# Patient Record
Sex: Male | Born: 1935
Health system: Southern US, Community
[De-identification: ages and names within clinical notes are randomized; demographics above are authoritative.]

## PROBLEM LIST (undated history)

## (undated) DIAGNOSIS — E7800 Pure hypercholesterolemia, unspecified: Secondary | ICD-10-CM

## (undated) DIAGNOSIS — C649 Malignant neoplasm of unspecified kidney, except renal pelvis: Secondary | ICD-10-CM

## (undated) DIAGNOSIS — I639 Cerebral infarction, unspecified: Secondary | ICD-10-CM

## (undated) DIAGNOSIS — K219 Gastro-esophageal reflux disease without esophagitis: Secondary | ICD-10-CM

## (undated) DIAGNOSIS — C801 Malignant (primary) neoplasm, unspecified: Secondary | ICD-10-CM

## (undated) DIAGNOSIS — C679 Malignant neoplasm of bladder, unspecified: Secondary | ICD-10-CM

## (undated) DIAGNOSIS — I10 Essential (primary) hypertension: Secondary | ICD-10-CM

## (undated) HISTORY — PX: NEPHRECTOMY: SHX65

---

## 1998-03-30 ENCOUNTER — Ambulatory Visit (HOSPITAL_COMMUNITY): Admission: RE | Admit: 1998-03-30 | Discharge: 1998-03-30 | Payer: Self-pay | Admitting: Urology

## 1998-03-30 ENCOUNTER — Encounter: Payer: Self-pay | Admitting: Urology

## 1998-05-18 ENCOUNTER — Inpatient Hospital Stay (HOSPITAL_COMMUNITY): Admission: RE | Admit: 1998-05-18 | Discharge: 1998-05-24 | Payer: Self-pay | Admitting: Urology

## 1998-05-24 ENCOUNTER — Encounter: Payer: Self-pay | Admitting: Urology

## 1998-11-02 ENCOUNTER — Encounter: Payer: Self-pay | Admitting: Urology

## 1998-11-02 ENCOUNTER — Ambulatory Visit (HOSPITAL_COMMUNITY): Admission: RE | Admit: 1998-11-02 | Discharge: 1998-11-02 | Payer: Self-pay | Admitting: Urology

## 1998-11-02 ENCOUNTER — Encounter (INDEPENDENT_AMBULATORY_CARE_PROVIDER_SITE_OTHER): Payer: Self-pay

## 1999-05-03 ENCOUNTER — Encounter (INDEPENDENT_AMBULATORY_CARE_PROVIDER_SITE_OTHER): Payer: Self-pay

## 1999-05-03 ENCOUNTER — Ambulatory Visit (HOSPITAL_COMMUNITY): Admission: RE | Admit: 1999-05-03 | Discharge: 1999-05-03 | Payer: Self-pay | Admitting: Hematology and Oncology

## 1999-05-03 ENCOUNTER — Encounter: Payer: Self-pay | Admitting: Urology

## 1999-07-27 ENCOUNTER — Other Ambulatory Visit: Admission: RE | Admit: 1999-07-27 | Discharge: 1999-07-27 | Payer: Self-pay | Admitting: Urology

## 1999-11-15 ENCOUNTER — Encounter (INDEPENDENT_AMBULATORY_CARE_PROVIDER_SITE_OTHER): Payer: Self-pay | Admitting: Specialist

## 1999-11-15 ENCOUNTER — Ambulatory Visit (HOSPITAL_COMMUNITY): Admission: RE | Admit: 1999-11-15 | Discharge: 1999-11-15 | Payer: Self-pay | Admitting: Urology

## 2000-10-02 ENCOUNTER — Encounter (INDEPENDENT_AMBULATORY_CARE_PROVIDER_SITE_OTHER): Payer: Self-pay

## 2000-10-02 ENCOUNTER — Encounter: Payer: Self-pay | Admitting: Urology

## 2000-10-02 ENCOUNTER — Ambulatory Visit (HOSPITAL_COMMUNITY): Admission: RE | Admit: 2000-10-02 | Discharge: 2000-10-02 | Payer: Self-pay | Admitting: Urology

## 2001-11-04 ENCOUNTER — Encounter: Payer: Self-pay | Admitting: Urology

## 2001-11-04 ENCOUNTER — Encounter: Admission: RE | Admit: 2001-11-04 | Discharge: 2001-11-04 | Payer: Self-pay | Admitting: Urology

## 2004-06-02 ENCOUNTER — Emergency Department (HOSPITAL_COMMUNITY): Admission: EM | Admit: 2004-06-02 | Discharge: 2004-06-02 | Payer: Self-pay | Admitting: Emergency Medicine

## 2004-06-06 ENCOUNTER — Ambulatory Visit (HOSPITAL_COMMUNITY): Admission: RE | Admit: 2004-06-06 | Discharge: 2004-06-06 | Payer: Self-pay | Admitting: Emergency Medicine

## 2004-07-06 ENCOUNTER — Ambulatory Visit: Payer: Self-pay | Admitting: Internal Medicine

## 2004-08-26 ENCOUNTER — Ambulatory Visit: Payer: Self-pay | Admitting: Internal Medicine

## 2005-02-21 ENCOUNTER — Ambulatory Visit (HOSPITAL_BASED_OUTPATIENT_CLINIC_OR_DEPARTMENT_OTHER): Admission: RE | Admit: 2005-02-21 | Discharge: 2005-02-21 | Payer: Self-pay | Admitting: Urology

## 2005-02-21 ENCOUNTER — Encounter (INDEPENDENT_AMBULATORY_CARE_PROVIDER_SITE_OTHER): Payer: Self-pay | Admitting: Specialist

## 2005-12-09 ENCOUNTER — Inpatient Hospital Stay (HOSPITAL_COMMUNITY): Admission: EM | Admit: 2005-12-09 | Discharge: 2005-12-14 | Payer: Self-pay | Admitting: Emergency Medicine

## 2005-12-09 ENCOUNTER — Ambulatory Visit: Payer: Self-pay | Admitting: Pulmonary Disease

## 2005-12-10 ENCOUNTER — Encounter: Payer: Self-pay | Admitting: Gastroenterology

## 2005-12-18 ENCOUNTER — Ambulatory Visit: Payer: Self-pay | Admitting: Gastroenterology

## 2005-12-26 ENCOUNTER — Ambulatory Visit: Payer: Self-pay | Admitting: Gastroenterology

## 2006-03-28 ENCOUNTER — Ambulatory Visit: Payer: Self-pay | Admitting: Gastroenterology

## 2006-05-20 ENCOUNTER — Emergency Department (HOSPITAL_COMMUNITY): Admission: EM | Admit: 2006-05-20 | Discharge: 2006-05-21 | Payer: Self-pay | Admitting: Emergency Medicine

## 2006-05-21 ENCOUNTER — Inpatient Hospital Stay (HOSPITAL_COMMUNITY): Admission: EM | Admit: 2006-05-21 | Discharge: 2006-05-26 | Payer: Self-pay | Admitting: Emergency Medicine

## 2007-09-09 ENCOUNTER — Encounter: Payer: Self-pay | Admitting: Gastroenterology

## 2007-10-31 DIAGNOSIS — C649 Malignant neoplasm of unspecified kidney, except renal pelvis: Secondary | ICD-10-CM | POA: Insufficient documentation

## 2007-10-31 DIAGNOSIS — C679 Malignant neoplasm of bladder, unspecified: Secondary | ICD-10-CM | POA: Insufficient documentation

## 2007-10-31 DIAGNOSIS — K280 Acute gastrojejunal ulcer with hemorrhage: Secondary | ICD-10-CM | POA: Insufficient documentation

## 2008-01-26 ENCOUNTER — Inpatient Hospital Stay (HOSPITAL_COMMUNITY): Admission: EM | Admit: 2008-01-26 | Discharge: 2008-01-28 | Payer: Self-pay | Admitting: Emergency Medicine

## 2008-01-26 ENCOUNTER — Encounter (INDEPENDENT_AMBULATORY_CARE_PROVIDER_SITE_OTHER): Payer: Self-pay | Admitting: Internal Medicine

## 2008-01-27 ENCOUNTER — Encounter (INDEPENDENT_AMBULATORY_CARE_PROVIDER_SITE_OTHER): Payer: Self-pay | Admitting: Internal Medicine

## 2008-07-18 ENCOUNTER — Emergency Department (HOSPITAL_COMMUNITY): Admission: EM | Admit: 2008-07-18 | Discharge: 2008-07-18 | Payer: Self-pay | Admitting: Emergency Medicine

## 2008-11-11 ENCOUNTER — Emergency Department (HOSPITAL_COMMUNITY): Admission: EM | Admit: 2008-11-11 | Discharge: 2008-11-11 | Payer: Self-pay | Admitting: Emergency Medicine

## 2008-11-12 ENCOUNTER — Encounter: Admission: RE | Admit: 2008-11-12 | Discharge: 2008-11-12 | Payer: Self-pay | Admitting: Family Medicine

## 2008-11-13 ENCOUNTER — Emergency Department (HOSPITAL_COMMUNITY): Admission: EM | Admit: 2008-11-13 | Discharge: 2008-11-13 | Payer: Self-pay | Admitting: Emergency Medicine

## 2008-11-17 ENCOUNTER — Inpatient Hospital Stay (HOSPITAL_COMMUNITY): Admission: EM | Admit: 2008-11-17 | Discharge: 2008-11-19 | Payer: Self-pay | Admitting: Emergency Medicine

## 2008-11-19 ENCOUNTER — Encounter (INDEPENDENT_AMBULATORY_CARE_PROVIDER_SITE_OTHER): Payer: Self-pay | Admitting: Internal Medicine

## 2009-01-02 ENCOUNTER — Emergency Department (HOSPITAL_COMMUNITY): Admission: EM | Admit: 2009-01-02 | Discharge: 2009-01-02 | Payer: Self-pay | Admitting: Emergency Medicine

## 2010-03-20 ENCOUNTER — Emergency Department (HOSPITAL_COMMUNITY): Payer: Medicare Other

## 2010-03-20 ENCOUNTER — Emergency Department (HOSPITAL_COMMUNITY)
Admission: EM | Admit: 2010-03-20 | Discharge: 2010-03-21 | Disposition: A | Discharge status: Home / Self Care | Payer: Medicare Other | Attending: Emergency Medicine | Admitting: Emergency Medicine

## 2010-03-20 DIAGNOSIS — E860 Dehydration: Secondary | ICD-10-CM | POA: Insufficient documentation

## 2010-03-20 DIAGNOSIS — Z85528 Personal history of other malignant neoplasm of kidney: Secondary | ICD-10-CM | POA: Insufficient documentation

## 2010-03-20 DIAGNOSIS — F29 Unspecified psychosis not due to a substance or known physiological condition: Secondary | ICD-10-CM | POA: Insufficient documentation

## 2010-03-20 DIAGNOSIS — R413 Other amnesia: Secondary | ICD-10-CM | POA: Insufficient documentation

## 2010-03-20 DIAGNOSIS — R0602 Shortness of breath: Secondary | ICD-10-CM | POA: Insufficient documentation

## 2010-03-20 DIAGNOSIS — Z8551 Personal history of malignant neoplasm of bladder: Secondary | ICD-10-CM | POA: Insufficient documentation

## 2010-03-20 DIAGNOSIS — R5381 Other malaise: Secondary | ICD-10-CM | POA: Insufficient documentation

## 2010-03-20 DIAGNOSIS — E7800 Pure hypercholesterolemia, unspecified: Secondary | ICD-10-CM | POA: Insufficient documentation

## 2010-03-20 DIAGNOSIS — Z9889 Other specified postprocedural states: Secondary | ICD-10-CM | POA: Insufficient documentation

## 2010-03-20 LAB — POCT CARDIAC MARKERS
CKMB, poc: 3.6 ng/mL (ref 1.0–8.0)
Myoglobin, poc: 216 ng/mL (ref 12–200)
Troponin i, poc: <0.05 ng/mL (ref 0.00–0.09)

## 2010-03-20 LAB — URINALYSIS, ROUTINE W REFLEX MICROSCOPIC
Bilirubin Urine: NEGATIVE
Ketones, ur: NEGATIVE mg/dL
Protein, ur: NEGATIVE mg/dL
Urine Glucose, Fasting: NEGATIVE mg/dL
pH: 6.5 (ref 5.0–8.0)

## 2010-03-20 LAB — CBC
Hemoglobin: 15.5 g/dL (ref 13.0–17.0)
MCH: 32.8 pg (ref 26.0–34.0)
MCHC: 36.5 g/dL — ABNORMAL HIGH (ref 30.0–36.0)
MCV: 90 fL (ref 78.0–100.0)
RBC: 4.72 MIL/uL (ref 4.22–5.81)

## 2010-03-20 LAB — BASIC METABOLIC PANEL
Calcium: 9.3 mg/dL (ref 8.4–10.5)
GFR calc Af Amer: 59 mL/min — ABNORMAL LOW (ref >60)
GFR calc non Af Amer: 49 mL/min — ABNORMAL LOW (ref >60)
Glucose, Bld: 115 mg/dL — ABNORMAL HIGH (ref 70–99)
Sodium: 130 mEq/L — ABNORMAL LOW (ref 135–145)

## 2010-03-20 LAB — DIFFERENTIAL
Eosinophils Absolute: 0.4 10*3/uL (ref 0.0–0.7)
Eosinophils Relative: 4 % (ref 0–5)
Lymphocytes Relative: 17 % (ref 12–46)

## 2010-03-24 ENCOUNTER — Other Ambulatory Visit: Payer: Self-pay | Admitting: Family Medicine

## 2010-03-24 DIAGNOSIS — R140 Abdominal distension (gaseous): Secondary | ICD-10-CM

## 2010-03-24 DIAGNOSIS — R42 Dizziness and giddiness: Secondary | ICD-10-CM

## 2010-03-24 DIAGNOSIS — R634 Abnormal weight loss: Secondary | ICD-10-CM

## 2010-03-24 DIAGNOSIS — R531 Weakness: Secondary | ICD-10-CM

## 2010-03-25 ENCOUNTER — Ambulatory Visit
Admission: RE | Admit: 2010-03-25 | Discharge: 2010-03-25 | Disposition: A | Discharge status: Home / Self Care | Payer: Medicare Other | Source: Ambulatory Visit | Attending: Family Medicine | Admitting: Family Medicine

## 2010-03-25 DIAGNOSIS — R531 Weakness: Secondary | ICD-10-CM

## 2010-03-25 DIAGNOSIS — R140 Abdominal distension (gaseous): Secondary | ICD-10-CM

## 2010-03-25 DIAGNOSIS — R42 Dizziness and giddiness: Secondary | ICD-10-CM

## 2010-03-25 DIAGNOSIS — R634 Abnormal weight loss: Secondary | ICD-10-CM

## 2010-03-25 MED ORDER — IOHEXOL 300 MG/ML  SOLN
75.0000 mL | Freq: Once | INTRAMUSCULAR | Status: AC | PRN
  Administered 2010-03-25: 75 mL via INTRAVENOUS

## 2010-04-13 ENCOUNTER — Emergency Department (HOSPITAL_COMMUNITY)
Admission: EM | Admit: 2010-04-13 | Discharge: 2010-04-13 | Disposition: A | Discharge status: Home / Self Care | Payer: Medicare Other | Attending: Emergency Medicine | Admitting: Emergency Medicine

## 2010-04-13 ENCOUNTER — Emergency Department (HOSPITAL_COMMUNITY): Payer: Medicare Other

## 2010-04-13 DIAGNOSIS — M545 Low back pain, unspecified: Secondary | ICD-10-CM | POA: Insufficient documentation

## 2010-04-13 DIAGNOSIS — E7800 Pure hypercholesterolemia, unspecified: Secondary | ICD-10-CM | POA: Insufficient documentation

## 2010-04-13 DIAGNOSIS — W19XXXA Unspecified fall, initial encounter: Secondary | ICD-10-CM | POA: Insufficient documentation

## 2010-04-13 DIAGNOSIS — S7000XA Contusion of unspecified hip, initial encounter: Secondary | ICD-10-CM | POA: Insufficient documentation

## 2010-04-13 DIAGNOSIS — Y92009 Unspecified place in unspecified non-institutional (private) residence as the place of occurrence of the external cause: Secondary | ICD-10-CM | POA: Insufficient documentation

## 2010-04-13 DIAGNOSIS — R079 Chest pain, unspecified: Secondary | ICD-10-CM | POA: Insufficient documentation

## 2010-04-13 LAB — URINALYSIS, ROUTINE W REFLEX MICROSCOPIC
Glucose, UA: NEGATIVE mg/dL
Specific Gravity, Urine: 1.012 (ref 1.005–1.030)
pH: 5.5 (ref 5.0–8.0)

## 2010-05-11 LAB — DIFFERENTIAL
Basophils Relative: 0 % (ref 0–1)
Eosinophils Absolute: 0.1 10*3/uL (ref 0.0–0.7)
Monocytes Absolute: 0.7 10*3/uL (ref 0.1–1.0)
Monocytes Relative: 7 % (ref 3–12)
Neutrophils Relative %: 84 % — ABNORMAL HIGH (ref 43–77)

## 2010-05-11 LAB — URINALYSIS, ROUTINE W REFLEX MICROSCOPIC
Bilirubin Urine: NEGATIVE
Hgb urine dipstick: NEGATIVE
Protein, ur: NEGATIVE mg/dL
Urobilinogen, UA: 0.2 mg/dL (ref 0.0–1.0)

## 2010-05-11 LAB — COMPREHENSIVE METABOLIC PANEL
ALT: 19 U/L (ref 0–53)
Alkaline Phosphatase: 67 U/L (ref 39–117)
Chloride: 96 mEq/L (ref 96–112)
Glucose, Bld: 113 mg/dL — ABNORMAL HIGH (ref 70–99)
Potassium: 4.3 mEq/L (ref 3.5–5.1)
Sodium: 130 mEq/L — ABNORMAL LOW (ref 135–145)
Total Bilirubin: 0.9 mg/dL (ref 0.3–1.2)
Total Protein: 7.4 g/dL (ref 6.0–8.3)

## 2010-05-11 LAB — HEMOCCULT GUIAC POC 1CARD (OFFICE): Fecal Occult Bld: NEGATIVE

## 2010-05-11 LAB — CBC
Hemoglobin: 14.9 g/dL (ref 13.0–17.0)
RBC: 4.37 MIL/uL (ref 4.22–5.81)
RDW: 13.5 % (ref 11.5–15.5)
WBC: 10.4 10*3/uL (ref 4.0–10.5)

## 2010-05-12 LAB — URINE CULTURE: Colony Count: NO GROWTH

## 2010-05-12 LAB — URINALYSIS, ROUTINE W REFLEX MICROSCOPIC
Bilirubin Urine: NEGATIVE
Hgb urine dipstick: NEGATIVE
Ketones, ur: NEGATIVE mg/dL
Ketones, ur: NEGATIVE mg/dL
Nitrite: NEGATIVE
Specific Gravity, Urine: 1.005 (ref 1.005–1.030)
Specific Gravity, Urine: 1.007 (ref 1.005–1.030)
Urobilinogen, UA: 0.2 mg/dL (ref 0.0–1.0)
Urobilinogen, UA: 0.2 mg/dL (ref 0.0–1.0)
pH: 6 (ref 5.0–8.0)
pH: 6.5 (ref 5.0–8.0)

## 2010-05-12 LAB — COMPREHENSIVE METABOLIC PANEL
ALT: 25 U/L (ref 0–53)
AST: 28 U/L (ref 0–37)
Albumin: 3.3 g/dL — ABNORMAL LOW (ref 3.5–5.2)
Alkaline Phosphatase: 71 U/L (ref 39–117)
BUN: 9 mg/dL (ref 6–23)
CO2: 25 mEq/L (ref 19–32)
Chloride: 98 mEq/L (ref 96–112)
Creatinine, Ser: 1.24 mg/dL (ref 0.4–1.5)
GFR calc Af Amer: >60 mL/min (ref >60)
GFR calc non Af Amer: 56 mL/min — ABNORMAL LOW (ref >60)
Glucose, Bld: 149 mg/dL — ABNORMAL HIGH (ref 70–99)
Potassium: 3.6 mEq/L (ref 3.5–5.1)
Sodium: 128 mEq/L — ABNORMAL LOW (ref 135–145)
Total Bilirubin: 0.8 mg/dL (ref 0.3–1.2)
Total Protein: 6.5 g/dL (ref 6.0–8.3)

## 2010-05-12 LAB — CK TOTAL AND CKMB (NOT AT ARMC)
CK, MB: 3.5 ng/mL (ref 0.3–4.0)
CK, MB: 4.5 ng/mL — ABNORMAL HIGH (ref 0.3–4.0)
Relative Index: INVALID (ref 0.0–2.5)
Total CK: 63 U/L (ref 7–232)
Total CK: 66 U/L (ref 7–232)

## 2010-05-12 LAB — DIFFERENTIAL
Basophils Absolute: 0 10*3/uL (ref 0.0–0.1)
Basophils Relative: 0 % (ref 0–1)
Eosinophils Absolute: 0.1 10*3/uL (ref 0.0–0.7)
Eosinophils Relative: 1 % (ref 0–5)
Eosinophils Relative: 1 % (ref 0–5)
Lymphocytes Relative: 7 % — ABNORMAL LOW (ref 12–46)
Lymphs Abs: 1.1 10*3/uL (ref 0.7–4.0)
Monocytes Absolute: 0.8 10*3/uL (ref 0.1–1.0)
Monocytes Relative: 8 % (ref 3–12)
Neutro Abs: 6.6 10*3/uL (ref 1.7–7.7)

## 2010-05-12 LAB — CBC
HCT: 36.6 % — ABNORMAL LOW (ref 39.0–52.0)
HCT: 42.9 % (ref 39.0–52.0)
Hemoglobin: 12.8 g/dL — ABNORMAL LOW (ref 13.0–17.0)
Hemoglobin: 12.9 g/dL — ABNORMAL LOW (ref 13.0–17.0)
Hemoglobin: 15.1 g/dL (ref 13.0–17.0)
MCHC: 34.9 g/dL (ref 30.0–36.0)
MCV: 97.2 fL (ref 78.0–100.0)
Platelets: 160 10*3/uL (ref 150–400)
Platelets: 199 10*3/uL (ref 150–400)
RBC: 3.77 MIL/uL — ABNORMAL LOW (ref 4.22–5.81)
RBC: 3.78 MIL/uL — ABNORMAL LOW (ref 4.22–5.81)
RBC: 4.42 MIL/uL (ref 4.22–5.81)
RDW: 12.9 % (ref 11.5–15.5)
WBC: 7.1 10*3/uL (ref 4.0–10.5)
WBC: 7.9 10*3/uL (ref 4.0–10.5)

## 2010-05-12 LAB — CORTISOL: Cortisol, Plasma: 9.2 ug/dL

## 2010-05-12 LAB — BASIC METABOLIC PANEL
BUN: 10 mg/dL (ref 6–23)
CO2: 24 mEq/L (ref 19–32)
Calcium: 8.5 mg/dL (ref 8.4–10.5)
GFR calc Af Amer: 59 mL/min — ABNORMAL LOW (ref >60)
GFR calc Af Amer: >60 mL/min (ref >60)
GFR calc non Af Amer: 49 mL/min — ABNORMAL LOW (ref >60)
Potassium: 3.8 mEq/L (ref 3.5–5.1)
Sodium: 132 mEq/L — ABNORMAL LOW (ref 135–145)
Sodium: 132 mEq/L — ABNORMAL LOW (ref 135–145)

## 2010-05-12 LAB — STOOL CULTURE

## 2010-05-12 LAB — POCT CARDIAC MARKERS: CKMB, poc: 2.9 ng/mL (ref 1.0–8.0)

## 2010-05-12 LAB — TROPONIN I
Troponin I: 0.02 ng/mL (ref 0.00–0.06)
Troponin I: 0.04 ng/mL (ref 0.00–0.06)

## 2010-05-12 LAB — CLOSTRIDIUM DIFFICILE EIA

## 2010-05-12 LAB — VITAMIN B12: Vitamin B-12: 412 pg/mL (ref 211–911)

## 2010-05-16 LAB — POCT I-STAT, CHEM 8
BUN: 20 mg/dL (ref 6–23)
Chloride: 102 mEq/L (ref 96–112)
Creatinine, Ser: 1.8 mg/dL — ABNORMAL HIGH (ref 0.4–1.5)
Glucose, Bld: 139 mg/dL — ABNORMAL HIGH (ref 70–99)
Potassium: 4 mEq/L (ref 3.5–5.1)

## 2010-06-21 NOTE — Procedures (Signed)
CLINICAL HISTORY:  An 77 year old male admitted for generalized  weakness, the patient had altered mental status, had ambulating  difficulty along with confusion over the last few days, CT showed small  vessel disease.   CURRENT MEDICATIONS:  Lovenox, aspirin, Protonix, Norvasc, Avelox,  Zofran, Klonopin.   TECHNICAL COMMENT:  An 18-channel EEG was performed based on standard  international 10-20 system.  Total recording time 21.8 minutes, 17th  channel dedicated to EKG which demonstrate normal sinus rhythm of 78  beats per minute.   Upon awakening, the posterior background activity was diffusely low  amplitude 10 Hz, reactive to eye opening and closure.  There was no  evidence of epileptiform discharge.  Photic stimulation with flash  frequency 1-19 Hz was performed.  There was no abnormality elicited.   The patient was drowsy during recording, as evident by attenuation of  the background activity, and sleep spindles.  There was increased  appearance of F7 sharp transient.  Occasionally, the sharp transient  will also spread to adjacent leads, but there was no typical  epileptiform discharge recorded.   In conclusion, this is a normal awake and asleep EEG.  There is no  evidence of epileptiform discharge, F7 sharp transient of unknown  clinical significance.      Levert Feinstein, MD  Electronically Signed     [id number]  D:  01/27/2008 15:33:59  T:  01/28/2008 04:54:09  Job #:  [id number]

## 2010-06-21 NOTE — H&P (Signed)
NAME:  [name], [name]                  ACCOUNT NO.:  [id number]   MEDICAL RECORD NO.:  [id number]          PATIENT TYPE:  EMS   LOCATION:  MAJO                         FACILITY:  [facility]   PHYSICIAN:  Eduard Clos, MDDATE OF BIRTH:  01/23/1924   DATE OF ADMISSION:  01/26/2008  DATE OF DISCHARGE:                              HISTORY & PHYSICAL   PRIMARY CARE PHYSICIAN:  Manson Passey at [facility],  [place].   CHIEF COMPLAINT:  Generalized weakness and slurred speech.   HISTORY OF PRESENT ILLNESS:  An 77 year old man with a history of renal  cell carcinoma status post right-sided nephrectomy, history of bladder  cell CA in remission, history of massive GI bleed 2 years ago, who  presents with complaints of generalized weakness as noted by his family.  In addition, the patient was also noticed to be having slurred speech.  The patient has also been having difficulty walking with incontinence of  urine.   On examination in the ER the patient has full strength 5/5 but both  upper and lower extremities with no dysdiadochokinesia or ataxia.  The  patient states he has been able to swallow.  He has no facial asymmetry.  The patient did notice some headache a week ago when he went to his  primary care physician.  He was given amlodipine for high blood  pressure. In the primary care physician's office his systolic BP was  noted to be 190.  Presently in the ER his blood pressure is around 140.  The patient denies any chest pain, palpitations, shortness of breath,  diaphoresis, nausea or vomiting, diarrhea, fevers or chills.  He denies  any loss of consciousness, but he does have incontinence as explained  earlier.  The patient is able to control his urine, but he states that  over the last 2 days he has been having incontinence more than usual.   PAST MEDICAL HISTORY:  History of massive GI bleed 2 years ago when he  was found to have duodenal ulcer.  History of renal cell  carcinoma  status post right-sided nephrectomy and history of bladder cell  carcinoma.   PAST SURGICAL HISTORY:  Right-sided nephrectomy for renal cell  carcinoma, cholecystectomy, and cauterization for massive GI bleed 2  years ago in the duodenal area.   MEDICATIONS ON ADMISSION:  Omeprazole 40 mg p.o. daily, Klonopin 0.5 mg  p.o. daily p.r.n. for anxiety, and amlodipine 5 mg p.o. daily which was  recently started for blood pressure.   ALLERGIES:  MORPHINE.   FAMILY HISTORY:  Noncontributory.   SOCIAL HISTORY:  The patient lives with family.  Denies smoking  cigarettes, drinking alcohol, or using illicit drugs.   REVIEW OF SYSTEMS:  As per the history of present illness.  Nothing else  significant.   PHYSICAL EXAMINATION:  GENERAL APPEARANCE: The patient is examined at  bedside, not in acute distress.  VITAL SIGNS: Blood pressure 158/88, pulse 88 per  minute, temperature  97.6, respirations 18, O2 sat 97%.  HEENT:  Anicteric. No pallor.  Tongue is midline.  PERRLA.  No facial  asymmetry.  No neck rigidity.  CHEST:  Bilateral air entry, no rhonchi, no crepitations.  HEART:  S1 and S2 heard.  ABDOMEN: Soft and nontender.  Bowel sounds heard.  CNS:  Awake and alert, oriented to time, place, and person.  Moves upper  and lower extremities 5/5.  There is no ataxia or dysdiadochokinesia.  No pronator drift.  Sensation grossly intact.  EXTREMITIES:  Peripheral pulses felt. No edema.   LABORATORY RESULTS:  CT scan of the head with no acute intracranial  abnormality.  Metastatic disease to the brain cannot be excluded in the  absence of intravenous contrast, proliferation of probable chronic small  vessel disease since 2006. CBC white blood cell count 8.1, hemoglobin  14.7, hematocrit 43.2, platelets 142, neutrophils 72%.  PT and INR 13.7  and 1.  Comprehensive metabolic panel sodium 136, potassium 3.9,  chloride 101, carbon dioxide 27, glucose 134, BUN 22, creatinine 1.4.  Total  bilirubin 0.5, alkaline phosphatase 57, AST 27, ALT 30, total  protein 7.1, albumin 3.6, calcium 8.9, ammonia 26, lipase 28. Urinalysis  is negative for nitrites, leukocytes, ketones and blood.   Chest x-ray chronic lung disease with interval increasing bibasilar  predominant interstitial opacities.  The differential consideration  includes atelectasis, pulmonary vascular condition, or atypical  infection.   ASSESSMENT:  1. Generalized weakness with slurred speech and difficulty walking,      possible cerebrovascular accident.  2. History of bladder cancer and renal cell cancer status post right-      sided nephrectomy.  3. History of gastrointestinal bleed.  4. Recently diagnosed hypertension.   PLAN:  Will admit the patient to telemetry. Will get MRI/MRA of the  brain, a 2-D echocardiogram, transcranial Doppler, EKG, and will also  get a CT of the chest for the abnormal chest x-ray.  Further evaluation  as condition evolves. I have already discussed with Dr. Roseanne Reno, who  advised the patient to be on at least 81 mg of aspirin, get a swallow  evaluation before starting anything p.o. and further recommendations as  condition evolves.      Eduard Clos, MD  Electronically Signed     ANK/MEDQ  D:  01/26/2008  T:  01/26/2008  Job:  [id number]

## 2010-06-21 NOTE — Consult Note (Signed)
[name], [name]                  ACCOUNT NO.:  [id number]   MEDICAL RECORD NO.:  [id number]          PATIENT TYPE:  INP   LOCATION:  6708                         FACILITY:  [facility]   PHYSICIAN:  Noel Christmas, MD    DATE OF BIRTH:  1923-09-12   DATE OF CONSULTATION:  DATE OF DISCHARGE:                                 CONSULTATION   REFERRING PHYSICIAN:  E. Team   REASON FOR CONSULTATION:  Altered mental status and gait instability.   This is an 77 year old man who apparently has complained of weakness for  about 1 week, but had a precipitous deterioration in his status over the  last 48 hours.  He was started on clonazepam presumably for anxiety on  01/21/2008.  There has been no other changes in medication.  The patient  has not fallen, but has required assistance for stability with walking.  He has been unable to make to the bathroom on time and has urinated on  himself as well as on the floor of his house and on the floor in the  emergency room after arrival.  The patient is aware of when he needs to  go to the bathroom, however.  Family members have described short term  memory difficulty intermittently.  Overall, he has not appeared sharp as  usual over the past few days.  No focal abnormalities have been noted.  CT of his head was unremarkable with no signs of acute intracranial  abnormality.  Chronic small vessel changes were noted.  His blood counts  were normal except for slightly low white count.  Electrolytes were  unremarkable with no signs of significant metabolic abnormality.  The  patient is afebrile as well.   Past medical history remarkable for right lower lobe pneumonia in April  2008, GERD, status post nephrectomy for renal cell carcinoma, and  chronic lung disease, unclear type.  The patient also has hypertension.   MEDICATIONS:  1. Amlodipine 5 mg per day.  2. Clonazepam 0.5 mg p.r.n.  3. Prilosec 40 mg per day.   FAMILY HISTORY:  Noncontributory.   PHYSICAL EXAMINATION:  GENERAL:  Appearance was that of an elderly man  of medium built who was alert and cooperative and in no acute distress.  He was well oriented to time as well as place.  Short term and long term  memory were normal.  Affect was appropriate.  HEENT:  Pupils were equal and reactive normally to light.  Extraocular  movements were full and conjugate.  Visual fields were intact and  normal.  There was no facial weakness no facial numbness.  Hearing was  normal.  Speech and palatal movement were normal.  NEUROLOGY:  Coordination of his extremities is normal except for mild  intention tremor.  He also had mild tremor involving his head.  He had  no tremor at rest of his extremities.  Muscle tone was normal throughout  as well as motor strength.  Deep tendon reflexes were normal and  symmetrical except for absent ankle reflexes.  Plantar responses were  flexor.  Sensory exam was  normal.  Carotid auscultations normal.   CLINICAL IMPRESSION:  1. Altered mental status of unclear etiology, but likely at least some      part related to treatment with clonazepam.  2. Cannot rule out early dementia with exacerbation of mental status      with clonazepam.  3. No clinical signs of an acute stroke nor any signs of an acute      intracranial abnormality for CT scan.  4. No indications of metabolic encephalopathy.   RECOMMENDATIONS:  1. Discontinue clonazepam.  2. EEG in the a.m. to rule out slowing of cerebral activity.  3. MRI of the brain without and with contrast.  4. Vitamin B12 and folate levels.  5. RPR.  6. Physical therapy evaluation with the patient's gait.   Thank you for asking me to evaluate Mr. Briles.      Noel Christmas, MD  Electronically Signed     CS/MEDQ  D:  01/26/2008  T:  01/27/2008  Job:  [id number]

## 2010-06-21 NOTE — Discharge Summary (Signed)
NAMEMARLIN, [name]                  ACCOUNT NO.:  [id number]   MEDICAL RECORD NO.:  [id number]          PATIENT TYPE:  INP   LOCATION:  6708                         FACILITY:  [facility]   PHYSICIAN:  Ladell Pier, M.D.   DATE OF BIRTH:  02/04/1924   DATE OF ADMISSION:  01/26/2008  DATE OF DISCHARGE:  01/28/2008                               DISCHARGE SUMMARY   DISCHARGE DIAGNOSES:  1. Weakness.  2. Slurred speech.  3. Hypertension.  4. Dyslipidemia.  5. Mild thrombocytopenia.  6. Pneumonia.  7. Pulmonary nodule on chest x-ray.  Followup CT 3-6 months      recommended.  8. History of massive gastrointestinal bleed 2 years ago secondary to      duodenal ulcer.  9. History of renal cell cancer, status post right-sided nephrectomy.  10.History of bladder cell cancer.   PAST SURGICAL HISTORY:  1. Status post right side nephrectomy for renal cell cancer.  2. Status post cholecystectomy.  3. History of cauterization for massive GI bleed 2 years ago secondary      to duodenal ulcer.   PROCEDURES:  None.   CONSULTANTS:  Neurology.   HISTORY OF PRESENT ILLNESS:  The patient is an 77 year old white male  with history of renal cell cancer, status post right nephrectomy,  history of bladder cancer in remission, history of massive GI bleed 2  years ago, presented with complaints of generalized weakness and slurred  speech.  The patient has been having difficulty walking with some  incontinence of urine.  Please see admission note for remainder of  history.   PAST MEDICAL HISTORY/FAMILY HISTORY/SOCIAL  HISTORY/MEDICATIONS/ALLERGIES/REVIEW OF SYSTEMS:  Per admission H&P.   PHYSICAL EXAMINATION ON DISCHARGE:  VITAL SIGNS:  Temperature 97.5,  pulse of 92, respirations 18, blood pressure 164/80, and pulse of 100%  on room air.  GENERAL:  The patient is lying in bed, does not seem to be in any acute  distress.  HEENT:  Head is normocephalic and atraumatic.  Pupils are reactive to  light,  but without erythema.  CARDIOVASCULAR:  Regular rate and rhythm.  LUNGS:  Clear bilaterally.  ABDOMEN:  Positive bowel sounds.  EXTREMITIES:  Without edema.  NEUROLOGIC:  Nonfocal.   HOSPITAL COURSE:  1. Weakness/slurred speech.  The patient was admitted to the hospital,      had a CT scan of the head that was negative.  Neurology was      consulted, ordered MRI/MRA, which were both negative.  The MRA      showed no ICA stenosis, although the carotid Dopplers did show 40-      60% ICA stenosis on the left.  The patient does not have any      symptoms.  He can follow up outpatient with his primary care      physician regarding this.  He also has a followup appointment with      Neurology.  He had an EEG done that was normal.  He had B12 and      folate levels and RPR that was normal, discussed this with his  daughter and also with the patient.  2. We will discharge him on aspirin 81 mg per day.  He will continue      taking omeprazole.  If any signs of bleeding, told to discontinue      the aspirin and follow up with primary care doctor.  He will,      however, follow up with his primary care doctor in a week and with      Dr. Anne Hahn in about 4 weeks.  The Klonopin was discontinued per      Neurology secondary to the patient's symptoms.  3. Hypertension.  The patient will continue the amlodipine at 5 mg      daily.  4. Dyslipidemia.  The patient had cholesterol done.  His triglyceride      was elevated at 555 and his HDL was 26.  Started him on TriCor and      Lovaza.  The patient will follow up with primary care physician to      follow up on his cholesterol.  5. Mild elevation in glucose.  The patient's glucose was running in      the high 120s.  Hemoglobin A1c 6.1.  Follow up for diabetes      screening with primary care physician.  6. Mild thrombocytopenia.  Platelet was slightly low, not sure what      his baseline is, but he could follow up as an outpatient with his       primary care physician.  7. Question of pneumonia.  He did have pneumonia on the chest x-ray,      although the CAT scan did not show any signs of pneumonia.  We will      put him on Avelox for 6 more days, then discontinue.   DISCHARGE LABORATORY DATA:  Folate 1050.  Urine culture negative.  Homocysteine 12.5, vitamin B12 of 403, RPR negative, and hemoglobin A1c  6.1.  Cardiac markers negative.  Blood cultures negative x2.  Sodium  135, potassium 3.6, chloride 103, CO2 of 23, glucose 123, BUN 22, and  creatinine 1.45.  TSH of 0.530.  Lipid panel; total cholesterol of 177,  triglycerides 555, HDL of 26, LDL could not be calculated.  MRI/MRA  negative for acute stroke.  CT scan of the chest showed a subcentimeter  0.7 cm right lower lobe pulmonary nodule.   RECOMMENDATION:  To follow up CT scan in 3-6 months.  Head CT negative.  Chest x-ray showed chronic lung disease, interval increased basilar  predominant interstitial opacities.  Consider pneumonia or atypical  infection.      Ladell Pier, M.D.  Electronically Signed     NJ/MEDQ  D:  01/28/2008  T:  01/29/2008  Job:  [id number]   cc:   [facility]  Marlan Palau, M.D.

## 2010-06-24 NOTE — Procedures (Signed)
[facility]  Patient:    [name], [name]                           MRN: [id number] Proc. Date: 11/15/99 Adm. Date:  [id number] Attending:  Londell Moh CC:         Elvina Sidle, M.D.   Procedure Report  PREOPERATIVE DIAGNOSIS:  Recurrent superficial transitional cell carcinoma of the bladder.  POSTOPERATIVE DIAGNOSIS:  Recurrent superficial transitional cell carcinoma of the bladder.  OPERATION:  Cystoscopy, bladder biopsy, and multiple fulgurations.  SURGEON:  Jamison Neighbor, M.D.  ANESTHESIA:  General.  COMPLICATIONS:  None.  DRAIN:  None.  BRIEF HISTORY:  This 77 year old male underwent a TURB and was found to have noninvasive carcinoma of the bladder.  He underwent followup BCG and subsequent Valstar.  He has had one recurrence after the initial procedure. Cystoscopy after the Valstar administration showed several very small tumors, 6 or 7 in number, scattered throughout the bladder, all of which appeared to be quite superficial.  The patient requires biopsy and fulguration of these. He understands the risks and benefits of the procedure and gave full and informed consent.  DESCRIPTION OF PROCEDURE:  After successful induction of general anesthesia, the patient was placed in the dorsolithotomy position, prepped with Betadine, and draped in the usual sterile fashion.  Cystoscopy was performed initially with the 12 degree and later with 7 degree lens.  The patient was found to have lesions throughout the bladder.  Some of these were posteriorly, and three of these were up near the air bubble.  There were none in the areas of previous resection.  All of these were quite small, very superficial, and noninvasive in appearance.  Representative sections in the area were biopsied with a cold cut biopsy and in several cases appeared to removed most of the lesion.  The entire area and some of the surrounding normal-appearing tissue was  fulgurated until all abnormal tissue was removed.  It was felt there was adequate biopsy material in order to ensure there was nothing muscle invasive. These were all exceptionally superficial.  The patient tolerated the procedure well and was taken to the recovery room in good condition.  The patient will return to the office in followup.  We will check an IVP in order to check his upper tract to make sure he has no evidence of recurrence within the ureters. DD:  11/15/99 TD:  11/16/99 Job: [id number]

## 2010-06-24 NOTE — Consult Note (Signed)
NAMEJOSAFAT, [name]                  ACCOUNT NO.:  [id number]   MEDICAL RECORD NO.:  [id number]          PATIENT TYPE:  INP   LOCATION:  2104                         FACILITY:  [facility]   PHYSICIAN:  Coralyn Helling, MD        DATE OF BIRTH:  1924/01/04   DATE OF CONSULTATION:  12/10/2005  DATE OF DISCHARGE:                                   CONSULTATION   REFERRING PHYSICIAN:  Dr. Melvia Heaps.   REASON FOR CONSULTATION:  Hypovolemic shock.   [name] is a 77 year old male who was admitted on November 3rd after  having melenic stools and then had frank hematemesis and then subsequent  loss of consciousness.  He subsequently fell and sustained a laceration to  his right eyebrow. He was on aspirin on a daily basis.  He had undergone EGD  which revealed a duodenal ulcer as the source of his bleeding which was  injected with epinephrine.  He had also received several units of packed red  blood cells.  Earlier in the day, he again developed hematemesis as well as  melenic stools.  He became acutely hypotensive as well as tachycardiac and  Critical Care consultation was requested.  He currently denies any symptoms  of chest pains or difficulty breathing.  He also denies abdominal pain.  He  is having some feelings of nausea as well as urge to have a bowel movement.  He also is complaining of some dizziness.   PAST MEDICAL HISTORY:  Significant for transitional cell carcinoma of the  bladder.   FAMILY HISTORY:  Noncontributory.   NO KNOWN DRUG ALLERGIES.   SOCIAL HISTORY:  He is retired.  He is widowed.  He lives with his daughter.  There is no significant history of tobacco or alcohol use.   MEDICATIONS CURRENTLY:  Protonix, Ambien, Tylenol and Zofran.   REVIEW OF SYSTEMS:  Essentially negative except what is stated above.   PHYSICAL EXAM:  He is seen in the intensive care unit.  Blood pressure is  80/40, heart rate was 106, respiratory rate was 18, oxygen saturation was  96%.  HEENT:  He has pale sclerae.  He has ecchymosis around his right eye with a  suture laceration.  There are no oral lesions.  There is no lymphadenopathy,  no thyromegaly.  HEART:  With S1-S2, tachycardiac.  CHEST:  There were no wheezing or rales.  ABDOMEN:  Was thin, soft, increased bowel sounds but nontender.  No masses.  EXTREMITIES:  There was no edema, cyanosis, clubbing.  NEUROLOGIC EXAM:  He had a fine resting tremor but, otherwise, was awake,  alert and oriented.  No other focal deficits appreciated.   MOST RECENT LABORATORY TESTS SHOW:  Hemoglobin of 8.9, hematocrit is 25.1.  WBCs 11.3, platelet count is 145,000, sodium is 141, potassium is 4.5,  chloride is 110, CO2 is 28, BUN is 48, creatinine is 1.4, glucose 123, AST  was 30, ALT is 22, ALP is 56, bilirubin 0.7, albumin is 2.7, calcium is 7.9.  Chest x-ray showed changes suggestive of chronic bronchitis with minimal  calcified pleural plaque disease.   IMPRESSION:  1. This is an 77 year old male who presents with recurrent upper      gastrointestinal bleeding from a duodenal ulcer. He is acutely      hypertensive and tachycardiac consistent with hypovolemic shock.  I      agree with continued volume resuscitation with packed red blood cells      as well as normal saline.  He is also thrombocytopenic. Will check a      disseminated intravascular coagulation profile and then determine if he      would also require transfusions with fresh frozen plasma as well as      possibly platelets. He is due to have repeat esophagogastroduodenoscopy      and then, depending upon results of this, surgical evaluation is also      pending. I will also place a central venous pressure line to assist      with volume resuscitation as well as monitoring fluid status.  2. Pleural calcifications on chest x-ray.  This was seen on previous      computerized tomography scan of the chest from Jun 06, 2004, which would      be consistent with previous  asbestos exposure.  However, he appears to      be fairly symptom free from pulmonary disease at the present time.  3. Right eye laceration.  4. Transitional cell carcinoma.      Coralyn Helling, MD  Electronically Signed     VS/MEDQ  D:  12/11/2005  T:  12/12/2005  Job:  [id number]

## 2010-06-24 NOTE — H&P (Signed)
NAMEJACOBO, [name]                  ACCOUNT NO.:  [id number]   MEDICAL RECORD NO.:  [id number]          PATIENT TYPE:  INP   LOCATION:  2104                         FACILITY:  [facility]   PHYSICIAN:  Barbette Hair. Arlyce Dice, MD,FACGDATE OF BIRTH:  [birth year]   DATE OF ADMISSION:  12/09/2005  DATE OF DISCHARGE:                                HISTORY & PHYSICAL   PROBLEM:  Loss of consciousness and hematemesis.   HISTORY:  [name] is a pleasant 77 year old white male admitted with  syncope.  Over the last couple of days, he has noticed dark melenic stools.  Today, he had frank hematemesis and passed a bloody stool.  This was  followed by a short loss of consciousness.  He fell and sustained a  laceration to his right eyebrow.  He was brought to the emergency room.  He  takes one aspirin daily.  He denies NSAID use.  He is not a drinker.  He has  no history of ulcer disease.  He denies abdominal pain, prior change in his  bowel habits prior to his bowel habits, dysphagia, or pyrosis.   PAST MEDICAL HISTORY:  Pertinent transitional cell CA of the bladder for  which he has undergone local therapy with chemotherapeutic washing of his  bladder.   FAMILY HISTORY:  Noncontributory.   MEDICATIONS:  He takes no medications.   ALLERGIES:  He has no allergies.   SOCIAL HISTORY:  He neither smokes or drinks.  Retired, widowed, and lives  with his daughter.   REVIEW OF SYSTEMS:  Review of systems was reviewed and is negative.   PHYSICAL EXAMINATION:  VITAL SIGNS:  Pulse 96, blood pressure 116/61,  respiratory rate 20.  HEENT:  He is anicteric.  There is a small laceration over his right eyelid  with a hematoma in the right orbit.  HEENT exam is otherwise within normal  limits without lymphadenopathy.  CHEST:  Clear.  CARDIAC:  There are no cardiac murmurs, gallops, or rubs.  ABDOMEN:  Without masses, tenderness, or organomegaly.  RECTAL:  Deferred.  EXTREMITIES:  There is no cyanosis, clubbing,  or edema of his extremities.   LABORATORY DATA:  Hemoglobin 11.6, hematocrit 33, MCV 95, white count 11.3,  platelet count 145.  Sodium 140, potassium 4.5, chloride 106, carbon dioxide  28, BUN 27, and creatinine 1.5, glucose 170.   IMPRESSION:  1. Acute upper gastrointestinal bleed with secondary syncope.  I suspect      that he is bleeding from active peptic ulcer disease.  However,      consider esophagitis and gastritis are less likely as are gastric      arteriovenous malformations.  Gastric neoplasm must also be considered.      There is no stigmata of liver disease to suggest esophageal varices.  2. Bladder cancer.   RECOMMENDATIONS:  1. Aggressive IV hydration.  2. IV Protonix.  3. Keep hemoglobin at 8 to 9.  4. Upper endoscopy.      Barbette Hair. Arlyce Dice, MD,FACG  Electronically Signed     RDK/MEDQ  D:  12/09/2005  T:  12/10/2005  Job:  [id number]

## 2010-06-24 NOTE — Discharge Summary (Signed)
[name], [name]                  ACCOUNT NO.:  [id number]   MEDICAL RECORD NO.:  [id number]          PATIENT TYPE:  INP   LOCATION:  6742                         FACILITY:  [facility]   PHYSICIAN:  Hedwig Morton. Juanda Chance, MD     DATE OF BIRTH:  24-Feb-1923   DATE OF ADMISSION:  12/09/2005  DATE OF DISCHARGE:  12/14/2005                                 DISCHARGE SUMMARY   ADMITTING DIAGNOSES:  1. Upper GI bleed associated with volume collapse and syncope.  Probably      bleeding from active peptic ulcer disease.  Other considerations which      are less likely include esophagitis or gastritis or gastric      arteriovenous malformations. Additional differential include gastric      neoplasm.  There is no stigmata of liver disease to suggest esophageal      varices as the source of bleeding.  2. Fall associated with syncope, sustaining trauma to the right orbits,      status post stitching of eyebrow laceration in the emergency room.  3. History of transitional cell bladder cancer.  Several years ago, the      patient underwent a right nephrectomy and transurethral resection of      the bladder.  He has subsequently undergone follow-up transurethral      resection of the bladder for transitional cell carcinoma recurrence.  4. Coronary calcification described as advanced by chest CT on May 2006.      The patient has never had any coronary symptoms or undergone cardiac      catheterization.  5. Mild chronic obstructive pulmonary disease with blebs on chest CT of      May 2006.  6. Benign essential tremor.   1. Anemia secondary to acute blood loss.  2. Mild thrombocytopenia.   1. Hyperglycemia.  Patient not known to have glucose intolerance.  2. Azotemia secondary to GI bleeding.   DISCHARGE DIAGNOSES:  1. Upper GI bleed secondary to duodenal ulcer.  Status post 3 separate      endoscopies.  2. Positive Helicobacter pylori.  Plan eradication treatment in      conjunction with proton pump  inhibitor therapy.  3. Acute blood loss anemia.  The patient received a total of 7 units of      packed red blood cells during this admission.  4. Syncope associated with volume collapse/hypotension secondary to GI      bleed.  The patient's blood pressures do tend to run in the low range.  5. Status post repair of eyebrow laceration.  Trauma occurred following      fall associated with syncope.  6. Thrombocytopenia and slight increase of PT.  Question low level DIC.      The patient did not require transfusion with any FFP.  7. Glucose intolerance.  The patient did not require use of sliding scale      insulin and was not initiated on any oral hypoglycemics.  His blood      sugars will need to be followed up in the future, to assess for  possible progression to medication-requiring diabetes mellitus.   CONSULTATIONS:  With Critical Care, Dr. Coralyn Helling, and with  [facility] surgery, Dr. Wenda Low.   PROCEDURES:  1. First endoscopy on December 09, 2005.  There was an actively oozing      ulcer at the duodenal bulb with adherent clot.  This was injected with      epinephrine, following which hemostasis was achieved.  2. Second endoscopy on December 10, 2005 showing an actively bleeding and      pulsating arterial source of bleeding at the duodenal bulb ulcer.  This      was injected with epinephrine and an endo clip was applied directly to      the visible vessel.  3. Third and final, EGD performed on December 12, 2005.  This revealed no      active or old blood in the stomach or duodenum.  The duodenal ulcer had      a clean base and showed no signs of recent bleeding.  4. Central venous catheter placement, placed into the left internal      jugular on December 10, 2005 by Dr. Craige Cotta.   BRIEF HISTORY:  [name] is a pleasant, 77 year old white male who is very  active and was taking only 2 medications prior to this admission.  These  included a daily aspirin as well as  Aleve, which he was taking because he  just felt kind of weak and thought that the Aleve with the help perk him up.  He was not taking Aleve for any pain relief.  The patient came to the  emergency room, having had an episode of syncope.  Over the last couple of  days, stools had been dark and tarry.  On the day of admission, he had frank  hematemesis and passed a more grossly bloody stools.  Afterwards, he had  short loss of consciousness, and he fell sustaining a laceration to the  right eyebrow.  The patient has no deleterious habits such as drinking  alcohol or smoking cigarettes.  He did remotely smoke cigarettes.  The  patient was seen in the emergency room by Dr. Melvia Heaps.  The patient  had not previously had assignment of the GI doctor, as he has never  undergone endoscopy or colonoscopy, never had ulcer disease.  Dr. Arlyce Dice  admitted the patient to the ICU and planned urgent endoscopy to locate the  source of the GI bleeding.  The patient's blood pressure in the emergency  room was 116/61 with a pulse of 96.  His initial hemoglobin was 11.6, and  the platelets 145,000.  Initial PT INR and PTT were within normal limits.   LABS:  Admission level hemoglobin 11.6, hemoglobin low of 8.5.  It was 11.1  at discharge.  The white blood cell count initially 11.3.  It was 7.5 at  discharge.  Hematocrit low of 24.2, 32.4 at discharge.  Platelets initially  145, platelet low of 71, 116 at discharge.  Initial PT 15.1.  PT max of  17.8, 15.3 at discharge.  INR max of 1.4, 1.2 at discharge.  PTT range  between 25 and 35.  Fecal occult blood positive.  Sodium 140, potassium 4.5.  Chloride 106, CO2 28.  Glucose ranging 170 on admission to 143 on the day of  discharge.  Go back into the discharge diagnosis and at in their glucose  intolerance.  The glucose was ranging 170-143 on discharge,  BUN maximum of  48, it was 15 at discharge.  Creatinine maximum 1.6, 1.5 at discharge.  Calcium 7.4.   Albumin 2.2.  Total bilirubin 0.7, alkaline phosphatase 56,  AST 30, ALT 22.  Helicobacter pylori antibody serum testing at greater than  8.  This is well into the positive range.   IMAGING STUDIES:  Initial portable chest x-ray showed changes of COPD and  chronic bronchitis.  Minimal calcified pleural plaque disease.  Question  bilateral nipples shadows.  Portable chest x-ray following placement of  central line showed chronic bronchitic and interstitial changes, and no  pneumothorax.  Incidental spur formation on the thoracic spine noted.   HOSPITAL COURSE:  Upper GI bleed.  The patient was admitted to the ICU  following his upper endoscopy.  This revealed the source of the GI bleeding  to be a duodenal ulcer.  At endoscopy, the ulcer was injected with  epinephrine and the bleeding stopped.  Overnight, the patient's blood  pressure tended to be low with systolics in the 90s and diastolics as low as  the 30s.  However, pulse was not tachycardiac, running generally in the 70s  to 80s range.  By the second day of hospitalization, the patient had had a  dark stool but no further emesis.  He was observed in the ICU and was  relatively stable that day, until the afternoon when his blood pressure  dropped again, he had another near syncopal spell, and his hemoglobin also  dropped.  He underwent repeat endoscopy, at which time the ulcer was not  only injected with epinephrine but an endo-clip applied to a spurring  visible vessel.  For the next few days, the patient had just one melenic-  looking stool on the morning of November 5th.  The patient was weak.  His  hemoglobin was closely monitored and went from 11.6 to 9.6, to a low of 8.5.  He was transfused up to a hemoglobin of around 10.9.  His hemoglobin  continued to fluctuate, and he ended up receiving more blood for a total of  7 units transfused during the course of this hospitalization.  Because of  the fluctuating hemoglobin, Dr. Juanda Chance  elected to perform a third endoscopy,  not for therapeutic reasons but to assess whether or not the ulcer was still  bleeding.  Indeed, this third and final endoscopy revealed there was no  further bleeding.  Ultimately, the patient's hemoglobin and hematocrit  stabilized, and the patient was transferred out of the ICU.   Medical treatment for the ulcer was with IV Protonix drip, for the bulk of  his hospitalization.  Towards the end of his stay, he was changed over to  oral twice daily Protonix.   REVIEW OF SYSTEMS:  The serum Helicobacter testing revealed very positive  range of the antibody, and the plan was to begin treatment with a Prevpac at  discharge.  After he completes this, he can use any one of several proton  pump inhibitors, including over-the-counter Prilosec, depending on which  will be most cost-effective for the patient.  1. The patient and the family, who by the way are very pleasant people,      were advised to discontinue all aspirin and nonsteroidal products.      Then they were advised that Tylenol or non-aspirin type pain relievers      would be fine to use, should he need them.  The family also inquired      about diet, and it was advised  that because of his blood sugars he      should avoid concentrated sweets, but that no other dietary      restrictions were necessary.  2. Anemia:  As described above, the patient ended up receiving a total of      7 units of packed red blood cells during this admission.  3. Low level DIC.  When he came, the patient's platelets were minimally      decreased.  Throughout the course of the hospitalization, they      decreased further, but never to critical levels.  The PT and INR were      normal when he was initially hospitalized, but the PT did rise slightly      during the course of hospitalization.  He did receive a single dose of      10 mg of IV vitamin K on November 5th.  4. Syncope associated with syncope secondary to GI  bleed and associated      vascular collapse.  Once we got the GI bleeding under control, the      patient had no further episodes of syncope.  In addition to the      syncopal episode he had prior to admission, he did have a second near      syncopal spell,  just before he underwent a second EGD.  The patient      received multiple boluses of normal saline, in addition to transfusion      of packed red blood cells in order to maintain adequate blood pressure.      Note that blood pressure in the last 24 hours of hospitalization was      ranging with systolics of 114 to 140 and systolics of 59 to 70.  5. Facial trauma with eyebrow laceration requiring suturing in the      emergency room.  Plan is to make a call to the emergency room to find      out what type of suture material was used.  If this is a      biodegradable/dissolvable-type suture, then these will be left in      place, otherwise will plan to make arrangements for the sutures to be      removed.  6. History of transitional cell carcinoma for which he is status post      right nephrectomy, as well as multiple TURB procedures.  Dr. Logan Bores is      his urologist.   CONDITION AT DISCHARGE:  Stable.   FOLLOWUP APPOINTMENTS:  Follow-up appointments with Dr. Melvia Heaps on  November 20.  The patient advised not to drive for 1 week.   MEDICATIONS AT DISCHARGE:  1. Iron sulfate 325 mg 3 times daily.  If this is difficult for him to      tolerate, he may decrease this to twice daily.  Ne  2. Prevpac for 14 days.  3. Proton pump inhibitor therapy once daily, after the Prevpac has been      completed.  Will prescribe Prevacid, but if his formulary prefers      another PPI, it is okay.  This can be substituted, including use of      daily over-the-counter Prilosec.  4. Tylenol or any non-aspirin pain reliever as needed for pain.  Note:  The patient currently does not have a primary care doctor, his doctor  at [facility], is no longer associated with that  practice.  The patient's  daughter plans to try to get the patient in with a  different [facility] over near [facility].  Therefore, we are sending  a copy of this discharge summary to the patient's home, and he can bring it  with him to his appointment with his new yet to be determined primary care  doctor.      Jennye Moccasin, PA-C      Hedwig Morton. Juanda Chance, MD  Electronically Signed    SG/MEDQ  D:  12/14/2005  T:  12/14/2005  Job:  [id number]   cc:   Barbette Hair. Arlyce Dice, MD,FACG  Mady Haagensen Daisey

## 2010-06-24 NOTE — H&P (Signed)
[name], [name]                  ACCOUNT NO.:  [id number]   MEDICAL RECORD NO.:  [id number]           PATIENT TYPE:   LOCATION:                               FACILITY:  [facility]   PHYSICIAN:  Hollice Espy, M.D.DATE OF BIRTH:  [birth year]   DATE OF ADMISSION:  05/21/2006  DATE OF DISCHARGE:                              HISTORY & PHYSICAL   PRIMARY CARE PHYSICIAN:  Dr. Stacie Acres of [place] at [facility].   CHIEF COMPLAINT:  Shortness of breath and cough.   HISTORY OF PRESENT ILLNESS:  The patient is an 77 year old white male  with past medical history of status post nephrectomy years ago for renal  carcinoma and GERD, who presents to the emergency room after several  weeks of worsening shortness of breath, dyspnea on exertion, and  productive cough with yellowish sputum.  His symptoms continued to  progress and he was feeling worse, he could not take it anymore and came  in to the emergency room for further evaluation.  Chest x-ray showed  evidence of a right middle to lower lobe infiltrate as well as some  signs of chronic lung disease and possible pleural plaques.  Lab work  was ordered on the patient and he was noted to have an elevated white  count of 13.2 with a 91% shift.  The rest of his labs are notable for a  normal BUN of 21 but with a creatinine of 1.8 and initially he was  saturating 89% on room air with a heart rate of 130 and a blood pressure  of 92/62.  He was started on IV fluids and his blood pressure improved  to a systolic greater than 100.  He was put on oxygen and his O2  saturations improved to about 96% on 2 liters.  He was given IV Rocephin  and Zithromax in the emergency room.  Currently he states he is feeling  a little bit better.  He denies any headaches, vision changes,  dysphagia, chest pain, palpitations.  He says his shortness of breath is  better with oxygen and he is able to take more of a deeper inspiration.  He does complain of some continued wheezing  and coughing.  He denies any  abdominal pain.  No hematuria, dysuria, constipation, diarrhea, focal  extremity numbness, weakness, or pain.  His review of systems is  otherwise negative.   PAST MEDICAL HISTORY:  1. Status post a nephrectomy for renal cell carcinoma years ago.  He      has had no long term problems from that.  His baseline creatinine      is unknown.  2. He also has a history of GERD.   MEDICATIONS:  He is on Nexium, p.r.n. Tylenol, p.r.n. Advil, and a  recent course of Zithromax.   ALLERGIES:  He is allergic to MORPHINE, which he says makes him  confused.   SOCIAL HISTORY:  He denies any current tobacco, alcohol, or drug use.  He used to smoke but quit over 35 years ago.   FAMILY HISTORY:  Noncontributory.   PHYSICAL EXAMINATION:  VITAL SIGNS:  On admission, temperature 101.3,  heart rate 130, now down to 107, blood pressure 92/62 initially, now up  to 101/45, respirations 22, O2 saturation 96% on 2 liters, 89% on room  air.  GENERAL:  The patient is alert and oriented x3, no apparent distress.  HEENT:  Normocephalic, atraumatic.  His mucous membranes are slightly  dry.  He has no carotid bruits.  HEART:  Regular rhythm tachycardia.  LUNGS:  He has bibasilar crackles with some increased rhonchi on the  right side.  He has bilateral expiratory wheezing.  ABDOMEN:  Soft, nontender, nondistended.  Positive bowel sounds.  EXTREMITIES:  Show no clubbing or cyanosis.  Trace pitting edema.  He  has got 2+ pulses.   LABORATORY DATA:  White count 13.2, H&H 13 and 37, MCV of 92, platelet  count 150, 91% neutrophils.  Sodium 132, potassium 3.9, chloride 103,  bicarb 22, BUN 21, creatinine 1.8, glucose 150.   Chest x-ray assessed per HPI.   ASSESSMENT/PLAN:  1. Right middle to lower lobe pneumonia.  Will put the patient on      oxygen, antibiotics, and breathing treatments.  He likely has some      signs of chronic lung disease, either brought on from previous       years of smoking or from exposure, especially no new pleural      plaques.  2. Hypotension and tachycardia.  Less likely this is signs of sepsis      and more likely that this is significant dehydration, especially      with him having one kidney.  Will plan on gently hydrating and      continue to monitor.  Will place him on telemetry bed.  3. Status post nephrectomy with acute on chronic renal failure.  Will      gently hydrate.  4. Gastroesophageal reflux disease.  Continue Nexium.  5. Pleural plaque seen on chest x-ray.  The patient has no previous      documented history of lung disease.  He likely needs an outpatient      CT scan to follow up his pleural plaques to ensure no other signs      of cause of concern.  Given his acuity findings, as well as his      dehydration, will not plan on checking a CT with contrast at this      time, but again he can have this done as an outpatient.      Hollice Espy, M.D.  Electronically Signed     SKK/MEDQ  D:  05/21/2006  T:  05/21/2006  Job:  [id number]   cc:   Dr. Stacie Acres

## 2010-06-24 NOTE — Assessment & Plan Note (Signed)
[facility]                           GASTROENTEROLOGY OFFICE NOTE   NAME:[name], [name]                         MRN:          [id number]  DATE:12/26/2005                            DOB:          22-Jul-1923    PROBLEM:  Bleeding duodenal ulcer.   [name] has returned following his hospitalization for acute GI bleeding  secondary to duodenal ulcers.  He required blood transfusions and clipping  of his ulcers.  There were two.  He has syncope associated with hypotension  and bleeding.  He is currently on a Prevpac because of H.pylori antibodies.  He remains on Protonix 40 mg a day.  He is also taking iron.  He has had no  problems since his discharge.   PHYSICAL EXAMINATION:  VITAL SIGNS:  Pulse 72, blood pressure 122/60, weight  171.   IMPRESSION:  Acute duodenal ulcers with bleeding - resolved.   RECOMMENDATIONS:  1. Continue Protonix.  2. Complete one more month of iron.  3. Complete Prevpac course.  4. Follow-up CBC.     Barbette Hair. Arlyce Dice, MD,FACG  Electronically Signed    RDK/MedQ  DD: 12/26/2005  DT: 12/26/2005  Job #: [id number]

## 2010-06-24 NOTE — Op Note (Signed)
[facility]  Patient:    [name], [name] Visit Number: [id number] MRN: [id number]          Service Type: DSU Location: DAY Attending Physician:  Londell Moh Proc. Date: 10/02/00 Adm. Date:  10/02/2000                             Operative Report  PREOPERATIVE DIAGNOSIS:  Recurrent transitional cell carcinoma of the bladder.  POSTOPERATIVE DIAGNOSIS:  Recurrent transitional cell carcinoma of the bladder.  PROCEDURE:  Cystoscopy and TURB.  SURGEON:  Jamison Neighbor, M.D.  ANESTHESIA:  General.  COMPLICATIONS:  None.  DRAINS:  None.  BRIEF HISTORY:  This 77 year old male is status post TURB x 2 for removal of bladder tumors.  The patient has had a recent cystoscopy in the office which showed evidence of a tumor on the left-hand side.  The patient is now to undergo removal of the transitional cell carcinoma.  The patient understand the risks and benefits of the procedure and gave full and informed consent.  DESCRIPTION OF PROCEDURE:  After successful induction of general anesthesia, the patient was placed in the dorsal lithotomy position and prepped with Betadine and draped in the usual sterile fashion.  Cystoscopy was performed. The left ureteral orifice was easily identified but because of angulation from prostatic enlargement, could not be cannulated.  The urine seen to come out was clear.  On the right-hand side, the ureteral orifice was not identified secondary to previous scarring.  Additional imaging studies may prove necessary if the ureters need to be evaluated.  The bladder was carefully inspected.  It was quite difficult to see the tumor that had been seen on the patients flexible cystoscopy, and this was not readily apparent.  The flexible cystoscope was grasped, and it was easier to see the lesion which was on the left-hand side, up high and away from the ureter and close to the prostate.  Once that was visualized, the  resectoscope was inserted and with downward pressure, the tumor was identified.  This was resected.  The pieces were taken for analysis.  The patient had the base of the lesion cauterized. Hemostasis was adequate.  No catheter was required.  The patient tolerated the procedure well and was taken to the recovery room in good condition. Attending Physician:  Londell Moh DD:  10/02/00 TD:  10/02/00 Job: [id number]

## 2010-06-24 NOTE — Assessment & Plan Note (Signed)
[facility]                         GASTROENTEROLOGY OFFICE NOTE   NAME:Mawson, [name]                         MRN:          [id number]  DATE:03/28/2006                            DOB:          07/19/1923    PROBLEM:  Bleeding duodenal ulcer.   [name] has returned for scheduled followup.  He has had no further GI  complaints.  He remains on omeprazole 40 mg a day.  Appetite is  excellent and he actually has gained weight.  He has never had a  colonoscopy.   On exam, pulse 80, blood pressure 130/74, weight 183.   IMPRESSION:  Bleeding duodenal ulcer - resolved.   RECOMMENDATIONS:  1. Reduce omeprazole to 20 mg a day.  2. Screening colonoscopy.     Barbette Hair. Arlyce Dice, MD,FACG  Electronically Signed    RDK/MedQ  DD: 03/28/2006  DT: 03/28/2006  Job #: [id number]

## 2010-06-24 NOTE — Discharge Summary (Signed)
NAMEPIERCE, [name]                  ACCOUNT NO.:  [id number]   MEDICAL RECORD NO.:  [id number]          PATIENT TYPE:  INP   LOCATION:  5530                         FACILITY:  [facility]   PHYSICIAN:  Theone Stanley, MD   DATE OF BIRTH:  14-Mar-1923   DATE OF ADMISSION:  05/21/2006  DATE OF DISCHARGE:  12/14/2005                               DISCHARGE SUMMARY   ADMITTING DIAGNOSES:  1. Cough.  2. Shortness of breath.  3. History of renal cell carcinoma status post nephrectomy.  4. Gastroesophageal reflux disease.   DISCHARGE DIAGNOSES:  1. Right middle lobe pneumonia.  2. Prerenal azotemia, resolved.  3. Gastroesophageal reflux disease.  4. Status post nephrectomy for renal cell carcinoma.  5. Chronic lung disease of unknown etiology.   CONSULTATIONS:  None.   PROCEDURE/DIAGNOSTIC TESTS:  None.   PERTINENT LABS:  Patient's last set of labs showed a white count of 7.7,  hemoglobin 11, hematocrit 32, platelets at 192, sodium 137, potassium  3.8, chloride at 106, CO2 of 20, glucose at 113, BUN at 20, creatinine  at 1.59.  Blood cultures x2 were negative.  Urinalysis on the 13th was  negative for evidence of infection.   HOSPITAL COURSE:  [name] is a very pleasant 77 year old gentleman  with minimal medical problems status post nephrectomy from renal cell  carcinoma presenting to the hospital with shortness of breath and cough.  Evaluation noted right middle lobe infiltrate with a white count of 13.  Patient was admitted at that time for pneumonia.  In addition, it was  noted that his creatinine was 1.8.  I was able to contact both Dr. Logan Bores  and Dr. Aram Beecham office and apparently his baseline creatinine is 1.5.  Patient was hydrated, given IV Avelox, nebulizer treatments, O2 as  needed and slowly patient improved.  His creatinine came down near  baseline to 1.59.  He will need a repeat BNP on seeing Dr. Stacie Acres.  He  continued to have cough and some fever, however he was able to  tolerate  POs.  It was felt that it is safe enough to discharge him, especially  with a response of white his white count with the Avelox.  Patient was  ambulating the halls, vital signs were stable.  He was discharged in  stable condition to home.   DISCHARGE MEDICATIONS:  1. Avelox 400 mg one p.o. q.day for eight days.  2. Tussionex 5 mL p.o. q.h.s. p.r.n.  3. Mucinex DM one p.o. t.i.d. for seven days.  4. Combivent one to two puffs q.i.d. p.r.n.  5. Tylenol 650 p.o. q.6 h p.r.n.  6. Patient is to continue his home medication, omeprazole 40 mg one      p.o. q.day.   Patient is to follow up with Dr. Stacie Acres in five to seven days with a BNP  at that time, instructed to keep himself hydrated and to encourage p.o.  intake.  Patient had an infection prior to his admission.      Theone Stanley, MD  Electronically Signed     AEJ/MEDQ  D:  05/25/2006  T:  05/25/2006  Job:  [id number]   cc:   Royetta Crochet, MD

## 2010-06-24 NOTE — Op Note (Signed)
[name], [name]                  ACCOUNT NO.:  [id number]   MEDICAL RECORD NO.:  [id number]          PATIENT TYPE:  INP   LOCATION:  2104                         FACILITY:  [facility]   PHYSICIAN:  Hedwig Morton. Juanda Chance, MD     DATE OF BIRTH:  September 05, 1923   DATE OF PROCEDURE:  DATE OF DISCHARGE:                                 OPERATIVE REPORT   PROCEDURE PERFORMED:  Upper endoscopy.   INDICATIONS FOR PROCEDURE:  This 77 year old white male was admitted three  days ago with an acute arterial bleed from a duodenal ulcer located by Dr.  Arlyce Dice on upper endoscopy.  The patient required 7 units of packed cells  after initial bleeding, and recurrence of the bleeding necessitating a  second endoscopy 24 hours ago.  He since then has received a total of 7  units of blood, and his blood pressures have continued to be somewhat low,  around 90 systolic, but his BUN has come down to 26 and his hemoglobin has  responded to transfusions.  It was not clear as to whether we are dealing  with a continued bleed or equilibration.  The patient has received some  fluid boluses for hypotension, but his pulse rate has been steady at around  70.  He is undergoing upper endoscopy to assess the activity of the  bleeding.   EQUIPMENT UTILIZED:  Endoscope:  Olympus Single channel video endoscope   ANESTHESIA:  Sedation with Versed 2.5 mg IV; Fentanyl 25 mcg IV.   FINDINGS:  The Olympus Single channel videoendoscope scope was passed under  direct vision  through the posterior pharynx and into the esophagus.  The  patient was monitored by pulse oximetry.  Oxygen saturations were normal.  He was cooperative.  The proximal and distal esophageal mucosa was  unremarkable.  There was a normal squamocolumnar junction.  There was no  blood in the esophagus.  No esophageal varices.   Stomach:  The stomach was insufflated with air.  The stomach had a small  amount of bilious material collected along the greater curvature of  the  stomach.  The mucosa was somewhat erythematous, but there were no erosions  or any blood in the stomach.  The pyloric outlet was normal.   Duodenum:  The duodenal bulb contained a previously mentioned duodenal  ulcer.  We found the ulcer was completely blood-free.  No visible vessel.  A  retained clip was still present in the center of the ulcer which had a clean  base and no stigmata of recent bleeding.  No blood was noted in the lumen of  the duodenum.  At that point the scope was retracted, the stomach  decompressed.  The patient tolerated the procedure well.   IMPRESSION:  1. No evidence of acute gastrointestinal blood loss.  2. No evidence of recent gastrointestinal blood loss.  3. Healing duodenal ulcer, Helicobacter pylori positive.   PLAN:  We will continue to observe the patient for further bleeding.  At  this time he is stable; and will continue on a Protonix drip; advance his  diet  to full liquids; and we will most likely start his Helicobacter pylori  regimen in the next 24-48 hours when he is capable of eating solid foods.      Hedwig Morton. Juanda Chance, MD  Electronically Signed     DMB/MEDQ  D:  12/12/2005  T:  12/13/2005  Job:  [id number]   cc:   Barbette Hair. Arlyce Dice, MD,FACG

## 2010-06-24 NOTE — Op Note (Signed)
NAME:  [name], [name]                  ACCOUNT NO.:  [id number]   MEDICAL RECORD NO.:  [id number]          PATIENT TYPE:  AMB   LOCATION:  [facility]                         FACILITY:  [facility]   PHYSICIAN:  Jamison Neighbor, M.D.  DATE OF BIRTH:  05/16/1923   DATE OF PROCEDURE:  02/21/2005  DATE OF DISCHARGE:                                 OPERATIVE REPORT   SERVICE:  Urology.   PREOPERATIVE DIAGNOSES:  Transitional cell carcinoma.   POSTOPERATIVE DIAGNOSES:  Multiple transitional cell carcinoma.   PROCEDURE:  TURBT x5 with fulguration of irregular tissue.   SURGEON:  Jamison Neighbor, M.D.   ANESTHESIA:  General.   COMPLICATIONS:  None.   DRAINS:  None.   BRIEF HISTORY:  This 77 year old male is status post right  nephroureterectomy and has also had TURBT performed. The patient has had no  signs or symptoms of recurrent tumor but on a recent cystoscopy was found to  have recurrent lesions. He is now to undergo resection of multiple tumors  plus fulguration of any irregular areas. He understands the risks and  benefits of the procedure and gave full informed consent.   DESCRIPTION OF PROCEDURE:  After successful induction of general anesthesia,  the patient was placed in the dorsal lithotomy position, prepped with  Betadine and draped in the usual sterile fashion. Cystoscopy was performed,  the urethra was visualized in its entirety and was found to be normal.  Beyond the verumontanum, prostatic enlargement was seen but there was really  not much in the way of real bladder outlet obstruction. There was no right  ureter, the left ureter appeared normal. Attempts at retrogrades, however,  unsuccessful as the prostatic enlargement made it impossible to cannulate  that ureter. The patient can certainly have other imaging studies and follow  the upper tracts. The patient had several superficial lesions just to the  right of the midline and then a few others towards the left trigone and one  larger lesion on the left hand side laterally. These were all resected and  specimens were sent. Additionally, areas that were moderatly irregular  between and around these large lesions were all carefully fulgurated until  all suspicious tissue had been dealt with. All chips were irrigated from the  bladder. Inspection  showed that good depth of resection was obtained for the suspicious areas  and the other areas had been adequately fulgurated. The patient was left  with an 28 French catheter and will be sent home with Lorcet plus as well as  Septra DS and return to see me in followup for catheter removal.           ______________________________  Jamison Neighbor, M.D.  Electronically Signed     RJE/MEDQ  D:  02/21/2005  T:  02/21/2005  Job:  [id number]

## 2010-11-11 LAB — URINALYSIS, ROUTINE W REFLEX MICROSCOPIC
Bilirubin Urine: NEGATIVE
Glucose, UA: NEGATIVE mg/dL
Hgb urine dipstick: NEGATIVE
Specific Gravity, Urine: 1.01 (ref 1.005–1.030)
pH: 6.5 (ref 5.0–8.0)

## 2010-11-11 LAB — COMPREHENSIVE METABOLIC PANEL
ALT: 24 U/L (ref 0–53)
AST: 27 U/L (ref 0–37)
Albumin: 3.6 g/dL (ref 3.5–5.2)
Alkaline Phosphatase: 67 U/L (ref 39–117)
BUN: 22 mg/dL (ref 6–23)
CO2: 23 mEq/L (ref 19–32)
Calcium: 8.3 mg/dL — ABNORMAL LOW (ref 8.4–10.5)
Chloride: 101 mEq/L (ref 96–112)
Creatinine, Ser: 1.45 mg/dL (ref 0.4–1.5)
GFR calc Af Amer: 56 mL/min — ABNORMAL LOW (ref >60)
GFR calc non Af Amer: 47 mL/min — ABNORMAL LOW (ref >60)
Glucose, Bld: 123 mg/dL — ABNORMAL HIGH (ref 70–99)
Potassium: 3.9 mEq/L (ref 3.5–5.1)
Sodium: 135 mEq/L (ref 135–145)
Total Bilirubin: 0.5 mg/dL (ref 0.3–1.2)
Total Protein: 5.8 g/dL — ABNORMAL LOW (ref 6.0–8.3)
Total Protein: 7.1 g/dL (ref 6.0–8.3)

## 2010-11-11 LAB — CK TOTAL AND CKMB (NOT AT ARMC)
CK, MB: 2.4 ng/mL (ref 0.3–4.0)
Relative Index: INVALID (ref 0.0–2.5)
Total CK: 32 U/L (ref 7–232)

## 2010-11-11 LAB — PROTIME-INR: INR: 1 (ref 0.00–1.49)

## 2010-11-11 LAB — HOMOCYSTEINE: Homocysteine: 12.5 umol/L (ref 4.0–15.4)

## 2010-11-11 LAB — CBC
HCT: 38.2 % — ABNORMAL LOW (ref 39.0–52.0)
Hemoglobin: 13.1 g/dL (ref 13.0–17.0)
MCV: 96.8 fL (ref 78.0–100.0)
Platelets: 142 10*3/uL — ABNORMAL LOW (ref 150–400)
RBC: 3.95 MIL/uL — ABNORMAL LOW (ref 4.22–5.81)
RDW: 13 % (ref 11.5–15.5)
WBC: 7.2 10*3/uL (ref 4.0–10.5)
WBC: 8.1 10*3/uL (ref 4.0–10.5)

## 2010-11-11 LAB — CARDIAC PANEL(CRET KIN+CKTOT+MB+TROPI)
Relative Index: INVALID (ref 0.0–2.5)
Relative Index: INVALID (ref 0.0–2.5)
Total CK: 22 U/L (ref 7–232)
Total CK: 26 U/L (ref 7–232)
Troponin I: 0.01 ng/mL (ref 0.00–0.06)

## 2010-11-11 LAB — URINE CULTURE: Colony Count: NO GROWTH

## 2010-11-11 LAB — LIPID PANEL
HDL: 26 mg/dL — ABNORMAL LOW (ref >39)
LDL Cholesterol: UNDETERMINED mg/dL (ref 0–99)
Total CHOL/HDL Ratio: 6.8 RATIO
Triglycerides: 555 mg/dL — ABNORMAL HIGH (ref <150)
VLDL: UNDETERMINED mg/dL (ref 0–40)

## 2010-11-11 LAB — DIFFERENTIAL
Basophils Absolute: 0 10*3/uL (ref 0.0–0.1)
Eosinophils Relative: 3 % (ref 0–5)
Lymphocytes Relative: 16 % (ref 12–46)
Monocytes Absolute: 0.7 10*3/uL (ref 0.1–1.0)
Monocytes Relative: 8 % (ref 3–12)
Neutro Abs: 5.9 10*3/uL (ref 1.7–7.7)

## 2010-11-11 LAB — FOLATE RBC: RBC Folate: 1050 ng/mL — ABNORMAL HIGH (ref 180–600)

## 2010-11-11 LAB — CULTURE, BLOOD (ROUTINE X 2): Culture: NO GROWTH

## 2010-11-11 LAB — VITAMIN B12: Vitamin B-12: 403 pg/mL (ref 211–911)

## 2010-11-11 LAB — TROPONIN I: Troponin I: 0.01 ng/mL (ref 0.00–0.06)

## 2010-11-11 LAB — APTT: aPTT: 29 seconds (ref 24–37)

## 2011-02-20 DIAGNOSIS — H353190 Nonexudative age-related macular degeneration, unspecified eye, stage unspecified: Secondary | ICD-10-CM | POA: Diagnosis not present

## 2011-02-20 DIAGNOSIS — H353290 Exudative age-related macular degeneration, unspecified eye, stage unspecified: Secondary | ICD-10-CM | POA: Diagnosis not present

## 2011-02-20 DIAGNOSIS — H43819 Vitreous degeneration, unspecified eye: Secondary | ICD-10-CM | POA: Diagnosis not present

## 2011-02-20 DIAGNOSIS — H35059 Retinal neovascularization, unspecified, unspecified eye: Secondary | ICD-10-CM | POA: Diagnosis not present

## 2011-03-10 DIAGNOSIS — H353290 Exudative age-related macular degeneration, unspecified eye, stage unspecified: Secondary | ICD-10-CM | POA: Diagnosis not present

## 2011-04-17 DIAGNOSIS — H35059 Retinal neovascularization, unspecified, unspecified eye: Secondary | ICD-10-CM | POA: Diagnosis not present

## 2011-04-17 DIAGNOSIS — H43819 Vitreous degeneration, unspecified eye: Secondary | ICD-10-CM | POA: Diagnosis not present

## 2011-04-17 DIAGNOSIS — H353190 Nonexudative age-related macular degeneration, unspecified eye, stage unspecified: Secondary | ICD-10-CM | POA: Diagnosis not present

## 2011-04-17 DIAGNOSIS — H353290 Exudative age-related macular degeneration, unspecified eye, stage unspecified: Secondary | ICD-10-CM | POA: Diagnosis not present

## 2011-05-19 DIAGNOSIS — H353190 Nonexudative age-related macular degeneration, unspecified eye, stage unspecified: Secondary | ICD-10-CM | POA: Diagnosis not present

## 2011-05-19 DIAGNOSIS — H43819 Vitreous degeneration, unspecified eye: Secondary | ICD-10-CM | POA: Diagnosis not present

## 2011-05-19 DIAGNOSIS — H353290 Exudative age-related macular degeneration, unspecified eye, stage unspecified: Secondary | ICD-10-CM | POA: Diagnosis not present

## 2011-05-19 DIAGNOSIS — H35059 Retinal neovascularization, unspecified, unspecified eye: Secondary | ICD-10-CM | POA: Diagnosis not present

## 2011-06-21 DIAGNOSIS — H35059 Retinal neovascularization, unspecified, unspecified eye: Secondary | ICD-10-CM | POA: Diagnosis not present

## 2011-06-21 DIAGNOSIS — H353290 Exudative age-related macular degeneration, unspecified eye, stage unspecified: Secondary | ICD-10-CM | POA: Diagnosis not present

## 2011-06-21 DIAGNOSIS — H43819 Vitreous degeneration, unspecified eye: Secondary | ICD-10-CM | POA: Diagnosis not present

## 2011-06-21 DIAGNOSIS — H353190 Nonexudative age-related macular degeneration, unspecified eye, stage unspecified: Secondary | ICD-10-CM | POA: Diagnosis not present

## 2011-07-21 DIAGNOSIS — H353290 Exudative age-related macular degeneration, unspecified eye, stage unspecified: Secondary | ICD-10-CM | POA: Diagnosis not present

## 2011-07-21 DIAGNOSIS — H43819 Vitreous degeneration, unspecified eye: Secondary | ICD-10-CM | POA: Diagnosis not present

## 2011-07-21 DIAGNOSIS — H35059 Retinal neovascularization, unspecified, unspecified eye: Secondary | ICD-10-CM | POA: Diagnosis not present

## 2011-07-21 DIAGNOSIS — H353190 Nonexudative age-related macular degeneration, unspecified eye, stage unspecified: Secondary | ICD-10-CM | POA: Diagnosis not present

## 2011-08-24 DIAGNOSIS — I10 Essential (primary) hypertension: Secondary | ICD-10-CM | POA: Diagnosis not present

## 2011-08-24 DIAGNOSIS — K219 Gastro-esophageal reflux disease without esophagitis: Secondary | ICD-10-CM | POA: Diagnosis not present

## 2011-08-24 DIAGNOSIS — E785 Hyperlipidemia, unspecified: Secondary | ICD-10-CM | POA: Diagnosis not present

## 2011-08-24 DIAGNOSIS — I679 Cerebrovascular disease, unspecified: Secondary | ICD-10-CM | POA: Diagnosis not present

## 2011-08-24 DIAGNOSIS — F329 Major depressive disorder, single episode, unspecified: Secondary | ICD-10-CM | POA: Diagnosis not present

## 2011-08-24 DIAGNOSIS — L570 Actinic keratosis: Secondary | ICD-10-CM | POA: Diagnosis not present

## 2011-08-24 DIAGNOSIS — R259 Unspecified abnormal involuntary movements: Secondary | ICD-10-CM | POA: Diagnosis not present

## 2011-12-01 DIAGNOSIS — H353290 Exudative age-related macular degeneration, unspecified eye, stage unspecified: Secondary | ICD-10-CM | POA: Diagnosis not present

## 2012-02-02 DIAGNOSIS — H353290 Exudative age-related macular degeneration, unspecified eye, stage unspecified: Secondary | ICD-10-CM | POA: Diagnosis not present

## 2012-02-02 DIAGNOSIS — H353190 Nonexudative age-related macular degeneration, unspecified eye, stage unspecified: Secondary | ICD-10-CM | POA: Diagnosis not present

## 2012-02-02 DIAGNOSIS — H35059 Retinal neovascularization, unspecified, unspecified eye: Secondary | ICD-10-CM | POA: Diagnosis not present

## 2012-02-02 DIAGNOSIS — H43819 Vitreous degeneration, unspecified eye: Secondary | ICD-10-CM | POA: Diagnosis not present

## 2012-02-08 DIAGNOSIS — I10 Essential (primary) hypertension: Secondary | ICD-10-CM | POA: Diagnosis not present

## 2012-02-08 DIAGNOSIS — J329 Chronic sinusitis, unspecified: Secondary | ICD-10-CM | POA: Diagnosis not present

## 2012-02-08 DIAGNOSIS — G479 Sleep disorder, unspecified: Secondary | ICD-10-CM | POA: Diagnosis not present

## 2012-04-05 DIAGNOSIS — H35059 Retinal neovascularization, unspecified, unspecified eye: Secondary | ICD-10-CM | POA: Diagnosis not present

## 2012-04-30 DIAGNOSIS — E785 Hyperlipidemia, unspecified: Secondary | ICD-10-CM | POA: Diagnosis not present

## 2012-04-30 DIAGNOSIS — F329 Major depressive disorder, single episode, unspecified: Secondary | ICD-10-CM | POA: Diagnosis not present

## 2012-04-30 DIAGNOSIS — I10 Essential (primary) hypertension: Secondary | ICD-10-CM | POA: Diagnosis not present

## 2012-04-30 DIAGNOSIS — G479 Sleep disorder, unspecified: Secondary | ICD-10-CM | POA: Diagnosis not present

## 2012-04-30 DIAGNOSIS — R259 Unspecified abnormal involuntary movements: Secondary | ICD-10-CM | POA: Diagnosis not present

## 2012-04-30 DIAGNOSIS — H6120 Impacted cerumen, unspecified ear: Secondary | ICD-10-CM | POA: Diagnosis not present

## 2012-07-08 DIAGNOSIS — H353190 Nonexudative age-related macular degeneration, unspecified eye, stage unspecified: Secondary | ICD-10-CM | POA: Diagnosis not present

## 2012-07-08 DIAGNOSIS — H43819 Vitreous degeneration, unspecified eye: Secondary | ICD-10-CM | POA: Diagnosis not present

## 2012-07-08 DIAGNOSIS — H35059 Retinal neovascularization, unspecified, unspecified eye: Secondary | ICD-10-CM | POA: Diagnosis not present

## 2012-07-08 DIAGNOSIS — H353290 Exudative age-related macular degeneration, unspecified eye, stage unspecified: Secondary | ICD-10-CM | POA: Diagnosis not present

## 2012-07-19 DIAGNOSIS — I10 Essential (primary) hypertension: Secondary | ICD-10-CM | POA: Diagnosis not present

## 2012-08-25 IMAGING — CR DG CHEST 2V
2 series · 2 of 2 positions shown · non-contrast
Comparison: Chest 11/11/2008 and 01/26/2008.  CT chest 01/26/2008.

CLINICAL DATA: Shortness of breath.

CHEST - 2 VIEW

[w chest pa]
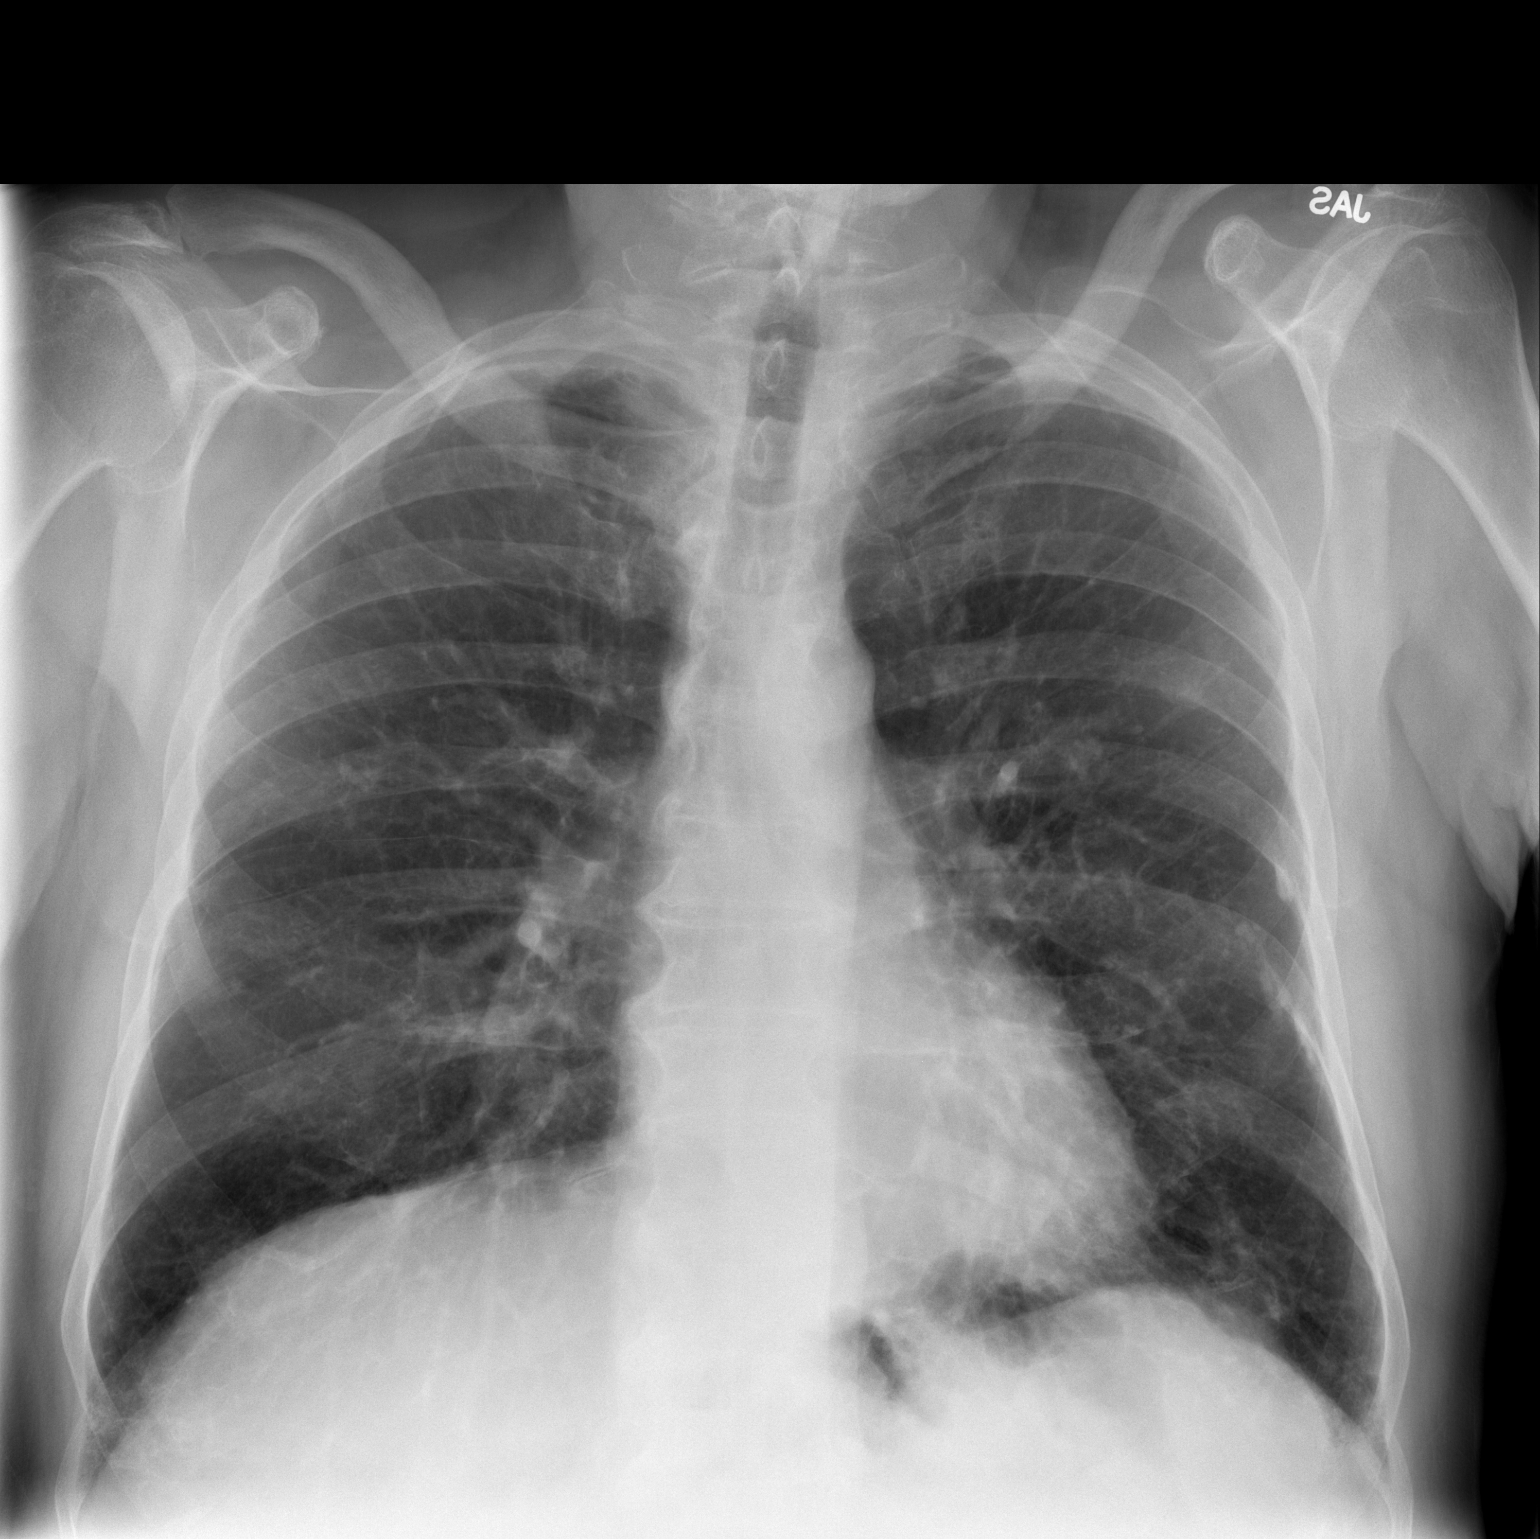

[w chest lat]
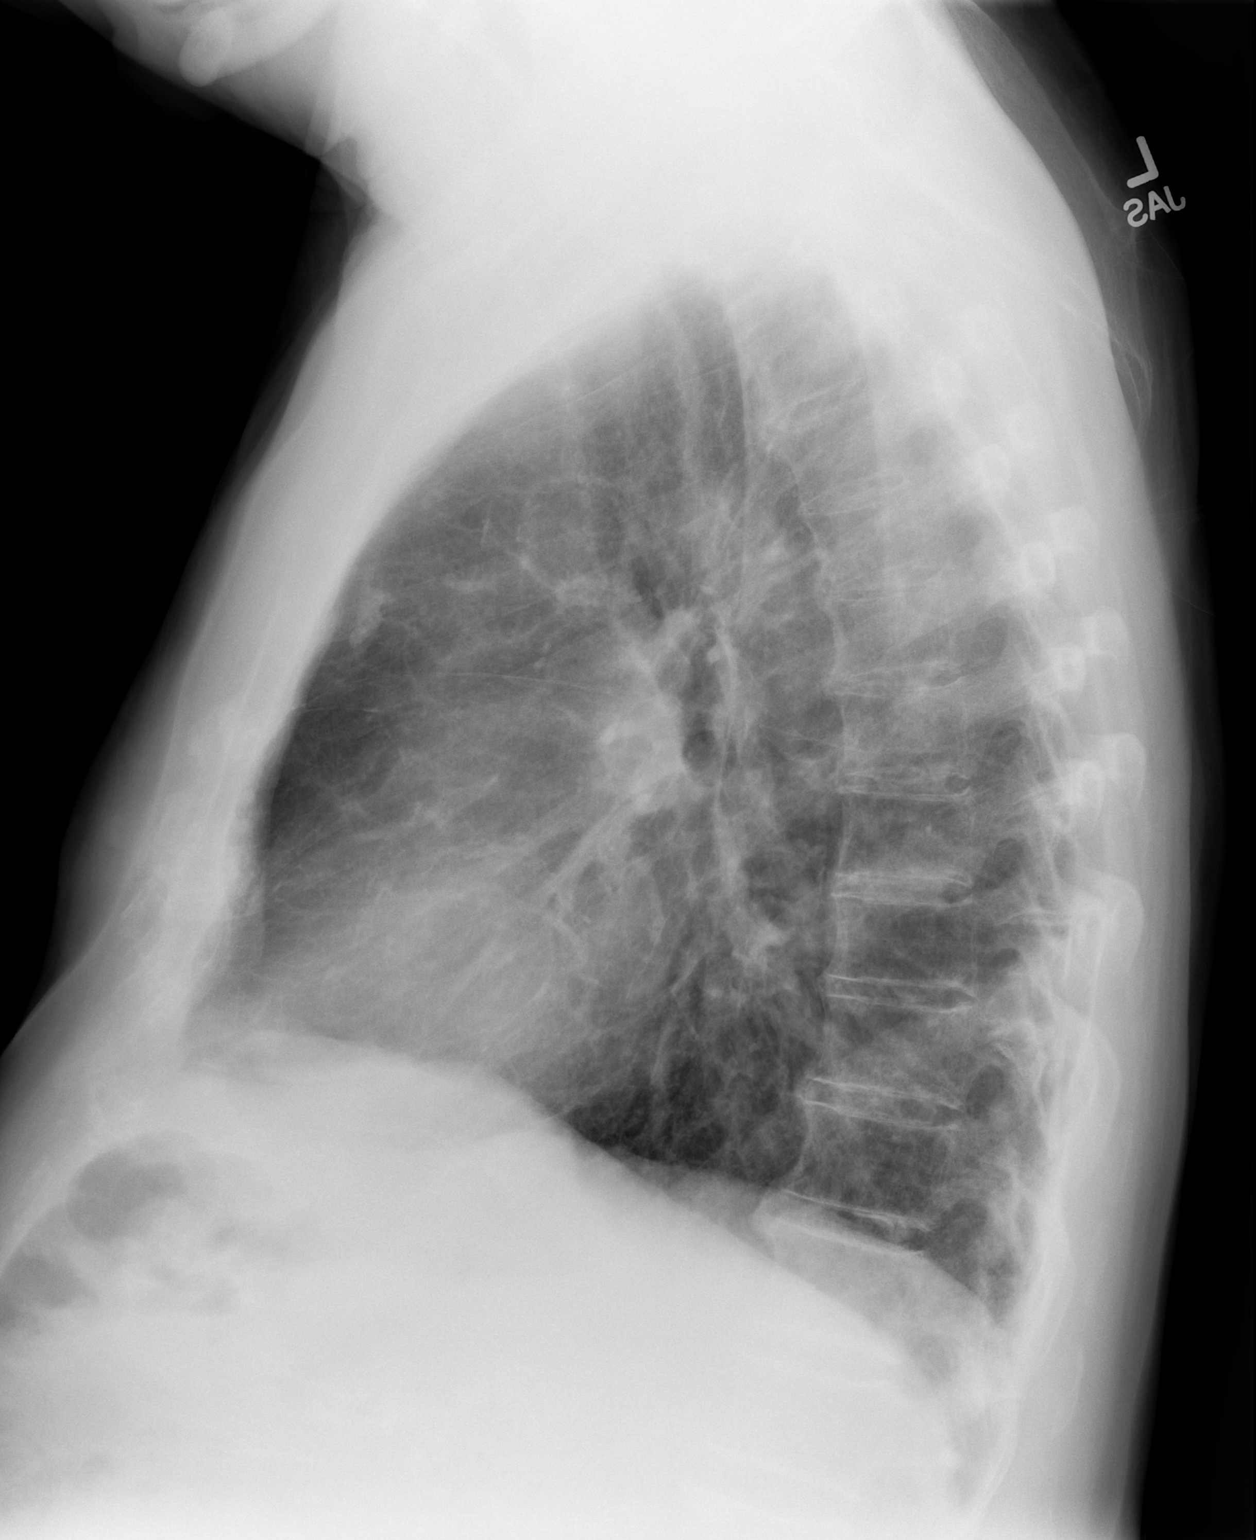

[2 of 2 positions shown; findings below may reference images not displayed]

FINDINGS: Calcified pleural plaques on the left are noted.  Lungs
appear clear.  Heart size is normal.  No pneumothorax or pleural
effusion.
IMPRESSION: No acute disease.

## 2012-09-03 DIAGNOSIS — R5381 Other malaise: Secondary | ICD-10-CM | POA: Diagnosis not present

## 2012-09-03 DIAGNOSIS — F329 Major depressive disorder, single episode, unspecified: Secondary | ICD-10-CM | POA: Diagnosis not present

## 2012-09-03 DIAGNOSIS — R7301 Impaired fasting glucose: Secondary | ICD-10-CM | POA: Diagnosis not present

## 2012-09-03 DIAGNOSIS — R5383 Other fatigue: Secondary | ICD-10-CM | POA: Diagnosis not present

## 2012-09-03 DIAGNOSIS — F3289 Other specified depressive episodes: Secondary | ICD-10-CM | POA: Diagnosis not present

## 2012-09-03 DIAGNOSIS — R259 Unspecified abnormal involuntary movements: Secondary | ICD-10-CM | POA: Diagnosis not present

## 2012-09-03 DIAGNOSIS — G479 Sleep disorder, unspecified: Secondary | ICD-10-CM | POA: Diagnosis not present

## 2012-12-03 DIAGNOSIS — E785 Hyperlipidemia, unspecified: Secondary | ICD-10-CM | POA: Diagnosis not present

## 2012-12-03 DIAGNOSIS — I10 Essential (primary) hypertension: Secondary | ICD-10-CM | POA: Diagnosis not present

## 2012-12-03 DIAGNOSIS — F329 Major depressive disorder, single episode, unspecified: Secondary | ICD-10-CM | POA: Diagnosis not present

## 2012-12-03 DIAGNOSIS — R259 Unspecified abnormal involuntary movements: Secondary | ICD-10-CM | POA: Diagnosis not present

## 2012-12-03 DIAGNOSIS — G479 Sleep disorder, unspecified: Secondary | ICD-10-CM | POA: Diagnosis not present

## 2012-12-03 DIAGNOSIS — R5381 Other malaise: Secondary | ICD-10-CM | POA: Diagnosis not present

## 2012-12-13 DIAGNOSIS — H353190 Nonexudative age-related macular degeneration, unspecified eye, stage unspecified: Secondary | ICD-10-CM | POA: Diagnosis not present

## 2012-12-13 DIAGNOSIS — H35059 Retinal neovascularization, unspecified, unspecified eye: Secondary | ICD-10-CM | POA: Diagnosis not present

## 2012-12-13 DIAGNOSIS — H353290 Exudative age-related macular degeneration, unspecified eye, stage unspecified: Secondary | ICD-10-CM | POA: Diagnosis not present

## 2013-04-22 ENCOUNTER — Other Ambulatory Visit: Payer: Self-pay | Admitting: Gastroenterology

## 2013-04-22 DIAGNOSIS — R1310 Dysphagia, unspecified: Secondary | ICD-10-CM | POA: Diagnosis not present

## 2013-04-25 ENCOUNTER — Ambulatory Visit
Admission: RE | Admit: 2013-04-25 | Discharge: 2013-04-25 | Disposition: A | Discharge status: Home / Self Care | Payer: Medicare Other | Source: Ambulatory Visit | Attending: Gastroenterology | Admitting: Gastroenterology

## 2013-04-25 DIAGNOSIS — R1310 Dysphagia, unspecified: Secondary | ICD-10-CM | POA: Diagnosis not present

## 2013-05-01 DIAGNOSIS — I10 Essential (primary) hypertension: Secondary | ICD-10-CM | POA: Diagnosis not present

## 2013-05-01 DIAGNOSIS — H6120 Impacted cerumen, unspecified ear: Secondary | ICD-10-CM | POA: Diagnosis not present

## 2013-05-01 DIAGNOSIS — F3289 Other specified depressive episodes: Secondary | ICD-10-CM | POA: Diagnosis not present

## 2013-05-01 DIAGNOSIS — N183 Chronic kidney disease, stage 3 unspecified: Secondary | ICD-10-CM | POA: Diagnosis not present

## 2013-05-01 DIAGNOSIS — R259 Unspecified abnormal involuntary movements: Secondary | ICD-10-CM | POA: Diagnosis not present

## 2013-05-01 DIAGNOSIS — E785 Hyperlipidemia, unspecified: Secondary | ICD-10-CM | POA: Diagnosis not present

## 2013-05-01 DIAGNOSIS — F329 Major depressive disorder, single episode, unspecified: Secondary | ICD-10-CM | POA: Diagnosis not present

## 2013-05-01 DIAGNOSIS — R7301 Impaired fasting glucose: Secondary | ICD-10-CM | POA: Diagnosis not present

## 2013-05-01 DIAGNOSIS — R1310 Dysphagia, unspecified: Secondary | ICD-10-CM | POA: Diagnosis not present

## 2013-05-15 DIAGNOSIS — N183 Chronic kidney disease, stage 3 unspecified: Secondary | ICD-10-CM | POA: Diagnosis not present

## 2013-06-05 DIAGNOSIS — H35059 Retinal neovascularization, unspecified, unspecified eye: Secondary | ICD-10-CM | POA: Diagnosis not present

## 2013-06-05 DIAGNOSIS — H353290 Exudative age-related macular degeneration, unspecified eye, stage unspecified: Secondary | ICD-10-CM | POA: Diagnosis not present

## 2013-06-05 DIAGNOSIS — H353190 Nonexudative age-related macular degeneration, unspecified eye, stage unspecified: Secondary | ICD-10-CM | POA: Diagnosis not present

## 2013-06-05 DIAGNOSIS — H43819 Vitreous degeneration, unspecified eye: Secondary | ICD-10-CM | POA: Diagnosis not present

## 2013-07-04 DIAGNOSIS — H35059 Retinal neovascularization, unspecified, unspecified eye: Secondary | ICD-10-CM | POA: Diagnosis not present

## 2013-07-04 DIAGNOSIS — H43819 Vitreous degeneration, unspecified eye: Secondary | ICD-10-CM | POA: Diagnosis not present

## 2013-07-04 DIAGNOSIS — H353290 Exudative age-related macular degeneration, unspecified eye, stage unspecified: Secondary | ICD-10-CM | POA: Diagnosis not present

## 2013-07-30 DIAGNOSIS — N183 Chronic kidney disease, stage 3 unspecified: Secondary | ICD-10-CM | POA: Diagnosis not present

## 2013-07-30 DIAGNOSIS — E785 Hyperlipidemia, unspecified: Secondary | ICD-10-CM | POA: Diagnosis not present

## 2013-07-30 DIAGNOSIS — G479 Sleep disorder, unspecified: Secondary | ICD-10-CM | POA: Diagnosis not present

## 2013-07-30 DIAGNOSIS — R5381 Other malaise: Secondary | ICD-10-CM | POA: Diagnosis not present

## 2013-07-30 DIAGNOSIS — R29898 Other symptoms and signs involving the musculoskeletal system: Secondary | ICD-10-CM | POA: Diagnosis not present

## 2013-07-30 DIAGNOSIS — R5383 Other fatigue: Secondary | ICD-10-CM | POA: Diagnosis not present

## 2013-07-30 DIAGNOSIS — I10 Essential (primary) hypertension: Secondary | ICD-10-CM | POA: Diagnosis not present

## 2013-08-01 DIAGNOSIS — H35059 Retinal neovascularization, unspecified, unspecified eye: Secondary | ICD-10-CM | POA: Diagnosis not present

## 2013-08-01 DIAGNOSIS — H353290 Exudative age-related macular degeneration, unspecified eye, stage unspecified: Secondary | ICD-10-CM | POA: Diagnosis not present

## 2013-08-04 DIAGNOSIS — H35059 Retinal neovascularization, unspecified, unspecified eye: Secondary | ICD-10-CM | POA: Diagnosis not present

## 2013-08-04 DIAGNOSIS — H353290 Exudative age-related macular degeneration, unspecified eye, stage unspecified: Secondary | ICD-10-CM | POA: Diagnosis not present

## 2013-08-19 DIAGNOSIS — H35059 Retinal neovascularization, unspecified, unspecified eye: Secondary | ICD-10-CM | POA: Diagnosis not present

## 2013-08-19 DIAGNOSIS — H353290 Exudative age-related macular degeneration, unspecified eye, stage unspecified: Secondary | ICD-10-CM | POA: Diagnosis not present

## 2013-09-21 ENCOUNTER — Emergency Department (HOSPITAL_COMMUNITY)
Admission: EM | Admit: 2013-09-21 | Discharge: 2013-09-21 | Disposition: A | Discharge status: Home / Self Care | Payer: Medicare Other | Attending: Emergency Medicine | Admitting: Emergency Medicine

## 2013-09-21 ENCOUNTER — Emergency Department (HOSPITAL_COMMUNITY): Payer: Medicare Other

## 2013-09-21 ENCOUNTER — Encounter (HOSPITAL_COMMUNITY): Payer: Self-pay | Admitting: Emergency Medicine

## 2013-09-21 DIAGNOSIS — M549 Dorsalgia, unspecified: Secondary | ICD-10-CM | POA: Insufficient documentation

## 2013-09-21 DIAGNOSIS — M5489 Other dorsalgia: Secondary | ICD-10-CM

## 2013-09-21 DIAGNOSIS — M545 Low back pain, unspecified: Secondary | ICD-10-CM | POA: Diagnosis not present

## 2013-09-21 DIAGNOSIS — M25559 Pain in unspecified hip: Secondary | ICD-10-CM | POA: Diagnosis not present

## 2013-09-21 DIAGNOSIS — Z8719 Personal history of other diseases of the digestive system: Secondary | ICD-10-CM | POA: Diagnosis not present

## 2013-09-21 DIAGNOSIS — E7800 Pure hypercholesterolemia, unspecified: Secondary | ICD-10-CM | POA: Diagnosis not present

## 2013-09-21 DIAGNOSIS — Z79899 Other long term (current) drug therapy: Secondary | ICD-10-CM | POA: Insufficient documentation

## 2013-09-21 DIAGNOSIS — Z8551 Personal history of malignant neoplasm of bladder: Secondary | ICD-10-CM | POA: Insufficient documentation

## 2013-09-21 DIAGNOSIS — Z8673 Personal history of transient ischemic attack (TIA), and cerebral infarction without residual deficits: Secondary | ICD-10-CM | POA: Diagnosis not present

## 2013-09-21 DIAGNOSIS — Z85528 Personal history of other malignant neoplasm of kidney: Secondary | ICD-10-CM | POA: Insufficient documentation

## 2013-09-21 DIAGNOSIS — I10 Essential (primary) hypertension: Secondary | ICD-10-CM | POA: Insufficient documentation

## 2013-09-21 DIAGNOSIS — M5137 Other intervertebral disc degeneration, lumbosacral region: Secondary | ICD-10-CM | POA: Diagnosis not present

## 2013-09-21 DIAGNOSIS — M47817 Spondylosis without myelopathy or radiculopathy, lumbosacral region: Secondary | ICD-10-CM | POA: Diagnosis not present

## 2013-09-21 HISTORY — DX: Malignant neoplasm of bladder, unspecified: C67.9

## 2013-09-21 HISTORY — DX: Cerebral infarction, unspecified: I63.9

## 2013-09-21 HISTORY — DX: Essential (primary) hypertension: I10

## 2013-09-21 HISTORY — DX: Gastro-esophageal reflux disease without esophagitis: K21.9

## 2013-09-21 HISTORY — DX: Pure hypercholesterolemia, unspecified: E78.00

## 2013-09-21 HISTORY — DX: Malignant neoplasm of unspecified kidney, except renal pelvis: C64.9

## 2013-09-21 HISTORY — DX: Malignant (primary) neoplasm, unspecified: C80.1

## 2013-09-21 LAB — URINALYSIS, ROUTINE W REFLEX MICROSCOPIC
GLUCOSE, UA: NEGATIVE mg/dL
Hgb urine dipstick: NEGATIVE
KETONES UR: 15 mg/dL — AB
LEUKOCYTES UA: NEGATIVE
NITRITE: NEGATIVE
PH: 6 (ref 5.0–8.0)
Protein, ur: NEGATIVE mg/dL
SPECIFIC GRAVITY, URINE: 1.024 (ref 1.005–1.030)
Urobilinogen, UA: 1 mg/dL (ref 0.0–1.0)

## 2013-09-21 MED ORDER — PREDNISONE 50 MG PO TABS: ORAL_TABLET | ORAL | Status: DC

## 2013-09-21 MED ORDER — HYDROCODONE-ACETAMINOPHEN 5-325 MG PO TABS: 1.0000 | ORAL_TABLET | ORAL | Status: DC | PRN

## 2013-09-21 NOTE — Discharge Instructions (Signed)
Back Pain, Adult Low back pain is very common. About 1 in 5 people have back pain.The cause of low back pain is rarely dangerous. The pain often gets better over time.About half of people with a sudden onset of back pain feel better in just 2 weeks. About 8 in 10 people feel better by 6 weeks.  CAUSES Some common causes of back pain include:  Strain of the muscles or ligaments supporting the spine.  Wear and tear (degeneration) of the spinal discs.  Arthritis.  Direct injury to the back. DIAGNOSIS Most of the time, the direct cause of low back pain is not known.However, back pain can be treated effectively even when the exact cause of the pain is unknown.Answering your caregiver's questions about your overall health and symptoms is one of the most accurate ways to make sure the cause of your pain is not dangerous. If your caregiver needs more information, he or she may order lab work or imaging tests (X-rays or MRIs).However, even if imaging tests show changes in your back, this usually does not require surgery. HOME CARE INSTRUCTIONS For many people, back pain returns.Since low back pain is rarely dangerous, it is often a condition that people can learn to manageon their own.   Remain active. It is stressful on the back to sit or stand in one place. Do not sit, drive, or stand in one place for more than 30 minutes at a time. Take short walks on level surfaces as soon as pain allows.Try to increase the length of time you walk each day.  Do not stay in bed.Resting more than 1 or 2 days can delay your recovery.  Do not avoid exercise or work.Your body is made to move.It is not dangerous to be active, even though your back may hurt.Your back will likely heal faster if you return to being active before your pain is gone.  Pay attention to your body when you bend and lift. Many people have less discomfortwhen lifting if they bend their knees, keep the load close to their bodies,and  avoid twisting. Often, the most comfortable positions are those that put less stress on your recovering back.  Find a comfortable position to sleep. Use a firm mattress and lie on your side with your knees slightly bent. If you lie on your back, put a pillow under your knees.  Only take over-the-counter or prescription medicines as directed by your caregiver. Over-the-counter medicines to reduce pain and inflammation are often the most helpful.Your caregiver may prescribe muscle relaxant drugs.These medicines help dull your pain so you can more quickly return to your normal activities and healthy exercise.  Put ice on the injured area.  Put ice in a plastic bag.  Place a towel between your skin and the bag.  Leave the ice on for 15-20 minutes, 03-04 times a day for the first 2 to 3 days. After that, ice and heat may be alternated to reduce pain and spasms.  Ask your caregiver about trying back exercises and gentle massage. This may be of some benefit.  Avoid feeling anxious or stressed.Stress increases muscle tension and can worsen back pain.It is important to recognize when you are anxious or stressed and learn ways to manage it.Exercise is a great option. SEEK MEDICAL CARE IF:  You have pain that is not relieved with rest or medicine.  You have pain that does not improve in 1 week.  You have new symptoms.  You are generally not feeling well. SEEK   IMMEDIATE MEDICAL CARE IF:   You have pain that radiates from your back into your legs.  You develop new bowel or bladder control problems.  You have unusual weakness or numbness in your arms or legs.  You develop nausea or vomiting.  You develop abdominal pain.  You feel faint. Document Released: 01/23/2005 Document Revised: 07/25/2011 Document Reviewed: 05/27/2013 Exit[employer] Patient Information 2015 [employer]. This information is not intended to replace advice given to you by your health care provider. Make sure you  discuss any questions you have with your health care provider.  

## 2013-09-21 NOTE — ED Notes (Addendum)
Pt reports lower back pain x 3-4 weeks, woke up yesterday with pain to right hip and "his back gives out on him" reports difficulty ambulating. Pain is relieved when he sits down.

## 2013-09-21 NOTE — ED Provider Notes (Signed)
CSN: [id number]     Arrival date & time 09/21/13  1451 History   First MD Initiated Contact with Patient 09/21/13 1617     Chief Complaint  Patient presents with  . Back Pain  . Hip Pain     (Consider location/radiation/quality/duration/timing/severity/associated sxs/prior Treatment) HPI Comments: Patient here complaining of 2 months of lower back pain that is worse with ambulation and better with rest. Patient states pain goes to his hips but does not go down to his feet. No loss of bowel or bladder function. No saddle anesthesias. No urinary retention. Denies any rashes to his back. Denies any dysuria or hematuria. Pain characterized as sharp it starts in his bilateral flanks. Better with remaining still. No treatment used prior to arrival.  Patient is a 80 y.o. male presenting with back pain and hip pain. The history is provided by the patient.  Back Pain Hip Pain    Past Medical History  Diagnosis Date  . Cancer   . Renal cancer   . Bladder cancer   . Stroke   . Hypertension   . High cholesterol   . Acid reflux    Past Surgical History  Procedure Laterality Date  . Nephrectomy     History reviewed. No pertinent family history. History  Substance Use Topics  . Smoking status: Not on file  . Smokeless tobacco: Not on file  . Alcohol Use: No    Review of Systems  Musculoskeletal: Positive for back pain.  All other systems reviewed and are negative.     Allergies  Morphine  Home Medications   Prior to Admission medications   Medication Sig Start Date End Date Taking? Authorizing Provider  amLODipine (NORVASC) 5 MG tablet Take 5 mg by mouth daily. 08/01/13  Yes Historical Provider, MD  atorvastatin (LIPITOR) 10 MG tablet Take 10 mg by mouth daily. 08/01/13   Historical Provider, MD   BP 122/71  Pulse 65  Temp(Src) 98.1 F (36.7 C) (Oral)  Resp 20  SpO2 95% Physical Exam  Nursing note and vitals reviewed. Constitutional: He is oriented to person, place,  and time. He appears well-developed and well-nourished.  Non-toxic appearance. No distress.  HENT:  Head: Normocephalic and atraumatic.  Eyes: Conjunctivae, EOM and lids are normal. Pupils are equal, round, and reactive to light.  Neck: Normal range of motion. Neck supple. No tracheal deviation present. No mass present.  Cardiovascular: Normal rate, regular rhythm and normal heart sounds.  Exam reveals no gallop.   No murmur heard. Pulmonary/Chest: Effort normal and breath sounds normal. No stridor. No respiratory distress. He has no decreased breath sounds. He has no wheezes. He has no rhonchi. He has no rales.  Abdominal: Soft. Normal appearance and bowel sounds are normal. He exhibits no distension. There is no tenderness. There is no rebound and no CVA tenderness.  Musculoskeletal: Normal range of motion. He exhibits no edema and no tenderness.       Arms: Neurological: He is alert and oriented to person, place, and time. He has normal strength. No cranial nerve deficit or sensory deficit. Gait normal. GCS eye subscore is 4. GCS verbal subscore is 5. GCS motor subscore is 6.  Skin: Skin is warm and dry. No abrasion and no rash noted.  Psychiatric: He has a normal mood and affect. His speech is normal and behavior is normal.    ED Course  Procedures (including critical care time) Labs Review Labs Reviewed - No data to display  Imaging Review  No results found.   EKG Interpretation None      MDM   Final diagnoses:  None    Pt with neg xrays and urine, neuro intact, will tx for djd spine pain    Leota Jacobsen, MD 09/24/13 1005

## 2013-09-24 DIAGNOSIS — H35059 Retinal neovascularization, unspecified, unspecified eye: Secondary | ICD-10-CM | POA: Diagnosis not present

## 2013-09-24 DIAGNOSIS — H353290 Exudative age-related macular degeneration, unspecified eye, stage unspecified: Secondary | ICD-10-CM | POA: Diagnosis not present

## 2013-09-24 DIAGNOSIS — H353190 Nonexudative age-related macular degeneration, unspecified eye, stage unspecified: Secondary | ICD-10-CM | POA: Diagnosis not present

## 2013-09-24 DIAGNOSIS — H43819 Vitreous degeneration, unspecified eye: Secondary | ICD-10-CM | POA: Diagnosis not present

## 2013-10-22 DIAGNOSIS — H353290 Exudative age-related macular degeneration, unspecified eye, stage unspecified: Secondary | ICD-10-CM | POA: Diagnosis not present

## 2013-10-22 DIAGNOSIS — H35059 Retinal neovascularization, unspecified, unspecified eye: Secondary | ICD-10-CM | POA: Diagnosis not present

## 2013-11-30 ENCOUNTER — Emergency Department (HOSPITAL_COMMUNITY): Payer: Medicare Other

## 2013-11-30 ENCOUNTER — Observation Stay (HOSPITAL_COMMUNITY)
Admission: EM | Admit: 2013-11-30 | Discharge: 2013-12-01 | Disposition: A | Discharge status: Home / Self Care | Payer: Medicare Other | Attending: Emergency Medicine | Admitting: Emergency Medicine

## 2013-11-30 ENCOUNTER — Encounter (HOSPITAL_COMMUNITY): Payer: Self-pay | Admitting: Emergency Medicine

## 2013-11-30 DIAGNOSIS — R404 Transient alteration of awareness: Secondary | ICD-10-CM | POA: Diagnosis not present

## 2013-11-30 DIAGNOSIS — N183 Chronic kidney disease, stage 3 unspecified: Secondary | ICD-10-CM

## 2013-11-30 DIAGNOSIS — H3530 Unspecified macular degeneration: Secondary | ICD-10-CM | POA: Diagnosis present

## 2013-11-30 DIAGNOSIS — Z87891 Personal history of nicotine dependence: Secondary | ICD-10-CM | POA: Insufficient documentation

## 2013-11-30 DIAGNOSIS — Z85528 Personal history of other malignant neoplasm of kidney: Secondary | ICD-10-CM | POA: Insufficient documentation

## 2013-11-30 DIAGNOSIS — R531 Weakness: Secondary | ICD-10-CM | POA: Diagnosis not present

## 2013-11-30 DIAGNOSIS — K219 Gastro-esophageal reflux disease without esophagitis: Secondary | ICD-10-CM | POA: Diagnosis not present

## 2013-11-30 DIAGNOSIS — E780 Pure hypercholesterolemia: Secondary | ICD-10-CM | POA: Diagnosis not present

## 2013-11-30 DIAGNOSIS — I10 Essential (primary) hypertension: Secondary | ICD-10-CM | POA: Diagnosis not present

## 2013-11-30 DIAGNOSIS — R410 Disorientation, unspecified: Secondary | ICD-10-CM | POA: Diagnosis not present

## 2013-11-30 DIAGNOSIS — E785 Hyperlipidemia, unspecified: Secondary | ICD-10-CM | POA: Diagnosis not present

## 2013-11-30 DIAGNOSIS — Z79899 Other long term (current) drug therapy: Secondary | ICD-10-CM | POA: Diagnosis not present

## 2013-11-30 DIAGNOSIS — R42 Dizziness and giddiness: Secondary | ICD-10-CM | POA: Diagnosis not present

## 2013-11-30 DIAGNOSIS — Z8551 Personal history of malignant neoplasm of bladder: Secondary | ICD-10-CM | POA: Diagnosis not present

## 2013-11-30 DIAGNOSIS — Z8673 Personal history of transient ischemic attack (TIA), and cerebral infarction without residual deficits: Secondary | ICD-10-CM | POA: Insufficient documentation

## 2013-11-30 DIAGNOSIS — F0390 Unspecified dementia without behavioral disturbance: Secondary | ICD-10-CM | POA: Diagnosis present

## 2013-11-30 DIAGNOSIS — J42 Unspecified chronic bronchitis: Secondary | ICD-10-CM | POA: Diagnosis not present

## 2013-11-30 LAB — CBC WITH DIFFERENTIAL/PLATELET
Basophils Absolute: 0 10*3/uL (ref 0.0–0.1)
Basophils Relative: 0 % (ref 0–1)
Eosinophils Absolute: 0.1 10*3/uL (ref 0.0–0.7)
Eosinophils Relative: 2 % (ref 0–5)
HEMATOCRIT: 40.8 % (ref 39.0–52.0)
HEMOGLOBIN: 14.3 g/dL (ref 13.0–17.0)
LYMPHS ABS: 0.9 10*3/uL (ref 0.7–4.0)
Lymphocytes Relative: 11 % — ABNORMAL LOW (ref 12–46)
MCH: 32.7 pg (ref 26.0–34.0)
MCHC: 35 g/dL (ref 30.0–36.0)
MCV: 93.4 fL (ref 78.0–100.0)
MONO ABS: 0.7 10*3/uL (ref 0.1–1.0)
MONOS PCT: 9 % (ref 3–12)
NEUTROS ABS: 6.3 10*3/uL (ref 1.7–7.7)
NEUTROS PCT: 78 % — AB (ref 43–77)
Platelets: 140 10*3/uL — ABNORMAL LOW (ref 150–400)
RBC: 4.37 MIL/uL (ref 4.22–5.81)
RDW: 12.8 % (ref 11.5–15.5)
WBC: 8 10*3/uL (ref 4.0–10.5)

## 2013-11-30 LAB — COMPREHENSIVE METABOLIC PANEL
ALK PHOS: 116 U/L (ref 39–117)
ALT: 19 U/L (ref 0–53)
ANION GAP: 15 (ref 5–15)
AST: 22 U/L (ref 0–37)
Albumin: 3.3 g/dL — ABNORMAL LOW (ref 3.5–5.2)
BILIRUBIN TOTAL: 0.6 mg/dL (ref 0.3–1.2)
BUN: 17 mg/dL (ref 6–23)
CHLORIDE: 99 meq/L (ref 96–112)
CO2: 24 mEq/L (ref 19–32)
CREATININE: 1.42 mg/dL — AB (ref 0.50–1.35)
Calcium: 8.9 mg/dL (ref 8.4–10.5)
GFR, EST AFRICAN AMERICAN: 49 mL/min — AB (ref >90)
GFR, EST NON AFRICAN AMERICAN: 43 mL/min — AB (ref >90)
GLUCOSE: 168 mg/dL — AB (ref 70–99)
POTASSIUM: 4 meq/L (ref 3.7–5.3)
Sodium: 138 mEq/L (ref 137–147)
Total Protein: 6.9 g/dL (ref 6.0–8.3)

## 2013-11-30 LAB — URINALYSIS, ROUTINE W REFLEX MICROSCOPIC
BILIRUBIN URINE: NEGATIVE
Glucose, UA: NEGATIVE mg/dL
Hgb urine dipstick: NEGATIVE
KETONES UR: NEGATIVE mg/dL
LEUKOCYTES UA: NEGATIVE
NITRITE: NEGATIVE
PROTEIN: NEGATIVE mg/dL
Specific Gravity, Urine: 1.01 (ref 1.005–1.030)
UROBILINOGEN UA: 1 mg/dL (ref 0.0–1.0)
pH: 6 (ref 5.0–8.0)

## 2013-11-30 LAB — TSH: TSH: 0.936 u[IU]/mL (ref 0.350–4.500)

## 2013-11-30 LAB — TROPONIN I: Troponin I: <0.3 ng/mL (ref <0.30)

## 2013-11-30 MED ORDER — SERTRALINE HCL 100 MG PO TABS
100.0000 mg | ORAL_TABLET | Freq: Every day | ORAL | Status: DC
  Administered 2013-11-30 – 2013-12-01 (×2): 100 mg via ORAL
  Filled 2013-11-30: qty 1
  Filled 2013-11-30: qty 2

## 2013-11-30 MED ORDER — AMLODIPINE BESYLATE 5 MG PO TABS
5.0000 mg | ORAL_TABLET | ORAL | Status: DC
  Administered 2013-11-30: 5 mg via ORAL
  Filled 2013-11-30: qty 1

## 2013-11-30 MED ORDER — CHOLESTYRAMINE 4 G PO PACK
4.0000 g | PACK | Freq: Two times a day (BID) | ORAL | Status: DC
  Administered 2013-12-01: 4 g via ORAL
  Filled 2013-11-30 (×3): qty 1

## 2013-11-30 MED ORDER — QUETIAPINE FUMARATE 25 MG PO TABS
25.0000 mg | ORAL_TABLET | Freq: Every day | ORAL | Status: DC
  Administered 2013-11-30: 25 mg via ORAL
  Filled 2013-11-30 (×2): qty 1

## 2013-11-30 MED ORDER — ACETAMINOPHEN 325 MG PO TABS: 650.0000 mg | ORAL_TABLET | Freq: Four times a day (QID) | ORAL | Status: DC | PRN

## 2013-11-30 MED ORDER — SODIUM CHLORIDE 0.9 % IV BOLUS (SEPSIS)
500.0000 mL | Freq: Once | INTRAVENOUS | Status: AC
  Administered 2013-11-30: 500 mL via INTRAVENOUS

## 2013-11-30 MED ORDER — LIDOCAINE HCL 2 % IJ SOLN: 5.0000 mL | Freq: Once | INTRAMUSCULAR | Status: DC

## 2013-11-30 MED ORDER — BUPIVACAINE HCL 0.5 % IJ SOLN: 5.0000 mL | Freq: Once | INTRAMUSCULAR | Status: DC

## 2013-11-30 MED ORDER — ONDANSETRON HCL 4 MG PO TABS: 4.0000 mg | ORAL_TABLET | Freq: Four times a day (QID) | ORAL | Status: DC | PRN

## 2013-11-30 MED ORDER — SODIUM CHLORIDE 0.9 % IJ SOLN: 3.0000 mL | INTRAMUSCULAR | Status: DC | PRN

## 2013-11-30 MED ORDER — LORAZEPAM 2 MG/ML IJ SOLN
1.0000 mg | Freq: Once | INTRAMUSCULAR | Status: AC
  Administered 2013-12-01: 1 mg via INTRAVENOUS
  Filled 2013-11-30: qty 1

## 2013-11-30 MED ORDER — SODIUM CHLORIDE 0.9 % IJ SOLN
3.0000 mL | Freq: Two times a day (BID) | INTRAMUSCULAR | Status: DC
  Administered 2013-11-30: 3 mL via INTRAVENOUS

## 2013-11-30 MED ORDER — ONDANSETRON HCL 4 MG/2ML IJ SOLN: 4.0000 mg | Freq: Four times a day (QID) | INTRAMUSCULAR | Status: DC | PRN

## 2013-11-30 MED ORDER — ENOXAPARIN SODIUM 40 MG/0.4ML ~~LOC~~ SOLN
40.0000 mg | SUBCUTANEOUS | Status: DC
  Administered 2013-11-30: 40 mg via SUBCUTANEOUS
  Filled 2013-11-30 (×2): qty 0.4

## 2013-11-30 MED ORDER — SODIUM CHLORIDE 0.9 % IV SOLN: 250.0000 mL | INTRAVENOUS | Status: DC | PRN

## 2013-11-30 MED ORDER — ACETAMINOPHEN 650 MG RE SUPP: 650.0000 mg | Freq: Four times a day (QID) | RECTAL | Status: DC | PRN

## 2013-11-30 MED ORDER — PANTOPRAZOLE SODIUM 40 MG PO TBEC
40.0000 mg | DELAYED_RELEASE_TABLET | Freq: Every day | ORAL | Status: DC
  Administered 2013-11-30 – 2013-12-01 (×2): 40 mg via ORAL
  Filled 2013-11-30 (×2): qty 1

## 2013-11-30 MED ORDER — ATORVASTATIN CALCIUM 10 MG PO TABS
10.0000 mg | ORAL_TABLET | Freq: Every day | ORAL | Status: DC
  Administered 2013-11-30 – 2013-12-01 (×2): 10 mg via ORAL
  Filled 2013-11-30 (×2): qty 1

## 2013-11-30 NOTE — ED Notes (Signed)
MD Pickering at the bedside 

## 2013-11-30 NOTE — ED Notes (Signed)
Patient transported to X-ray 

## 2013-11-30 NOTE — Progress Notes (Signed)
Pt admitted to 6e06. Transported via stretcher from ED to room. Alert and oriented, but stated it was 2012. 1 assist to ambulate to bathroom. Voided 125 cc, and 1 BM soft, brown. Standing weight completed. VS completed. Noted bruising and skin tear on R forearm, and "benign cyst" at top of L buttocks that pt stated has always been there. No telemetry. Orders reviewed. Will continue to monitor. Manya Silvas, RN

## 2013-11-30 NOTE — ED Notes (Signed)
Patient returned from [place] and CT. Patient placed back on the monitor.

## 2013-11-30 NOTE — ED Notes (Signed)
Per EMS, Patient called for dizziness and weakness. Patient states, He has been eating and drinking like normal, but he has increased dizziness over the past couple days with insomnia for two weeks. Patient was positive with Orth[facility] with EMS. Patient family reports he has not been eating and drinking well and has been just drinking carbonated drinks. Patient has a history of Stroke. Last Vitals per EMS: 126/74, 84 HR, 18 RR, 97%, 204 CBG.

## 2013-11-30 NOTE — ED Notes (Signed)
Internal Medicine at the bedside. 

## 2013-11-30 NOTE — ED Provider Notes (Signed)
CSN: [id number]     Arrival date & time 11/30/13  1156 History   First MD Initiated Contact with Patient 11/30/13 1159     Chief Complaint  Patient presents with  . Dizziness     (Consider location/radiation/quality/duration/timing/severity/associated sxs/prior Treatment) Patient is a 80 y.o. male presenting with dizziness. The history is provided by the patient.  Dizziness Quality:  Lightheadedness Associated symptoms: no chest pain, no diarrhea, no headaches, no nausea, no shortness of breath and no vomiting    patient has had some generalized weakness and some confusion over the last few days. He states he has had a decreased appetite. States he still feels kind of weak all over. He said occasional headache. No chest pain. No cough. He may be urinating a little more frequently, but not significantly. No fevers. No vomiting or diarrhea. His wife states that he has been calling people by the wrong name occasionally. Previous history of renal cancer. Reportedly had his kidney out. Reportedly did not follow-up with urology.  Past Medical History  Diagnosis Date  . Cancer   . Renal cancer   . Bladder cancer   . Stroke   . Hypertension   . High cholesterol   . Acid reflux    Past Surgical History  Procedure Laterality Date  . Nephrectomy     No family history on file. History  Substance Use Topics  . Smoking status: Former Research scientist (life sciences)  . Smokeless tobacco: Not on file  . Alcohol Use: No    Review of Systems  Constitutional: Positive for activity change and fatigue. Negative for chills, diaphoresis and appetite change.  Eyes: Negative for pain.  Respiratory: Negative for chest tightness and shortness of breath.   Cardiovascular: Negative for chest pain and leg swelling.  Gastrointestinal: Negative for nausea, vomiting, abdominal pain and diarrhea.  Genitourinary: Negative for flank pain.  Musculoskeletal: Negative for back pain and neck stiffness.  Skin: Negative for rash.   Neurological: Positive for dizziness. Negative for weakness, numbness and headaches.  Psychiatric/Behavioral: Positive for confusion. Negative for behavioral problems.      Allergies  Morphine  Home Medications   Prior to Admission medications   Medication Sig Start Date End Date Taking? Authorizing Provider  acetaminophen (TYLENOL) 325 MG tablet Take 650 mg by mouth every 6 (six) hours as needed for moderate pain.   Yes Historical Provider, MD  amLODipine (NORVASC) 5 MG tablet Take 5 mg by mouth every other day.  08/01/13  Yes Historical Provider, MD  atorvastatin (LIPITOR) 10 MG tablet Take 10 mg by mouth daily. 08/01/13  Yes Historical Provider, MD  omeprazole (PRILOSEC) 20 MG capsule Take 20 mg by mouth daily. 09/11/13  Yes Historical Provider, MD  sertraline (ZOLOFT) 100 MG tablet Take 100 mg by mouth daily. 08/01/13  Yes Historical Provider, MD   BP 157/89  Pulse 78  Temp(Src) 97.9 F (36.6 C) (Oral)  Resp 14  SpO2 99% Physical Exam  Constitutional: He appears well-developed.  Tongue is very dry.  HENT:  Head: Normocephalic and atraumatic.  Eyes: Pupils are equal, round, and reactive to light.  Neck: Neck supple.  Cardiovascular: Normal rate and regular rhythm.   Pulmonary/Chest: Effort normal.  Abdominal: Soft. There is no tenderness.  Musculoskeletal: He exhibits no tenderness.  Neurological: He is alert.  Skin: Skin is warm.    ED Course  Procedures (including critical care time) Labs Review Labs Reviewed  CBC WITH DIFFERENTIAL - Abnormal; Notable for the following:    Platelets  140 (*)    Neutrophils Relative % 78 (*)    Lymphocytes Relative 11 (*)    All other components within normal limits  COMPREHENSIVE METABOLIC PANEL - Abnormal; Notable for the following:    Glucose, Bld 168 (*)    Creatinine, Ser 1.42 (*)    Albumin 3.3 (*)    GFR calc non Af Amer 43 (*)    GFR calc Af Amer 49 (*)    All other components within normal limits  URINALYSIS, ROUTINE W  REFLEX MICROSCOPIC  TROPONIN I    Imaging Review Dg Chest 2 View  11/30/2013   CLINICAL DATA:  Dizziness, weakness, increasing symptoms over past couple days with insomnia for 2 weeks, orthostatic in ED, history stroke, bladder cancer, renal cancer, hypertension, hypercholesterolemia, former smoker  EXAM: CHEST  2 VIEW  COMPARISON:  04/13/2010 ; correlation CT chest 01/26/2008  FINDINGS:  Normal heart size, mediastinal contours and pulmonary vascularity.  Mild chronic bronchitic and interstitial changes.  Minimal pleural thickening at the lateral mid LEFT hemithorax unchanged, corresponding to calcified BILATERAL pleural plaque disease on prior CT.  Minimal biapical scarring.  No acute infiltrate, pleural effusion or pneumothorax.  Bones demineralized.  IMPRESSION: Chronic bronchitic and interstitial changes associated with calcified pleural plaque formation, raising question of asbestos exposure and potentially asbestosis.  No acute infiltrate.   Electronically Signed   By: Lavonia Dana M.D.   On: 11/30/2013 14:23   Ct Head Wo Contrast  11/30/2013   CLINICAL DATA:  Dizziness and weakness. Dizziness has worsened over the past few days. History of stroke and bladder carcinoma. History of hypertension.  EXAM: CT HEAD WITHOUT CONTRAST  TECHNIQUE: Contiguous axial images were obtained from the base of the skull through the vertex without intravenous contrast.  COMPARISON:  03/20/2010  FINDINGS: Ventricles are normal in configuration. There is ventricular and sulcal enlargement reflecting mild atrophy. There are no parenchymal masses or mass effect. There is no evidence of a cortical infarct. Patchy white matter hypoattenuation is noted consistent with advanced chronic microvascular ischemic change.  No extra-axial masses or abnormal fluid collections.  No intracranial hemorrhage.  Mild right sphenoid sinus mucosal thickening. [place] mucosal thickening of the remaining sinuses. Clear mastoid air cells.   IMPRESSION: 1. No acute intracranial abnormalities. 2. Mild atrophy. Advanced chronic microvascular ischemic change. Stable appearance from the prior exam.   Electronically Signed   By: Lajean Manes M.D.   On: 11/30/2013 14:06     EKG Interpretation   Date/Time:  Sunday November 30 2013 12:10:15 EDT Ventricular Rate:  81 PR Interval:  187 QRS Duration: 128 QT Interval:  410 QTC Calculation: 476 R Axis:   -50 Text Interpretation:  Sinus rhythm Right bundle branch block Confirmed by  Alvino Chapel  MD, Ovid Curd [phone]) on 11/30/2013 1:19:21 PM      MDM   Final diagnoses:  Weakness    Patient with generalized weakness and some confusion. Lateral can imaging overall reassuring. Patient has not been himself per family members. Does have a history of cancer also. Mouth is very dry. Will admit to internal medicine for further monitoring.    Jasper Riling. Alvino Chapel, MD 11/30/13 [phone]

## 2013-11-30 NOTE — ED Notes (Signed)
Patient given [place] [employer] and Sprite.

## 2013-11-30 NOTE — H&P (Addendum)
[facility] History and Physical  Damin Salido Ghazi OZH:[id number] DOB: 1923-04-10 DOA: 11/30/2013    PCP: Vidal Schwalbe, MD    Chief Complaint: generalized weakness  HPI: [name] is a 80 y.o. male who is brought in by his family for increased confusion with some anxiety over the past few weeks. The patient also has been feeling quite weak. He has chronic diarrhea and has been quite incontinent of stool lately. He has not had any abdominal pain, fevers or vomiting. He has maybe lost a couple of pounds in the past few months. Speaking to the patient's daughter and son, she is concerned about his safely of living in home alone during the day. He often gets up at night and feels tired during the day- he has tried low dose Ambien which has not helped.    General: The patient denies anorexia, fever, weight loss Cardiac: Denies chest pain, syncope, palpitations, pedal edema  Respiratory: Denies cough, shortness of breath, wheezing GI: Denies severe indigestion/heartburn, abdominal pain, nausea, vomiting- chronically has loose stools and sometimes incontinent of stool  GU: Denies hematuria, incontinence, dysuria  Musculoskeletal: has occasional back and hip pain Skin: Denies suspicious skin lesions Neurologic: Denies focal weakness or numbness, change in vision  Past Medical History  Diagnosis Date      . Renal cancer   . Bladder cancer   . Stroke   . Hypertension   . High cholesterol   . Acid reflux     Past Surgical History  Procedure Laterality Date  . Nephrectomy      Social History: non-smoker, does drink alcohol Lives at home with daughter and son in low    Allergies  Allergen Reactions  . Morphine Other (See Comments)    Makes patient crazy     No family history on file.    Prior to Admission medications   Medication Sig Start Date End Date Taking? Authorizing Provider  acetaminophen (TYLENOL) 325 MG tablet Take 650 mg by mouth every 6 (six) hours as  needed for moderate pain.   Yes Historical Provider, MD  amLODipine (NORVASC) 5 MG tablet Take 5 mg by mouth every other day.  08/01/13  Yes Historical Provider, MD  atorvastatin (LIPITOR) 10 MG tablet Take 10 mg by mouth daily. 08/01/13  Yes Historical Provider, MD  omeprazole (PRILOSEC) 20 MG capsule Take 20 mg by mouth daily. 09/11/13  Yes Historical Provider, MD  sertraline (ZOLOFT) 100 MG tablet Take 100 mg by mouth daily. 08/01/13  Yes Historical Provider, MD     Physical Exam: Filed Vitals:   11/30/13 1500 11/30/13 1530 11/30/13 1600 11/30/13 1630  BP: 166/84 174/79 188/80 157/89  Pulse: 78 81 78 78  Temp:      TempSrc:      Resp: 18 17 21 14   SpO2: 97% 96% 97% 99%     General: AAO x 3, no distress HEENT: Normocephalic and Atraumatic, Mucous membranes pink                PERRLA; EOM intact; No scleral icterus,                 Nares: Patent, Oropharynx: Clear, Fair Dentition                 Neck: FROM, no cervical lymphadenopathy, thyromegaly, carotid bruit or JVD;  Breasts: deferred CHEST WALL: No tenderness  CHEST: Normal respiration, clear to auscultation bilaterally  HEART: Regular rate and rhythm; no murmurs rubs or gallops  BACK: No kyphosis or scoliosis; no CVA tenderness  ABDOMEN: Positive Bowel Sounds, soft, non-tender; no masses, no organomegaly Rectal Exam: deferred EXTREMITIES: No cyanosis, clubbing, or edema Genitalia: not examined  SKIN:  no rash or ulceration  CNS: Alert and Oriented x 4, Nonfocal exam, CN 2-12 intact  Labs on Admission:  Basic Metabolic Panel:  Recent Labs Lab 11/30/13 1242  NA 138  K 4.0  CL 99  CO2 24  GLUCOSE 168*  BUN 17  CREATININE 1.42*  CALCIUM 8.9   Liver Function Tests:  Recent Labs Lab 11/30/13 1242  AST 22  ALT 19  ALKPHOS 116  BILITOT 0.6  PROT 6.9  ALBUMIN 3.3*   No results found for this basename: LIPASE, AMYLASE,  in the last 168 hours No results found for this basename: AMMONIA,  in the last 168  hours CBC:  Recent Labs Lab 11/30/13 1242  WBC 8.0  NEUTROABS 6.3  HGB 14.3  HCT 40.8  MCV 93.4  PLT 140*   Cardiac Enzymes:  Recent Labs Lab 11/30/13 1242  TROPONINI <0.30    BNP (last 3 results) No results found for this basename: PROBNP,  in the last 8760 hours CBG: No results found for this basename: GLUCAP,  in the last 168 hours  Radiological Exams on Admission: Dg Chest 2 View  11/30/2013   CLINICAL DATA:  Dizziness, weakness, increasing symptoms over past couple days with insomnia for 2 weeks, orthostatic in ED, history stroke, bladder cancer, renal cancer, hypertension, hypercholesterolemia, former smoker  EXAM: CHEST  2 VIEW  COMPARISON:  04/13/2010 ; correlation CT chest 01/26/2008  FINDINGS:  Normal heart size, mediastinal contours and pulmonary vascularity.  Mild chronic bronchitic and interstitial changes.  Minimal pleural thickening at the lateral mid LEFT hemithorax unchanged, corresponding to calcified BILATERAL pleural plaque disease on prior CT.  Minimal biapical scarring.  No acute infiltrate, pleural effusion or pneumothorax.  Bones demineralized.  IMPRESSION: Chronic bronchitic and interstitial changes associated with calcified pleural plaque formation, raising question of asbestos exposure and potentially asbestosis.  No acute infiltrate.   Electronically Signed   By: Lavonia Dana M.D.   On: 11/30/2013 14:23   Ct Head Wo Contrast  11/30/2013   CLINICAL DATA:  Dizziness and weakness. Dizziness has worsened over the past few days. History of stroke and bladder carcinoma. History of hypertension.  EXAM: CT HEAD WITHOUT CONTRAST  TECHNIQUE: Contiguous axial images were obtained from the base of the skull through the vertex without intravenous contrast.  COMPARISON:  03/20/2010  FINDINGS: Ventricles are normal in configuration. There is ventricular and sulcal enlargement reflecting mild atrophy. There are no parenchymal masses or mass effect. There is no evidence of  a cortical infarct. Patchy white matter hypoattenuation is noted consistent with advanced chronic microvascular ischemic change.  No extra-axial masses or abnormal fluid collections.  No intracranial hemorrhage.  Mild right sphenoid sinus mucosal thickening. [place] mucosal thickening of the remaining sinuses. Clear mastoid air cells.  IMPRESSION: 1. No acute intracranial abnormalities. 2. Mild atrophy. Advanced chronic microvascular ischemic change. Stable appearance from the prior exam.   Electronically Signed   By: Lajean Manes M.D.   On: 11/30/2013 14:06    EKG: Independently reviewed. NSR RBBB.   Assessment/Plan Principal Problem:   Confusion - likely developing dementia-  - no signs of infection- UA looks normal -will check TSH, T4, B12 (cbc normal) and RPR - will observe overnight- start low dose Seroquel at bedtime to help with sleep and confusion -  daughter will plan on taking off from work to be with him during the day- they will follow up with his PCP later this week for a cognitive screen.    Active Problems:  Chronic diarrhea - try BID Questran  CKD 3 - stable    HTN (hypertension) - cont Norvasc- follow BP     Hyperlipemia - cont statin    H/O renal cell cancer - s/p nephrectomy    Macular degeneration   Consulted: none   Code Status: full  Family Communication: daughter and son-in-law DVT Prophylaxis: Heparin  Time spent: 40 min  [place], MD [facility]  If 7PM-7AM, please contact night-coverage [email or web address] 11/30/2013, 5:08 PM

## 2013-12-01 DIAGNOSIS — R410 Disorientation, unspecified: Secondary | ICD-10-CM

## 2013-12-01 DIAGNOSIS — R531 Weakness: Secondary | ICD-10-CM | POA: Diagnosis not present

## 2013-12-01 DIAGNOSIS — F0390 Unspecified dementia without behavioral disturbance: Secondary | ICD-10-CM | POA: Diagnosis present

## 2013-12-01 LAB — VITAMIN B12: Vitamin B-12: 629 pg/mL (ref 211–911)

## 2013-12-01 LAB — RPR

## 2013-12-01 LAB — T3, FREE: T3 FREE: 3.7 pg/mL (ref 2.3–4.2)

## 2013-12-01 MED ORDER — QUETIAPINE FUMARATE 25 MG PO TABS: 25.0000 mg | ORAL_TABLET | Freq: Every day | ORAL | Status: DC

## 2013-12-01 MED ORDER — INFLUENZA VAC SPLIT QUAD 0.5 ML IM SUSY
0.5000 mL | PREFILLED_SYRINGE | Freq: Once | INTRAMUSCULAR | Status: DC
  Filled 2013-12-01: qty 0.5

## 2013-12-01 NOTE — Evaluation (Signed)
Physical Therapy Evaluation Patient Details Name: [name] MRN: [id number] DOB: Jul 04, 1923 Today's Date: 12/01/2013   History of Present Illness  Pt is an 80 y.o. male who is brought in by his family for increased confusion with some anxiety and generalized weakness over the past few weeks. He has chronic diarrhea and has been quite incontinent of stool lately.   Clinical Impression  Pt admitted with the above. Pt currently with functional limitations due to the deficits listed below (see PT Problem List). At the time of PT eval pt very unsteady with ambulation and RW was required for safety. Recommending RW for home vs. the "walking stick" that he has at home. Pt will benefit from skilled PT to increase their independence and safety with mobility to allow discharge to the venue listed below.      Follow Up Recommendations Home health PT;Supervision for mobility/OOB    Equipment Recommendations  Rolling walker with 5" wheels    Recommendations for Other Services       Precautions / Restrictions Precautions Precautions: Fall Restrictions Weight Bearing Restrictions: No      Mobility  Bed Mobility Overal bed mobility: Needs Assistance Bed Mobility: Supine to Sit     Supine to sit: Supervision     General bed mobility comments: Supervision for safety. Pt with increased time and heavy use of bed rails to achieve EOB. Required cues to scoot all the way to edge of bed so feet were resting on floor.   Transfers Overall transfer level: Needs assistance Equipment used: 1 person hand held assist Transfers: Sit to/from Stand Sit to Stand: Min guard         General transfer comment: VC's for hand placement on seated surface for safety. Pt required steadying assist only prior to initiating ambulation.   Ambulation/Gait Ambulation/Gait assistance: Min assist;Min guard Ambulation Distance (Feet): 150 Feet Assistive device: 1 person hand held assist;Rolling walker (2  wheeled) Gait Pattern/deviations: Step-through pattern;Decreased stride length;Trendelenburg;Trunk flexed Gait velocity: Decreased Gait velocity interpretation: Below normal speed for age/gender General Gait Details: Pt initially using HHA for support while ambulating. Demonstrated trendelenburg gait deviation and states this is normal for him. Last 50 feet of gait training pt with RW, and appeared much more stable.   Stairs            Wheelchair Mobility    Modified Rankin (Stroke Patients Only)       Balance Overall balance assessment: Needs assistance Sitting-balance support: Feet supported;No upper extremity supported Sitting balance-Leahy Scale: Fair     Standing balance support: No upper extremity supported;During functional activity Standing balance-Leahy Scale: Poor Standing balance comment: Requires steadying assist if no UE support is utilized                             Pertinent Vitals/Pain Pain Assessment: No/denies pain    Home Living Family/patient expects to be discharged to:: Private residence Living Arrangements: Children Available Help at Discharge: Family;Available PRN/intermittently;Available 24 hours/day Type of Home: House Home Access: Stairs to enter Entrance Stairs-Rails: None Entrance Stairs-Number of Steps: 2 Home Layout: One level Home Equipment: Other (comment) (Walking stick) Additional Comments: Family will be there 24 hours the first few days and then he will be by himself during the day after    Prior Function Level of Independence: Independent         Comments: Used walking stick occasionally     Hand Dominance  Dominant Hand: Right    Extremity/Trunk Assessment   Upper Extremity Assessment: Defer to OT evaluation           Lower Extremity Assessment: Generalized weakness (Quads/hams WFL however hip abductors/extensors weak)      Cervical / Trunk Assessment: Kyphotic  Communication    Communication: No difficulties  Cognition Arousal/Alertness: Lethargic Behavior During Therapy: WFL for tasks assessed/performed Overall Cognitive Status: Within Functional Limits for tasks assessed                      General Comments      Exercises        Assessment/Plan    PT Assessment Patient needs continued PT services  PT Diagnosis Difficulty walking;Generalized weakness   PT Problem List Decreased strength;Decreased range of motion;Decreased activity tolerance;Decreased balance;Decreased mobility;Decreased knowledge of use of DME;Decreased safety awareness;Decreased knowledge of precautions  PT Treatment Interventions DME instruction;Gait training;Stair training;Functional mobility training;Therapeutic activities;Therapeutic exercise;Neuromuscular re-education;Patient/family education   PT Goals (Current goals can be found in the Care Plan section) Acute Rehab PT Goals Patient Stated Goal: To return home independently PT Goal Formulation: With patient/family Time For Goal Achievement: 12/08/13 Potential to Achieve Goals: Good    Frequency Min 3X/week   Barriers to discharge        Co-evaluation               End of Session Equipment Utilized During Treatment: Gait belt Activity Tolerance: Patient tolerated treatment well Patient left: in chair;with call bell/phone within reach;with family/visitor present Nurse Communication: Mobility status    Functional Assessment Tool Used: Clinical judgement Functional Limitation: Mobility: Walking and moving around Mobility: Walking and Moving Around Current Status [phone]): At least 20 percent but less than 40 percent impaired, limited or restricted Mobility: Walking and Moving Around Goal Status [phone]): At least 20 percent but less than 40 percent impaired, limited or restricted    Time: 1130-1154 PT Time Calculation (min): 24 min   Charges:   PT Evaluation $Initial PT Evaluation Tier I: 1  Procedure PT Treatments $Gait Training: 8-22 mins   PT G Codes:   Functional Assessment Tool Used: Clinical judgement Functional Limitation: Mobility: Walking and moving around    Rolinda Roan 12/01/2013, 12:22 PM  Rolinda Roan, PT, DPT Acute Rehabilitation Services Pager: [phone]

## 2013-12-01 NOTE — Progress Notes (Signed)
UR completed 

## 2013-12-01 NOTE — Progress Notes (Signed)
CARE MANAGEMENT NOTE 12/01/2013  Patient:  [name], [name]   Account Number:  [id number]  Date Initiated:  12/01/2013  Documentation initiated by:  Lizabeth Leyden  Subjective/Objective Assessment:   admitted with dizziness,AMS, weakness     Action/Plan:   progression of care and discharge planning   Anticipated DC Date:  12/01/2013   Anticipated DC Plan:  [place]  CM consult      [facility] Choice  HOME HEALTH   Choice offered to / List presented to:  C-1 Patient   DME arranged  Vassie Moselle      DME agency  [place] arranged  [place].   Status of service:  Completed, signed off Medicare Important Message given?   (If response is "NO", the following Medicare IM given date fields will be blank) Date Medicare IM given:   Medicare IM given by:   Date Additional Medicare IM given:   Additional Medicare IM given by:    Discharge Disposition:  [place]  Per UR Regulation:    If discussed at Long Length of Stay Meetings, dates discussed:    Comments:  12/01/2013  [place], [place] [phone] CM referral  [facility], OT, RW  Met with patient and daughter regarding home health services. [facility].  Provided phone number for [facility] and [facility] program.  [facility]/ Jackelyn Poling. NCM called with referral for home PT, OT [facility]. NCM called with referral for RW, left voice mail message

## 2013-12-01 NOTE — Evaluation (Signed)
Occupational Therapy Evaluation and Discharge Patient Details Name: ARSHAWN VALDEZ MRN: [id number] DOB: 13-May-1923 Today's Date: 12/01/2013    History of Present Illness Pt is an 80 y.o. male who is brought in by his family for increased confusion with some anxiety and generalized weakness over the past few weeks. He has chronic diarrhea and has been quite incontinent of stool lately.    Clinical Impression   This 80 yo male admitted with above presents to acute OT with decreased balance thus affecting his safety with BADLs. I recommended to him that he use his tub bench and his daughter (per notes) will be with him 24/7 for several days. He will benefit from [place] to make sure he is managing his BADLs and IADLs at an Independent to Mod I level at home. Acute OT will sign off.     Follow Up Recommendations  Home health OT    Equipment Recommendations  None recommended by OT       Precautions / Restrictions Precautions Precautions: Fall Restrictions Weight Bearing Restrictions: No      Mobility Bed Mobility Overal bed mobility: Needs Assistance Bed Mobility: Supine to Sit     Supine to sit: Supervision     General bed mobility comments: Pt up in recliner upon arrival  Transfers Overall transfer level: Needs assistance Equipment used: 1 person hand held assist Transfers: Sit to/from Stand Sit to Stand: Min guard (tends to stand wtih a posterior lean--per grandson he does this at home--he starts to stand up, but just doesn't make it so sits back down and tries again)            Balance Overall balance assessment: Needs assistance Sitting-balance support: Feet supported;No upper extremity supported Sitting balance-Leahy Scale: Good     Standing balance support: Bilateral upper extremity supported Standing balance-Leahy Scale: Poor Standing balance comment: Requires steadying assist if no UE support is utilized                            ADL Overall ADL's  : Needs assistance/impaired Eating/Feeding: Independent;Sitting   Grooming: Min guard;Standing   Upper Body Bathing: Set up;Sitting   Lower Body Bathing: Min guard   Upper Body Dressing : Set up;Sitting   Lower Body Dressing: Min guard;Sit to/from stand   Toilet Transfer: Min guard;Ambulation;RW;Regular Toilet;Grab bars   Toileting- Clothing Manipulation and Hygiene: Min guard;Sit to/from Nurse, children's Details (indicate cue type and reason): Recommended to pt that he use his tub bench for now until his balance is better and he is safe to get into tub to take a bath                   Pertinent Vitals/Pain Pain Assessment: No/denies pain     Hand Dominance Right   Extremity/Trunk Assessment Upper Extremity Assessment Upper Extremity Assessment: Overall WFL for tasks assessed     Communication Communication Communication: No difficulties   Cognition Arousal/Alertness: Awake/alert Behavior During Therapy: WFL for tasks assessed/performed Overall Cognitive Status: Within Functional Limits for tasks assessed                                Home Living Family/patient expects to be discharged to:: Private residence Living Arrangements: Children Available Help at Discharge: Family;Available PRN/intermittently;Available 24 hours/day Type of Home: House Home Access: Stairs to enter [employer] of Steps:  2 Entrance Stairs-Rails: None Home Layout: One level     Bathroom Shower/Tub: Tub/shower unit;Curtain Shower/tub characteristics: Architectural technologist: Standard     Home Equipment: Tub bench (walking stick)   Additional Comments: Family will be there 24 hours the first few days and then he will be by himself during the day after      Prior Functioning/Environment Level of Independence: Independent        Comments: Used walking stick occasionally    OT Diagnosis: Generalized weakness   OT Problem List: Decreased  strength;Impaired balance (sitting and/or standing)      OT Goals(Current goals can be found in the care plan section) Acute Rehab OT Goals Patient Stated Goal: home soon                End of Session Equipment Utilized During Treatment: Gait belt;Rolling walker Nurse Communication:  (needs HHOT)  Activity Tolerance: Patient tolerated treatment well Patient left: in chair;with call bell/phone within reach;with family/visitor present   Time: 1217-1243 OT Time Calculation (min): 26 min Charges:  OT General Charges $OT Visit: 1 Procedure OT Evaluation $Initial OT Evaluation Tier I: 1 Procedure OT Treatments $Self Care/Home Management : 8-22 mins G-Codes: OT G-codes **NOT FOR INPATIENT CLASS** Functional Assessment Tool Used: Clinical observation Functional Limitation: Self care Self Care Current Status (O6767): At least 1 percent but less than 20 percent impaired, limited or restricted Self Care Goal Status (M0947): At least 1 percent but less than 20 percent impaired, limited or restricted Self Care Discharge Status [phone]): At least 1 percent but less than 20 percent impaired, limited or restricted  Almon Register [phone] 12/01/2013, 1:05 PM

## 2013-12-01 NOTE — Discharge Summary (Signed)
Physician Discharge Summary  [name] QMG:[id number] DOB: 08-31-1923 DOA: 11/30/2013  PCP: Vidal Schwalbe, MD  Admit date: 11/30/2013 Discharge date: 12/01/2013  Time spent: 35 minutes  Recommendations for Outpatient Follow-up:  PCP in 1 week for a cognitive screen.    Discharge Diagnoses:  Principal Problem:   Confusion-suspected Dementia Active Problems:   Macular degeneration   HTN (hypertension)   Hyperlipemia   H/O renal cell cancer   CKD (chronic kidney disease) stage 3, GFR 30-59 ml/min   Discharge Condition: stable  Diet recommendation: low sodium  Filed Weights   11/30/13 1900  Weight: 77.248 kg (170 lb 4.8 oz)    History of present illness:  [name] is a 80 y.o. male who is brought in by his family for increased confusion with some anxiety over the past few weeks. The patient also has been feeling quite weak. He has chronic diarrhea and has been quite incontinent of stool lately. He has not had any abdominal pain, fevers or vomiting. He has maybe lost a couple of pounds in the past few months. Speaking to the patient's daughter and son, she is concerned about his safely of living in home alone during the day. He often gets up at night and feels tired during the day- he has tried low dose Ambien which has not helped.      Hospital Course:  Confusion  - suspect Early dementia- family has noted short term memory and some cognitive decline - no signs of infection- UA looks normal, CXR and CT head unremarkable -no evidence of dehydration or electrolyte abnormality -TSH, T4, B12 and RPR normal - started on low dose Seroquel at bedtime to help with sleep and confusion  - Pt/OT eval completed, HH services set up -needs more supervision at home during day time, family to arrange this   CKD 3  - stable   HTN (hypertension)  - cont Norvasc  Hyperlipemia  - cont statin   H/O renal cell cancer  - s/p nephrectomy   Macular degeneration   Discharge  Exam: Filed Vitals:   12/01/13 1016  BP: 106/52  Pulse: 82  Temp: 97.8 F (36.6 C)  Resp: 19    General: AAO to self and place, not to time Cardiovascular: S1S2/RRR Respiratory: CTAB  Discharge Instructions You were cared for by a hospitalist during your hospital stay. If you have any questions about your discharge medications or the care you received while you were in the hospital after you are discharged, you can call the unit and asked to speak with the hospitalist on call if the hospitalist that took care of you is not available. Once you are discharged, your primary care physician will handle any further medical issues. Please note that NO REFILLS for any discharge medications will be authorized once you are discharged, as it is imperative that you return to your primary care physician (or establish a relationship with a primary care physician if you do not have one) for your aftercare needs so that they can reassess your need for medications and monitor your lab values.  Discharge Instructions   Diet - low sodium heart healthy    Complete by:  As directed      Increase activity slowly    Complete by:  As directed           Current Discharge Medication List    START taking these medications   Details  QUEtiapine (SEROQUEL) 25 MG tablet Take 1 tablet (25 mg  total) by mouth at bedtime. Qty: 30 tablet, Refills: 0      CONTINUE these medications which have NOT CHANGED   Details  acetaminophen (TYLENOL) 325 MG tablet Take 650 mg by mouth every 6 (six) hours as needed for moderate pain.    amLODipine (NORVASC) 5 MG tablet Take 5 mg by mouth every other day.     atorvastatin (LIPITOR) 10 MG tablet Take 10 mg by mouth daily.    omeprazole (PRILOSEC) 20 MG capsule Take 20 mg by mouth daily.    sertraline (ZOLOFT) 100 MG tablet Take 100 mg by mouth daily.       Allergies  Allergen Reactions  . Morphine Other (See Comments)    Makes patient crazy    Follow-up  Information   Follow up with WHITE,CYNTHIA S, MD. Schedule an appointment as soon as possible for a visit in 1 week.   Specialty:  Family Medicine   Contact information:   [address], [place] [address] [phone]        The results of significant diagnostics from this hospitalization (including imaging, microbiology, ancillary and laboratory) are listed below for reference.    Significant Diagnostic Studies: Dg Chest 2 View  11/30/2013   CLINICAL DATA:  Dizziness, weakness, increasing symptoms over past couple days with insomnia for 2 weeks, orthostatic in ED, history stroke, bladder cancer, renal cancer, hypertension, hypercholesterolemia, former smoker  EXAM: CHEST  2 VIEW  COMPARISON:  04/13/2010 ; correlation CT chest 01/26/2008  FINDINGS:  Normal heart size, mediastinal contours and pulmonary vascularity.  Mild chronic bronchitic and interstitial changes.  Minimal pleural thickening at the lateral mid LEFT hemithorax unchanged, corresponding to calcified BILATERAL pleural plaque disease on prior CT.  Minimal biapical scarring.  No acute infiltrate, pleural effusion or pneumothorax.  Bones demineralized.  IMPRESSION: Chronic bronchitic and interstitial changes associated with calcified pleural plaque formation, raising question of asbestos exposure and potentially asbestosis.  No acute infiltrate.   Electronically Signed   By: Lavonia Dana M.D.   On: 11/30/2013 14:23   Ct Head Wo Contrast  11/30/2013   CLINICAL DATA:  Dizziness and weakness. Dizziness has worsened over the past few days. History of stroke and bladder carcinoma. History of hypertension.  EXAM: CT HEAD WITHOUT CONTRAST  TECHNIQUE: Contiguous axial images were obtained from the base of the skull through the vertex without intravenous contrast.  COMPARISON:  03/20/2010  FINDINGS: Ventricles are normal in configuration. There is ventricular and sulcal enlargement reflecting mild atrophy. There are no  parenchymal masses or mass effect. There is no evidence of a cortical infarct. Patchy white matter hypoattenuation is noted consistent with advanced chronic microvascular ischemic change.  No extra-axial masses or abnormal fluid collections.  No intracranial hemorrhage.  Mild right sphenoid sinus mucosal thickening. Nulty mucosal thickening of the remaining sinuses. Clear mastoid air cells.  IMPRESSION: 1. No acute intracranial abnormalities. 2. Mild atrophy. Advanced chronic microvascular ischemic change. Stable appearance from the prior exam.   Electronically Signed   By: Lajean Manes M.D.   On: 11/30/2013 14:06    Microbiology: No results found for this or any previous visit (from the past 240 hour(s)).   Labs: Basic Metabolic Panel:  Recent Labs Lab 11/30/13 1242  NA 138  K 4.0  CL 99  CO2 24  GLUCOSE 168*  BUN 17  CREATININE 1.42*  CALCIUM 8.9   Liver Function Tests:  Recent Labs Lab 11/30/13 1242  AST 22  ALT 19  ALKPHOS 116  BILITOT 0.6  PROT 6.9  ALBUMIN 3.3*   No results found for this basename: LIPASE, AMYLASE,  in the last 168 hours No results found for this basename: AMMONIA,  in the last 168 hours CBC:  Recent Labs Lab 11/30/13 1242  WBC 8.0  NEUTROABS 6.3  HGB 14.3  HCT 40.8  MCV 93.4  PLT 140*   Cardiac Enzymes:  Recent Labs Lab 11/30/13 1242  TROPONINI <0.30   BNP: BNP (last 3 results) No results found for this basename: PROBNP,  in the last 8760 hours CBG: No results found for this basename: GLUCAP,  in the last 168 hours     Signed:  Mary Secord  [facility] 12/01/2013, 10:51 AM

## 2013-12-03 DIAGNOSIS — Z8673 Personal history of transient ischemic attack (TIA), and cerebral infarction without residual deficits: Secondary | ICD-10-CM | POA: Diagnosis not present

## 2013-12-03 DIAGNOSIS — K219 Gastro-esophageal reflux disease without esophagitis: Secondary | ICD-10-CM | POA: Diagnosis not present

## 2013-12-03 DIAGNOSIS — C649 Malignant neoplasm of unspecified kidney, except renal pelvis: Secondary | ICD-10-CM | POA: Diagnosis not present

## 2013-12-03 DIAGNOSIS — R269 Unspecified abnormalities of gait and mobility: Secondary | ICD-10-CM | POA: Diagnosis not present

## 2013-12-03 DIAGNOSIS — M6281 Muscle weakness (generalized): Secondary | ICD-10-CM | POA: Diagnosis not present

## 2013-12-03 DIAGNOSIS — E785 Hyperlipidemia, unspecified: Secondary | ICD-10-CM | POA: Diagnosis not present

## 2013-12-03 DIAGNOSIS — C679 Malignant neoplasm of bladder, unspecified: Secondary | ICD-10-CM | POA: Diagnosis not present

## 2013-12-03 DIAGNOSIS — F419 Anxiety disorder, unspecified: Secondary | ICD-10-CM | POA: Diagnosis not present

## 2013-12-04 DIAGNOSIS — F419 Anxiety disorder, unspecified: Secondary | ICD-10-CM | POA: Diagnosis not present

## 2013-12-04 DIAGNOSIS — K219 Gastro-esophageal reflux disease without esophagitis: Secondary | ICD-10-CM | POA: Diagnosis not present

## 2013-12-04 DIAGNOSIS — I951 Orthostatic hypotension: Secondary | ICD-10-CM | POA: Diagnosis not present

## 2013-12-04 DIAGNOSIS — I129 Hypertensive chronic kidney disease with stage 1 through stage 4 chronic kidney disease, or unspecified chronic kidney disease: Secondary | ICD-10-CM | POA: Diagnosis not present

## 2013-12-04 DIAGNOSIS — N183 Chronic kidney disease, stage 3 (moderate): Secondary | ICD-10-CM | POA: Diagnosis not present

## 2013-12-04 DIAGNOSIS — E785 Hyperlipidemia, unspecified: Secondary | ICD-10-CM | POA: Diagnosis not present

## 2013-12-04 DIAGNOSIS — Z85528 Personal history of other malignant neoplasm of kidney: Secondary | ICD-10-CM | POA: Diagnosis not present

## 2013-12-04 DIAGNOSIS — F0390 Unspecified dementia without behavioral disturbance: Secondary | ICD-10-CM | POA: Diagnosis not present

## 2013-12-08 ENCOUNTER — Encounter (HOSPITAL_COMMUNITY): Payer: Self-pay | Admitting: Emergency Medicine

## 2013-12-08 ENCOUNTER — Emergency Department (HOSPITAL_COMMUNITY): Payer: Medicare Other

## 2013-12-08 ENCOUNTER — Emergency Department (HOSPITAL_COMMUNITY)
Admission: EM | Admit: 2013-12-08 | Discharge: 2013-12-08 | Disposition: A | Discharge status: Home / Self Care | Payer: Medicare Other | Attending: Emergency Medicine | Admitting: Emergency Medicine

## 2013-12-08 DIAGNOSIS — R339 Retention of urine, unspecified: Secondary | ICD-10-CM | POA: Diagnosis present

## 2013-12-08 DIAGNOSIS — Z8551 Personal history of malignant neoplasm of bladder: Secondary | ICD-10-CM | POA: Diagnosis not present

## 2013-12-08 DIAGNOSIS — Z8673 Personal history of transient ischemic attack (TIA), and cerebral infarction without residual deficits: Secondary | ICD-10-CM | POA: Diagnosis not present

## 2013-12-08 DIAGNOSIS — Z87891 Personal history of nicotine dependence: Secondary | ICD-10-CM | POA: Diagnosis not present

## 2013-12-08 DIAGNOSIS — Z79899 Other long term (current) drug therapy: Secondary | ICD-10-CM | POA: Insufficient documentation

## 2013-12-08 DIAGNOSIS — E780 Pure hypercholesterolemia: Secondary | ICD-10-CM | POA: Diagnosis not present

## 2013-12-08 DIAGNOSIS — I10 Essential (primary) hypertension: Secondary | ICD-10-CM | POA: Diagnosis not present

## 2013-12-08 DIAGNOSIS — K219 Gastro-esophageal reflux disease without esophagitis: Secondary | ICD-10-CM | POA: Insufficient documentation

## 2013-12-08 DIAGNOSIS — Z85048 Personal history of other malignant neoplasm of rectum, rectosigmoid junction, and anus: Secondary | ICD-10-CM | POA: Insufficient documentation

## 2013-12-08 DIAGNOSIS — R269 Unspecified abnormalities of gait and mobility: Secondary | ICD-10-CM | POA: Diagnosis not present

## 2013-12-08 DIAGNOSIS — F419 Anxiety disorder, unspecified: Secondary | ICD-10-CM | POA: Diagnosis not present

## 2013-12-08 DIAGNOSIS — C679 Malignant neoplasm of bladder, unspecified: Secondary | ICD-10-CM | POA: Diagnosis not present

## 2013-12-08 DIAGNOSIS — R5383 Other fatigue: Secondary | ICD-10-CM | POA: Diagnosis not present

## 2013-12-08 DIAGNOSIS — R40241 Glasgow coma scale score 13-15: Secondary | ICD-10-CM | POA: Diagnosis not present

## 2013-12-08 DIAGNOSIS — M6281 Muscle weakness (generalized): Secondary | ICD-10-CM | POA: Diagnosis not present

## 2013-12-08 DIAGNOSIS — C649 Malignant neoplasm of unspecified kidney, except renal pelvis: Secondary | ICD-10-CM | POA: Diagnosis not present

## 2013-12-08 DIAGNOSIS — IMO0001 Reserved for inherently not codable concepts without codable children: Secondary | ICD-10-CM

## 2013-12-08 LAB — COMPREHENSIVE METABOLIC PANEL
ALT: 27 U/L (ref 0–53)
AST: 31 U/L (ref 0–37)
Albumin: 3.2 g/dL — ABNORMAL LOW (ref 3.5–5.2)
Alkaline Phosphatase: 112 U/L (ref 39–117)
Anion gap: 17 — ABNORMAL HIGH (ref 5–15)
BILIRUBIN TOTAL: 0.5 mg/dL (ref 0.3–1.2)
BUN: 18 mg/dL (ref 6–23)
CALCIUM: 9.1 mg/dL (ref 8.4–10.5)
CO2: 21 meq/L (ref 19–32)
CREATININE: 1.31 mg/dL (ref 0.50–1.35)
Chloride: 97 mEq/L (ref 96–112)
GFR calc non Af Amer: 47 mL/min — ABNORMAL LOW (ref >90)
GFR, EST AFRICAN AMERICAN: 54 mL/min — AB (ref >90)
Glucose, Bld: 133 mg/dL — ABNORMAL HIGH (ref 70–99)
Potassium: 3.8 mEq/L (ref 3.7–5.3)
Sodium: 135 mEq/L — ABNORMAL LOW (ref 137–147)
Total Protein: 7.3 g/dL (ref 6.0–8.3)

## 2013-12-08 LAB — URINALYSIS, ROUTINE W REFLEX MICROSCOPIC
BILIRUBIN URINE: NEGATIVE
GLUCOSE, UA: NEGATIVE mg/dL
HGB URINE DIPSTICK: NEGATIVE
KETONES UR: NEGATIVE mg/dL
Leukocytes, UA: NEGATIVE
NITRITE: NEGATIVE
PH: 5.5 (ref 5.0–8.0)
Protein, ur: NEGATIVE mg/dL
Specific Gravity, Urine: 1.013 (ref 1.005–1.030)
Urobilinogen, UA: 1 mg/dL (ref 0.0–1.0)

## 2013-12-08 LAB — DIFFERENTIAL
Basophils Absolute: 0 10*3/uL (ref 0.0–0.1)
Basophils Relative: 0 % (ref 0–1)
EOS PCT: 3 % (ref 0–5)
Eosinophils Absolute: 0.3 10*3/uL (ref 0.0–0.7)
LYMPHS ABS: 1.5 10*3/uL (ref 0.7–4.0)
LYMPHS PCT: 15 % (ref 12–46)
MONO ABS: 1.1 10*3/uL — AB (ref 0.1–1.0)
Monocytes Relative: 11 % (ref 3–12)
Neutro Abs: 7.5 10*3/uL (ref 1.7–7.7)
Neutrophils Relative %: 71 % (ref 43–77)

## 2013-12-08 LAB — I-STAT TROPONIN, ED: Troponin i, poc: 0.02 ng/mL (ref 0.00–0.08)

## 2013-12-08 LAB — CBC
HEMATOCRIT: 42.8 % (ref 39.0–52.0)
Hemoglobin: 15.3 g/dL (ref 13.0–17.0)
MCH: 33.4 pg (ref 26.0–34.0)
MCHC: 35.7 g/dL (ref 30.0–36.0)
MCV: 93.4 fL (ref 78.0–100.0)
Platelets: 179 10*3/uL (ref 150–400)
RBC: 4.58 MIL/uL (ref 4.22–5.81)
RDW: 12.8 % (ref 11.5–15.5)
WBC: 10.4 10*3/uL (ref 4.0–10.5)

## 2013-12-08 LAB — PROTIME-INR
INR: 1.12 (ref 0.00–1.49)
Prothrombin Time: 14.5 seconds (ref 11.6–15.2)

## 2013-12-08 LAB — APTT: aPTT: 32 seconds (ref 24–37)

## 2013-12-08 NOTE — ED Notes (Signed)
Pt able to urinate using bedside urinal---- voided approximately 450 ml of pale-yellow, clear urine.

## 2013-12-08 NOTE — Discharge Instructions (Signed)

## 2013-12-08 NOTE — ED Provider Notes (Signed)
CSN: [id number]     Arrival date & time 12/08/13  1738 History   First MD Initiated Contact with Patient 12/08/13 1803     Chief Complaint  Patient presents with  . Urinary Retention    HPI Pt was in the hospital last week at [facility] for weakness.  Since coming home he has continued to feel weak.  Family states he has not been acting his normal self still.  Pt has been drinking fluids well but they have noticed that he has not urinated much since yesterday if anything at all.  No fevers, no vomiting.  No abdominal pain or chest pain.  He seems weak when he tries to stand .  No slurred speech.  No unilateral weakness.  No trouble with coordination. Past Medical History  Diagnosis Date  . Cancer   . Renal cancer   . Bladder cancer   . Stroke   . Hypertension   . High cholesterol   . Acid reflux    Past Surgical History  Procedure Laterality Date  . Nephrectomy     History reviewed. No pertinent family history. History  Substance Use Topics  . Smoking status: Former Research scientist (life sciences)  . Smokeless tobacco: Not on file  . Alcohol Use: No    Review of Systems  All other systems reviewed and are negative.     Allergies  Morphine  Home Medications   Prior to Admission medications   Medication Sig Start Date End Date Taking? Authorizing Provider  acetaminophen (TYLENOL) 325 MG tablet Take 650 mg by mouth every 6 (six) hours as needed for moderate pain.   Yes Historical Provider, MD  atorvastatin (LIPITOR) 10 MG tablet Take 10 mg by mouth daily. 08/01/13  Yes Historical Provider, MD  Doxylamine Succinate, Sleep, (UNISOM PO) Take 1 tablet by mouth at bedtime.   Yes Historical Provider, MD  omeprazole (PRILOSEC) 20 MG capsule Take 20 mg by mouth daily. 09/11/13  Yes Historical Provider, MD  QUEtiapine (SEROQUEL) 25 MG tablet Take 1 tablet (25 mg total) by mouth at bedtime. 12/01/13  Yes Domenic Polite, MD  sertraline (ZOLOFT) 100 MG tablet Take 100 mg by mouth daily. 08/01/13  Yes  Historical Provider, MD  amLODipine (NORVASC) 5 MG tablet Take 5 mg by mouth every other day.  08/01/13   Historical Provider, MD   BP 176/86 mmHg  Pulse 90  Temp(Src) 97.6 F (36.4 C) (Oral)  Resp 18  SpO2 97% Physical Exam  Constitutional: He appears well-developed and well-nourished. No distress.  HENT:  Head: Normocephalic and atraumatic.  Right Ear: External ear normal.  Left Ear: External ear normal.  Eyes: Conjunctivae are normal. Right eye exhibits no discharge. Left eye exhibits no discharge. No scleral icterus.  Neck: Neck supple. No tracheal deviation present.  Cardiovascular: Normal rate, regular rhythm and intact distal pulses.   Pulmonary/Chest: Effort normal and breath sounds normal. No stridor. No respiratory distress. He has no wheezes. He has no rales.  Abdominal: Soft. Bowel sounds are normal. He exhibits no distension. There is no tenderness. There is no rebound and no guarding.  Musculoskeletal: He exhibits no edema or tenderness.  Neurological: He is alert. He has normal strength. No cranial nerve deficit (no facial droop, extraocular movements intact, no slurred speech) or sensory deficit. He exhibits normal muscle tone. He displays no seizure activity. Coordination normal.  Equal grip strength bilaterally, equal strength lower extremities, able lift both legs off the bed against resistance  Skin: Skin is warm  and dry. No rash noted.  Psychiatric: He has a normal mood and affect.  Nursing note and vitals reviewed.   ED Course  Procedures (including critical care time) Labs Review Labs Reviewed  DIFFERENTIAL - Abnormal; Notable for the following:    Monocytes Absolute 1.1 (*)    All other components within normal limits  COMPREHENSIVE METABOLIC PANEL - Abnormal; Notable for the following:    Sodium 135 (*)    Glucose, Bld 133 (*)    Albumin 3.2 (*)    GFR calc non Af Amer 47 (*)    GFR calc Af Amer 54 (*)    Anion gap 17 (*)    All other components  within normal limits  URINALYSIS, ROUTINE W REFLEX MICROSCOPIC - Abnormal; Notable for the following:    APPearance CLOUDY (*)    All other components within normal limits  PROTIME-INR  APTT  CBC  I-STAT TROPOININ, ED    Imaging Review Dg Abd Acute W/chest  12/08/2013   CLINICAL DATA:  Acute urinary retention.  History of bladder cancer.  EXAM: ACUTE ABDOMEN SERIES (ABDOMEN 2 VIEW & CHEST 1 VIEW)  COMPARISON:  Chest x-ray 11/30/2013  FINDINGS: The upright chest x-ray demonstrates chronic lung disease with emphysema and possible bronchiectasis. No infiltrates or effusions. The cardiac silhouette, mediastinal and hilar contours are within normal limits and stable.  Two views of the abdomen demonstrate scattered air throughout the small bowel without significant distention. Minimal air and stool in the colon. The soft tissue shadows are grossly maintained. No worrisome calcifications. Surgical changes are noted. I Do not see any definite findings for bladder distention.  IMPRESSION: Chronic lung changes without acute pulmonary findings.  Nonspecific bowel gas pattern.  Possible mild/ early ileus.  No plain film findings to suggest a distended bladder.   Electronically Signed   By: Kalman Jewels M.D.   On: 12/08/2013 19:43     EKG Interpretation   Date/Time:  Monday December 08 2013 18:30:17 EST Ventricular Rate:  74 PR Interval:  177 QRS Duration: 127 QT Interval:  403 QTC Calculation: 447 R Axis:   -51 Text Interpretation:  Sinus arrhythmia RBBB and LAFB No significant change  since last tracing Confirmed by Dane Kopke  MD-J, Raynard Mapps ([id number]) on 12/08/2013  6:34:45 PM     Pt was able to urinate in the ED.  Bladder scan a couple of hours after urinating was less than 200cc.  MDM   Final diagnoses:  Other fatigue    There is no obvious source of the patient's weakness and fatigue. He was recently admitted to the hospital for similar complaints. CT scan of the brain was performed at that time.  he does not have any focal neurologic symptoms.  I doubt stroke or TIA.  Symptoms may be related to deconditioning It is  possible he is recovering from a viral illness.  At this time there does not appear to be any evidence of an acute emergency medical condition and the patient appears stable for discharge with appropriate outpatient follow up.     Dorie Rank, MD 12/08/13 2032

## 2013-12-08 NOTE — ED Notes (Signed)
And In and out was not done, patient urinated on his own

## 2013-12-08 NOTE — ED Notes (Signed)
Bed: WA16 Expected date:  Expected time:  Means of arrival:  Comments: Urinary retention 

## 2013-12-08 NOTE — ED Notes (Signed)
Brought in by EMS from home with c/o urinary retention.  Pt's family reported that pt has had his last urination yesterday afternoon, and since then has had not voided.  Pt denies abdominal pain or discomfort.  Pt has hx of dementia--- current LOC on his norm, per family.

## 2013-12-08 NOTE — ED Notes (Signed)
Pt's bladder scan reading was > 200 ml.

## 2013-12-09 DIAGNOSIS — C649 Malignant neoplasm of unspecified kidney, except renal pelvis: Secondary | ICD-10-CM | POA: Diagnosis not present

## 2013-12-09 DIAGNOSIS — F419 Anxiety disorder, unspecified: Secondary | ICD-10-CM | POA: Diagnosis not present

## 2013-12-09 DIAGNOSIS — R269 Unspecified abnormalities of gait and mobility: Secondary | ICD-10-CM | POA: Diagnosis not present

## 2013-12-09 DIAGNOSIS — C679 Malignant neoplasm of bladder, unspecified: Secondary | ICD-10-CM | POA: Diagnosis not present

## 2013-12-09 DIAGNOSIS — Z8673 Personal history of transient ischemic attack (TIA), and cerebral infarction without residual deficits: Secondary | ICD-10-CM | POA: Diagnosis not present

## 2013-12-09 DIAGNOSIS — M6281 Muscle weakness (generalized): Secondary | ICD-10-CM | POA: Diagnosis not present

## 2013-12-10 DIAGNOSIS — Z8673 Personal history of transient ischemic attack (TIA), and cerebral infarction without residual deficits: Secondary | ICD-10-CM | POA: Diagnosis not present

## 2013-12-10 DIAGNOSIS — F419 Anxiety disorder, unspecified: Secondary | ICD-10-CM | POA: Diagnosis not present

## 2013-12-10 DIAGNOSIS — R269 Unspecified abnormalities of gait and mobility: Secondary | ICD-10-CM | POA: Diagnosis not present

## 2013-12-10 DIAGNOSIS — C679 Malignant neoplasm of bladder, unspecified: Secondary | ICD-10-CM | POA: Diagnosis not present

## 2013-12-10 DIAGNOSIS — C649 Malignant neoplasm of unspecified kidney, except renal pelvis: Secondary | ICD-10-CM | POA: Diagnosis not present

## 2013-12-10 DIAGNOSIS — M6281 Muscle weakness (generalized): Secondary | ICD-10-CM | POA: Diagnosis not present

## 2013-12-11 DIAGNOSIS — R269 Unspecified abnormalities of gait and mobility: Secondary | ICD-10-CM | POA: Diagnosis not present

## 2013-12-11 DIAGNOSIS — F419 Anxiety disorder, unspecified: Secondary | ICD-10-CM | POA: Diagnosis not present

## 2013-12-11 DIAGNOSIS — M6281 Muscle weakness (generalized): Secondary | ICD-10-CM | POA: Diagnosis not present

## 2013-12-11 DIAGNOSIS — Z8673 Personal history of transient ischemic attack (TIA), and cerebral infarction without residual deficits: Secondary | ICD-10-CM | POA: Diagnosis not present

## 2013-12-11 DIAGNOSIS — C679 Malignant neoplasm of bladder, unspecified: Secondary | ICD-10-CM | POA: Diagnosis not present

## 2013-12-11 DIAGNOSIS — C649 Malignant neoplasm of unspecified kidney, except renal pelvis: Secondary | ICD-10-CM | POA: Diagnosis not present

## 2013-12-12 DIAGNOSIS — E785 Hyperlipidemia, unspecified: Secondary | ICD-10-CM | POA: Diagnosis not present

## 2013-12-12 DIAGNOSIS — I129 Hypertensive chronic kidney disease with stage 1 through stage 4 chronic kidney disease, or unspecified chronic kidney disease: Secondary | ICD-10-CM | POA: Diagnosis not present

## 2013-12-12 DIAGNOSIS — F0390 Unspecified dementia without behavioral disturbance: Secondary | ICD-10-CM | POA: Diagnosis not present

## 2013-12-12 DIAGNOSIS — Z8673 Personal history of transient ischemic attack (TIA), and cerebral infarction without residual deficits: Secondary | ICD-10-CM | POA: Diagnosis not present

## 2013-12-12 DIAGNOSIS — Z85528 Personal history of other malignant neoplasm of kidney: Secondary | ICD-10-CM | POA: Diagnosis not present

## 2013-12-12 DIAGNOSIS — F419 Anxiety disorder, unspecified: Secondary | ICD-10-CM | POA: Diagnosis not present

## 2013-12-12 DIAGNOSIS — I951 Orthostatic hypotension: Secondary | ICD-10-CM | POA: Diagnosis not present

## 2013-12-12 DIAGNOSIS — R269 Unspecified abnormalities of gait and mobility: Secondary | ICD-10-CM | POA: Diagnosis not present

## 2013-12-12 DIAGNOSIS — M6281 Muscle weakness (generalized): Secondary | ICD-10-CM | POA: Diagnosis not present

## 2013-12-12 DIAGNOSIS — C649 Malignant neoplasm of unspecified kidney, except renal pelvis: Secondary | ICD-10-CM | POA: Diagnosis not present

## 2013-12-12 DIAGNOSIS — K219 Gastro-esophageal reflux disease without esophagitis: Secondary | ICD-10-CM | POA: Diagnosis not present

## 2013-12-12 DIAGNOSIS — N183 Chronic kidney disease, stage 3 (moderate): Secondary | ICD-10-CM | POA: Diagnosis not present

## 2013-12-12 DIAGNOSIS — Z1389 Encounter for screening for other disorder: Secondary | ICD-10-CM | POA: Diagnosis not present

## 2013-12-12 DIAGNOSIS — C679 Malignant neoplasm of bladder, unspecified: Secondary | ICD-10-CM | POA: Diagnosis not present

## 2013-12-16 ENCOUNTER — Emergency Department (HOSPITAL_COMMUNITY): Payer: Medicare Other

## 2013-12-16 ENCOUNTER — Emergency Department (HOSPITAL_COMMUNITY)
Admission: EM | Admit: 2013-12-16 | Discharge: 2013-12-16 | Disposition: A | Discharge status: Home / Self Care | Payer: Medicare Other | Attending: Emergency Medicine | Admitting: Emergency Medicine

## 2013-12-16 DIAGNOSIS — R531 Weakness: Secondary | ICD-10-CM | POA: Insufficient documentation

## 2013-12-16 DIAGNOSIS — Z85528 Personal history of other malignant neoplasm of kidney: Secondary | ICD-10-CM | POA: Insufficient documentation

## 2013-12-16 DIAGNOSIS — Z8551 Personal history of malignant neoplasm of bladder: Secondary | ICD-10-CM | POA: Diagnosis not present

## 2013-12-16 DIAGNOSIS — Z87448 Personal history of other diseases of urinary system: Secondary | ICD-10-CM | POA: Insufficient documentation

## 2013-12-16 DIAGNOSIS — Z79899 Other long term (current) drug therapy: Secondary | ICD-10-CM | POA: Diagnosis not present

## 2013-12-16 DIAGNOSIS — Z8673 Personal history of transient ischemic attack (TIA), and cerebral infarction without residual deficits: Secondary | ICD-10-CM | POA: Insufficient documentation

## 2013-12-16 DIAGNOSIS — C649 Malignant neoplasm of unspecified kidney, except renal pelvis: Secondary | ICD-10-CM | POA: Diagnosis not present

## 2013-12-16 DIAGNOSIS — K219 Gastro-esophageal reflux disease without esophagitis: Secondary | ICD-10-CM | POA: Insufficient documentation

## 2013-12-16 DIAGNOSIS — R404 Transient alteration of awareness: Secondary | ICD-10-CM | POA: Diagnosis not present

## 2013-12-16 DIAGNOSIS — I10 Essential (primary) hypertension: Secondary | ICD-10-CM | POA: Insufficient documentation

## 2013-12-16 DIAGNOSIS — R410 Disorientation, unspecified: Secondary | ICD-10-CM | POA: Insufficient documentation

## 2013-12-16 DIAGNOSIS — R269 Unspecified abnormalities of gait and mobility: Secondary | ICD-10-CM | POA: Diagnosis not present

## 2013-12-16 DIAGNOSIS — E780 Pure hypercholesterolemia: Secondary | ICD-10-CM | POA: Insufficient documentation

## 2013-12-16 DIAGNOSIS — M6281 Muscle weakness (generalized): Secondary | ICD-10-CM | POA: Diagnosis not present

## 2013-12-16 DIAGNOSIS — C679 Malignant neoplasm of bladder, unspecified: Secondary | ICD-10-CM | POA: Diagnosis not present

## 2013-12-16 DIAGNOSIS — R42 Dizziness and giddiness: Secondary | ICD-10-CM | POA: Insufficient documentation

## 2013-12-16 DIAGNOSIS — F419 Anxiety disorder, unspecified: Secondary | ICD-10-CM | POA: Diagnosis not present

## 2013-12-16 DIAGNOSIS — G9389 Other specified disorders of brain: Secondary | ICD-10-CM | POA: Diagnosis not present

## 2013-12-16 DIAGNOSIS — Z87891 Personal history of nicotine dependence: Secondary | ICD-10-CM | POA: Insufficient documentation

## 2013-12-16 LAB — COMPREHENSIVE METABOLIC PANEL
ALK PHOS: 101 U/L (ref 39–117)
ALT: 17 U/L (ref 0–53)
ANION GAP: 14 (ref 5–15)
AST: 25 U/L (ref 0–37)
Albumin: 3 g/dL — ABNORMAL LOW (ref 3.5–5.2)
BILIRUBIN TOTAL: 0.6 mg/dL (ref 0.3–1.2)
BUN: 17 mg/dL (ref 6–23)
CHLORIDE: 97 meq/L (ref 96–112)
CO2: 27 meq/L (ref 19–32)
Calcium: 9.3 mg/dL (ref 8.4–10.5)
Creatinine, Ser: 1.35 mg/dL (ref 0.50–1.35)
GFR calc Af Amer: 52 mL/min — ABNORMAL LOW (ref >90)
GFR, EST NON AFRICAN AMERICAN: 45 mL/min — AB (ref >90)
Glucose, Bld: 113 mg/dL — ABNORMAL HIGH (ref 70–99)
Potassium: 3.4 mEq/L — ABNORMAL LOW (ref 3.7–5.3)
Sodium: 138 mEq/L (ref 137–147)
Total Protein: 6.8 g/dL (ref 6.0–8.3)

## 2013-12-16 LAB — CBC WITH DIFFERENTIAL/PLATELET
BASOS ABS: 0 10*3/uL (ref 0.0–0.1)
BASOS PCT: 0 % (ref 0–1)
Eosinophils Absolute: 0.1 10*3/uL (ref 0.0–0.7)
Eosinophils Relative: 2 % (ref 0–5)
HCT: 40 % (ref 39.0–52.0)
Hemoglobin: 14 g/dL (ref 13.0–17.0)
Lymphocytes Relative: 15 % (ref 12–46)
Lymphs Abs: 1.1 10*3/uL (ref 0.7–4.0)
MCH: 32.5 pg (ref 26.0–34.0)
MCHC: 35 g/dL (ref 30.0–36.0)
MCV: 92.8 fL (ref 78.0–100.0)
Monocytes Absolute: 0.8 10*3/uL (ref 0.1–1.0)
Monocytes Relative: 10 % (ref 3–12)
Neutro Abs: 5.7 10*3/uL (ref 1.7–7.7)
Neutrophils Relative %: 73 % (ref 43–77)
Platelets: 159 10*3/uL (ref 150–400)
RBC: 4.31 MIL/uL (ref 4.22–5.81)
RDW: 12.7 % (ref 11.5–15.5)
WBC: 7.7 10*3/uL (ref 4.0–10.5)

## 2013-12-16 LAB — TROPONIN I

## 2013-12-16 MED ORDER — LORAZEPAM 1 MG PO TABS: 1.0000 mg | ORAL_TABLET | Freq: Four times a day (QID) | ORAL | Status: DC | PRN

## 2013-12-16 NOTE — Discharge Instructions (Signed)
Follow-up with your primary Dr. This week for recheck, and return to the ER if symptoms substantially worsen or change.   Dizziness Dizziness is a common problem. It is a feeling of unsteadiness or light-headedness. You may feel like you are about to faint. Dizziness can lead to injury if you stumble or fall. A person of any age group can suffer from dizziness, but dizziness is more common in older adults. CAUSES  Dizziness can be caused by many different things, including:  Middle ear problems.  Standing for too long.  Infections.  An allergic reaction.  Aging.  An emotional response to something, such as the sight of blood.  Side effects of medicines.  Tiredness.  Problems with circulation or blood pressure.  Excessive use of alcohol or medicines, or illegal drug use.  Breathing too fast (hyperventilation).  An irregular heart rhythm (arrhythmia).  A low red blood cell count (anemia).  Pregnancy.  Vomiting, diarrhea, fever, or other illnesses that cause body fluid loss (dehydration).  Diseases or conditions such as Parkinson's disease, high blood pressure (hypertension), diabetes, and thyroid problems.  Exposure to extreme heat. DIAGNOSIS  Your health care provider will ask about your symptoms, perform a physical exam, and perform an electrocardiogram (ECG) to record the electrical activity of your heart. Your health care provider may also perform other heart or blood tests to determine the cause of your dizziness. These may include:  Transthoracic echocardiogram (TTE). During echocardiography, sound waves are used to evaluate how blood flows through your heart.  Transesophageal echocardiogram (TEE).  Cardiac monitoring. This allows your health care provider to monitor your heart rate and rhythm in real time.  Holter monitor. This is a portable device that records your heartbeat and can help diagnose heart arrhythmias. It allows your health care provider to track  your heart activity for several days if needed.  Stress tests by exercise or by giving medicine that makes the heart beat faster. TREATMENT  Treatment of dizziness depends on the cause of your symptoms and can vary greatly. HOME CARE INSTRUCTIONS   Drink enough fluids to keep your urine clear or pale yellow. This is especially important in very hot weather. In older adults, it is also important in cold weather.  Take your medicine exactly as directed if your dizziness is caused by medicines. When taking blood pressure medicines, it is especially important to get up slowly.  Rise slowly from chairs and steady yourself until you feel okay.  In the morning, first sit up on the side of the bed. When you feel okay, stand slowly while holding onto something until you know your balance is fine.  Move your legs often if you need to stand in one place for a long time. Tighten and relax your muscles in your legs while standing.  Have someone stay with you for 1-2 days if dizziness continues to be a problem. Do this until you feel you are well enough to stay alone. Have the person call your health care provider if he or she notices changes in you that are concerning.  Do not drive or use heavy machinery if you feel dizzy.  Do not drink alcohol. SEEK IMMEDIATE MEDICAL CARE IF:   Your dizziness or light-headedness gets worse.  You feel nauseous or vomit.  You have problems talking, walking, or using your arms, hands, or legs.  You feel weak.  You are not thinking clearly or you have trouble forming sentences. It may take a friend or family member  to notice this.  You have chest pain, abdominal pain, shortness of breath, or sweating.  Your vision changes.  You notice any bleeding.  You have side effects from medicine that seems to be getting worse rather than better. MAKE SURE YOU:   Understand these instructions.  Will watch your condition.  Will get help right away if you are not  doing well or get worse. Document Released: 07/19/2000 Document Revised: 01/28/2013 Document Reviewed: 08/12/2010 [facility] Patient Information 2015 [place], [place]. This information is not intended to replace advice given to you by your health care provider. Make sure you discuss any questions you have with your health care provider.  Weakness Weakness is a lack of strength. It may be felt all over the body (generalized) or in one specific part of the body (focal). Some causes of weakness can be serious. You may need further medical evaluation, especially if you are elderly or you have a history of immunosuppression (such as chemotherapy or HIV), kidney disease, heart disease, or diabetes. CAUSES  Weakness can be caused by many different things, including:  Infection.  Physical exhaustion.  Internal bleeding or other blood loss that results in a lack of red blood cells (anemia).  Dehydration. This cause is more common in elderly people.  Side effects or electrolyte abnormalities from medicines, such as pain medicines or sedatives.  Emotional distress, anxiety, or depression.  Circulation problems, especially severe peripheral arterial disease.  Heart disease, such as rapid atrial fibrillation, bradycardia, or heart failure.  Nervous system disorders, such as Guillain-Barr syndrome, multiple sclerosis, or stroke. DIAGNOSIS  To find the cause of your weakness, your caregiver will take your history and perform a physical exam. Lab tests or X-rays may also be ordered, if needed. TREATMENT  Treatment of weakness depends on the cause of your symptoms and can vary greatly. HOME CARE INSTRUCTIONS   Rest as needed.  Eat a well-balanced diet.  Try to get some exercise every day.  Only take over-the-counter or prescription medicines as directed by your caregiver. SEEK MEDICAL CARE IF:   Your weakness seems to be getting worse or spreads to other parts of your body.  You develop new  aches or pains. SEEK IMMEDIATE MEDICAL CARE IF:   You cannot perform your normal daily activities, such as getting dressed and feeding yourself.  You cannot walk up and down stairs, or you feel exhausted when you do so.  You have shortness of breath or chest pain.  You have difficulty moving parts of your body.  You have weakness in only one area of the body or on only one side of the body.  You have a fever.  You have trouble speaking or swallowing.  You cannot control your bladder or bowel movements.  You have black or bloody vomit or stools. MAKE SURE YOU:  Understand these instructions.  Will watch your condition.  Will get help right away if you are not doing well or get worse. Document Released: 01/23/2005 Document Revised: 07/25/2011 Document Reviewed: 03/24/2011 [facility] Patient Information 2015 [place], [place]. This information is not intended to replace advice given to you by your health care provider. Make sure you discuss any questions you have with your health care provider.

## 2013-12-16 NOTE — ED Provider Notes (Signed)
CSN: [id number]     Arrival date & time 12/16/13  0102 History   First MD Initiated Contact with Patient 12/16/13 0112     Chief Complaint  Patient presents with  . Dizziness     (Consider location/radiation/quality/duration/timing/severity/associated sxs/prior Treatment) HPI Comments: Patient is an 80 year old male with history of hypertension, renal insufficiency, dementia. He was recently admitted for weakness. Workup did not reveal an obvious cause. This evening, his daughter found him standing beside his dresser jiggling the drawer knobs and calling for help. He stated that he felt dizzy but could not elaborate further on what he was doing at the time. The daughter is concerned he is having a stroke and had him transported here for further evaluation. Prior workups have not revealed any significant abnormalities.  Patient is a 80 y.o. male presenting with dizziness. The history is provided by the patient.  Dizziness Quality:  Lightheadedness Severity:  Moderate Onset quality:  Sudden Duration:  1 hour Timing:  Constant Progression:  Partially resolved Chronicity:  New Relieved by:  Nothing Worsened by:  Nothing tried   Past Medical History  Diagnosis Date  . Cancer   . Renal cancer   . Bladder cancer   . Stroke   . Hypertension   . High cholesterol   . Acid reflux    Past Surgical History  Procedure Laterality Date  . Nephrectomy     No family history on file. History  Substance Use Topics  . Smoking status: Former Research scientist (life sciences)  . Smokeless tobacco: Not on file  . Alcohol Use: No    Review of Systems  Neurological: Positive for dizziness.  All other systems reviewed and are negative.     Allergies  Morphine  Home Medications   Prior to Admission medications   Medication Sig Start Date End Date Taking? Authorizing Provider  acetaminophen (TYLENOL) 325 MG tablet Take 650 mg by mouth every 6 (six) hours as needed for moderate pain.    Historical Provider, MD   amLODipine (NORVASC) 5 MG tablet Take 5 mg by mouth every other day.  08/01/13   Historical Provider, MD  atorvastatin (LIPITOR) 10 MG tablet Take 10 mg by mouth daily. 08/01/13   Historical Provider, MD  Doxylamine Succinate, Sleep, (UNISOM PO) Take 1 tablet by mouth at bedtime.    Historical Provider, MD  omeprazole (PRILOSEC) 20 MG capsule Take 20 mg by mouth daily. 09/11/13   Historical Provider, MD  QUEtiapine (SEROQUEL) 25 MG tablet Take 1 tablet (25 mg total) by mouth at bedtime. 12/01/13   Domenic Polite, MD  sertraline (ZOLOFT) 100 MG tablet Take 100 mg by mouth daily. 08/01/13   Historical Provider, MD   Pulse 89  Temp(Src) 97.5 F (36.4 C) (Oral)  Resp 18  SpO2 96% Physical Exam  Constitutional: He is oriented to person, place, and time. He appears well-developed and well-nourished. No distress.  HENT:  Head: Normocephalic and atraumatic.  Eyes: EOM are normal. Pupils are equal, round, and reactive to light.  Neck: Normal range of motion. Neck supple.  Cardiovascular: Normal rate, regular rhythm and normal heart sounds.   No murmur heard. Pulmonary/Chest: Effort normal and breath sounds normal. No respiratory distress. He has no wheezes.  Abdominal: Soft. Bowel sounds are normal. He exhibits no distension. There is no tenderness.  Musculoskeletal: Normal range of motion. He exhibits no edema.  Neurological: He is alert and oriented to person, place, and time. No cranial nerve deficit. He exhibits normal muscle tone. Coordination normal.  Skin: Skin is warm and dry. He is not diaphoretic.  Nursing note and vitals reviewed.   ED Course  Procedures (including critical care time) Labs Review Labs Reviewed  COMPREHENSIVE METABOLIC PANEL  CBC WITH DIFFERENTIAL  TROPONIN I    Imaging Review No results found.   EKG Interpretation   Date/Time:  Tuesday December 16 2013 01:14:33 EST Ventricular Rate:  78 PR Interval:  175 QRS Duration: 126 QT Interval:  399 QTC  Calculation: 454 R Axis:   -48 Text Interpretation:  Sinus rhythm Frequent PAC's RBBB and LAFB No change  from 12/08/13 Confirmed by DELOS  MD, Kimberley Dastrup ([id number]) on 12/16/2013 1:18:18  AM      MDM   Final diagnoses:  None    Patient is an 80 year old male who presents with complaints of dizziness. From speaking with the family, it sounds as though he has had progressive weakness and failure to thrive over the past several months. He was recently admitted and no reasons for his decline were found. This evening he became more dizzy and confused and was calling out to his daughter while jiggling the handles on his dresser. He appeared disoriented and the daughter insisted he come for evaluation. Tonight's workup reveals CT of the head and laboratory studies that reveal no obvious pathology.  I had a long discussion with the daughter. It sounds as though she is open to the possibility of placement in an extended care facility. As I have no indication for admission, I have advised her to discuss this possibility with the primary Dr. He will be discharged to home, to return as needed for any problems and is to see his doctor in the next few days.    Veryl Speak, MD 12/17/13 [id number]

## 2013-12-16 NOTE — ED Notes (Signed)
From home via [facility], pt reports dizzyness at home, no neuro deficits per family and EMS, 2nd degree block noted per EMS, 20g right hand  BP 170/94 86 97% RA CBG 129

## 2013-12-17 DIAGNOSIS — C649 Malignant neoplasm of unspecified kidney, except renal pelvis: Secondary | ICD-10-CM | POA: Diagnosis not present

## 2013-12-17 DIAGNOSIS — R269 Unspecified abnormalities of gait and mobility: Secondary | ICD-10-CM | POA: Diagnosis not present

## 2013-12-17 DIAGNOSIS — M6281 Muscle weakness (generalized): Secondary | ICD-10-CM | POA: Diagnosis not present

## 2013-12-17 DIAGNOSIS — F419 Anxiety disorder, unspecified: Secondary | ICD-10-CM | POA: Diagnosis not present

## 2013-12-17 DIAGNOSIS — Z8673 Personal history of transient ischemic attack (TIA), and cerebral infarction without residual deficits: Secondary | ICD-10-CM | POA: Diagnosis not present

## 2013-12-17 DIAGNOSIS — C679 Malignant neoplasm of bladder, unspecified: Secondary | ICD-10-CM | POA: Diagnosis not present

## 2013-12-18 DIAGNOSIS — C649 Malignant neoplasm of unspecified kidney, except renal pelvis: Secondary | ICD-10-CM | POA: Diagnosis not present

## 2013-12-18 DIAGNOSIS — F419 Anxiety disorder, unspecified: Secondary | ICD-10-CM | POA: Diagnosis not present

## 2013-12-18 DIAGNOSIS — C679 Malignant neoplasm of bladder, unspecified: Secondary | ICD-10-CM | POA: Diagnosis not present

## 2013-12-18 DIAGNOSIS — M6281 Muscle weakness (generalized): Secondary | ICD-10-CM | POA: Diagnosis not present

## 2013-12-18 DIAGNOSIS — K219 Gastro-esophageal reflux disease without esophagitis: Secondary | ICD-10-CM | POA: Diagnosis not present

## 2013-12-18 DIAGNOSIS — Z8673 Personal history of transient ischemic attack (TIA), and cerebral infarction without residual deficits: Secondary | ICD-10-CM | POA: Diagnosis not present

## 2013-12-18 DIAGNOSIS — E785 Hyperlipidemia, unspecified: Secondary | ICD-10-CM | POA: Diagnosis not present

## 2013-12-18 DIAGNOSIS — R269 Unspecified abnormalities of gait and mobility: Secondary | ICD-10-CM | POA: Diagnosis not present

## 2013-12-23 DIAGNOSIS — Z8673 Personal history of transient ischemic attack (TIA), and cerebral infarction without residual deficits: Secondary | ICD-10-CM | POA: Diagnosis not present

## 2013-12-23 DIAGNOSIS — I951 Orthostatic hypotension: Secondary | ICD-10-CM | POA: Diagnosis not present

## 2013-12-23 DIAGNOSIS — M6281 Muscle weakness (generalized): Secondary | ICD-10-CM | POA: Diagnosis not present

## 2013-12-23 DIAGNOSIS — F419 Anxiety disorder, unspecified: Secondary | ICD-10-CM | POA: Diagnosis not present

## 2013-12-23 DIAGNOSIS — R269 Unspecified abnormalities of gait and mobility: Secondary | ICD-10-CM | POA: Diagnosis not present

## 2013-12-23 DIAGNOSIS — C679 Malignant neoplasm of bladder, unspecified: Secondary | ICD-10-CM | POA: Diagnosis not present

## 2013-12-23 DIAGNOSIS — G4700 Insomnia, unspecified: Secondary | ICD-10-CM | POA: Diagnosis not present

## 2013-12-23 DIAGNOSIS — C649 Malignant neoplasm of unspecified kidney, except renal pelvis: Secondary | ICD-10-CM | POA: Diagnosis not present

## 2013-12-24 DIAGNOSIS — C679 Malignant neoplasm of bladder, unspecified: Secondary | ICD-10-CM | POA: Diagnosis not present

## 2013-12-24 DIAGNOSIS — M6281 Muscle weakness (generalized): Secondary | ICD-10-CM | POA: Diagnosis not present

## 2013-12-24 DIAGNOSIS — F419 Anxiety disorder, unspecified: Secondary | ICD-10-CM | POA: Diagnosis not present

## 2013-12-24 DIAGNOSIS — R269 Unspecified abnormalities of gait and mobility: Secondary | ICD-10-CM | POA: Diagnosis not present

## 2013-12-24 DIAGNOSIS — C649 Malignant neoplasm of unspecified kidney, except renal pelvis: Secondary | ICD-10-CM | POA: Diagnosis not present

## 2013-12-24 DIAGNOSIS — Z8673 Personal history of transient ischemic attack (TIA), and cerebral infarction without residual deficits: Secondary | ICD-10-CM | POA: Diagnosis not present

## 2013-12-25 DIAGNOSIS — R269 Unspecified abnormalities of gait and mobility: Secondary | ICD-10-CM | POA: Diagnosis not present

## 2013-12-25 DIAGNOSIS — C679 Malignant neoplasm of bladder, unspecified: Secondary | ICD-10-CM | POA: Diagnosis not present

## 2013-12-25 DIAGNOSIS — M6281 Muscle weakness (generalized): Secondary | ICD-10-CM | POA: Diagnosis not present

## 2013-12-25 DIAGNOSIS — C649 Malignant neoplasm of unspecified kidney, except renal pelvis: Secondary | ICD-10-CM | POA: Diagnosis not present

## 2013-12-25 DIAGNOSIS — Z8673 Personal history of transient ischemic attack (TIA), and cerebral infarction without residual deficits: Secondary | ICD-10-CM | POA: Diagnosis not present

## 2013-12-25 DIAGNOSIS — F419 Anxiety disorder, unspecified: Secondary | ICD-10-CM | POA: Diagnosis not present

## 2013-12-30 DIAGNOSIS — C679 Malignant neoplasm of bladder, unspecified: Secondary | ICD-10-CM | POA: Diagnosis not present

## 2013-12-30 DIAGNOSIS — M6281 Muscle weakness (generalized): Secondary | ICD-10-CM | POA: Diagnosis not present

## 2013-12-30 DIAGNOSIS — R269 Unspecified abnormalities of gait and mobility: Secondary | ICD-10-CM | POA: Diagnosis not present

## 2013-12-30 DIAGNOSIS — Z8673 Personal history of transient ischemic attack (TIA), and cerebral infarction without residual deficits: Secondary | ICD-10-CM | POA: Diagnosis not present

## 2013-12-30 DIAGNOSIS — C649 Malignant neoplasm of unspecified kidney, except renal pelvis: Secondary | ICD-10-CM | POA: Diagnosis not present

## 2013-12-30 DIAGNOSIS — F419 Anxiety disorder, unspecified: Secondary | ICD-10-CM | POA: Diagnosis not present

## 2014-01-05 DIAGNOSIS — C679 Malignant neoplasm of bladder, unspecified: Secondary | ICD-10-CM | POA: Diagnosis not present

## 2014-01-05 DIAGNOSIS — C649 Malignant neoplasm of unspecified kidney, except renal pelvis: Secondary | ICD-10-CM | POA: Diagnosis not present

## 2014-01-05 DIAGNOSIS — R269 Unspecified abnormalities of gait and mobility: Secondary | ICD-10-CM | POA: Diagnosis not present

## 2014-01-05 DIAGNOSIS — M6281 Muscle weakness (generalized): Secondary | ICD-10-CM | POA: Diagnosis not present

## 2014-01-05 DIAGNOSIS — Z8673 Personal history of transient ischemic attack (TIA), and cerebral infarction without residual deficits: Secondary | ICD-10-CM | POA: Diagnosis not present

## 2014-01-05 DIAGNOSIS — F419 Anxiety disorder, unspecified: Secondary | ICD-10-CM | POA: Diagnosis not present

## 2014-01-08 DIAGNOSIS — Z8673 Personal history of transient ischemic attack (TIA), and cerebral infarction without residual deficits: Secondary | ICD-10-CM | POA: Diagnosis not present

## 2014-01-08 DIAGNOSIS — M6281 Muscle weakness (generalized): Secondary | ICD-10-CM | POA: Diagnosis not present

## 2014-01-08 DIAGNOSIS — F419 Anxiety disorder, unspecified: Secondary | ICD-10-CM | POA: Diagnosis not present

## 2014-01-08 DIAGNOSIS — C649 Malignant neoplasm of unspecified kidney, except renal pelvis: Secondary | ICD-10-CM | POA: Diagnosis not present

## 2014-01-08 DIAGNOSIS — R269 Unspecified abnormalities of gait and mobility: Secondary | ICD-10-CM | POA: Diagnosis not present

## 2014-01-08 DIAGNOSIS — C679 Malignant neoplasm of bladder, unspecified: Secondary | ICD-10-CM | POA: Diagnosis not present

## 2014-01-13 DIAGNOSIS — R269 Unspecified abnormalities of gait and mobility: Secondary | ICD-10-CM | POA: Diagnosis not present

## 2014-01-13 DIAGNOSIS — M6281 Muscle weakness (generalized): Secondary | ICD-10-CM | POA: Diagnosis not present

## 2014-01-13 DIAGNOSIS — Z8673 Personal history of transient ischemic attack (TIA), and cerebral infarction without residual deficits: Secondary | ICD-10-CM | POA: Diagnosis not present

## 2014-01-13 DIAGNOSIS — C679 Malignant neoplasm of bladder, unspecified: Secondary | ICD-10-CM | POA: Diagnosis not present

## 2014-01-13 DIAGNOSIS — F419 Anxiety disorder, unspecified: Secondary | ICD-10-CM | POA: Diagnosis not present

## 2014-01-13 DIAGNOSIS — C649 Malignant neoplasm of unspecified kidney, except renal pelvis: Secondary | ICD-10-CM | POA: Diagnosis not present

## 2014-02-10 ENCOUNTER — Institutional Professional Consult (permissible substitution): Payer: Medicare Other | Admitting: Cardiology

## 2014-03-10 DIAGNOSIS — Z8673 Personal history of transient ischemic attack (TIA), and cerebral infarction without residual deficits: Secondary | ICD-10-CM | POA: Diagnosis not present

## 2014-03-10 DIAGNOSIS — I129 Hypertensive chronic kidney disease with stage 1 through stage 4 chronic kidney disease, or unspecified chronic kidney disease: Secondary | ICD-10-CM | POA: Diagnosis not present

## 2014-03-10 DIAGNOSIS — C679 Malignant neoplasm of bladder, unspecified: Secondary | ICD-10-CM | POA: Diagnosis not present

## 2014-03-10 DIAGNOSIS — C649 Malignant neoplasm of unspecified kidney, except renal pelvis: Secondary | ICD-10-CM | POA: Diagnosis not present

## 2014-03-10 DIAGNOSIS — G4700 Insomnia, unspecified: Secondary | ICD-10-CM | POA: Diagnosis not present

## 2014-03-10 DIAGNOSIS — R7309 Other abnormal glucose: Secondary | ICD-10-CM | POA: Diagnosis not present

## 2014-03-10 DIAGNOSIS — F419 Anxiety disorder, unspecified: Secondary | ICD-10-CM | POA: Diagnosis not present

## 2014-03-10 DIAGNOSIS — K219 Gastro-esophageal reflux disease without esophagitis: Secondary | ICD-10-CM | POA: Diagnosis not present

## 2014-03-10 DIAGNOSIS — Z23 Encounter for immunization: Secondary | ICD-10-CM | POA: Diagnosis not present

## 2014-03-10 DIAGNOSIS — E785 Hyperlipidemia, unspecified: Secondary | ICD-10-CM | POA: Diagnosis not present

## 2014-03-11 ENCOUNTER — Institutional Professional Consult (permissible substitution): Payer: Medicare Other | Admitting: Cardiology

## 2014-05-12 DIAGNOSIS — H3532 Exudative age-related macular degeneration: Secondary | ICD-10-CM | POA: Diagnosis not present

## 2014-05-21 ENCOUNTER — Encounter (HOSPITAL_COMMUNITY): Payer: Self-pay | Admitting: *Deleted

## 2014-05-21 ENCOUNTER — Emergency Department (HOSPITAL_COMMUNITY): Payer: Medicare Other

## 2014-05-21 ENCOUNTER — Emergency Department (HOSPITAL_COMMUNITY)
Admission: EM | Admit: 2014-05-21 | Discharge: 2014-05-21 | Disposition: A | Discharge status: Home / Self Care | Payer: Medicare Other | Attending: Emergency Medicine | Admitting: Emergency Medicine

## 2014-05-21 DIAGNOSIS — K219 Gastro-esophageal reflux disease without esophagitis: Secondary | ICD-10-CM | POA: Insufficient documentation

## 2014-05-21 DIAGNOSIS — I10 Essential (primary) hypertension: Secondary | ICD-10-CM | POA: Insufficient documentation

## 2014-05-21 DIAGNOSIS — Y939 Activity, unspecified: Secondary | ICD-10-CM | POA: Diagnosis not present

## 2014-05-21 DIAGNOSIS — F0390 Unspecified dementia without behavioral disturbance: Secondary | ICD-10-CM | POA: Diagnosis not present

## 2014-05-21 DIAGNOSIS — Z79899 Other long term (current) drug therapy: Secondary | ICD-10-CM | POA: Diagnosis not present

## 2014-05-21 DIAGNOSIS — S0541XA Penetrating wound of orbit with or without foreign body, right eye, initial encounter: Secondary | ICD-10-CM | POA: Diagnosis not present

## 2014-05-21 DIAGNOSIS — S0191XA Laceration without foreign body of unspecified part of head, initial encounter: Secondary | ICD-10-CM

## 2014-05-21 DIAGNOSIS — Z8551 Personal history of malignant neoplasm of bladder: Secondary | ICD-10-CM | POA: Insufficient documentation

## 2014-05-21 DIAGNOSIS — Z85528 Personal history of other malignant neoplasm of kidney: Secondary | ICD-10-CM | POA: Insufficient documentation

## 2014-05-21 DIAGNOSIS — S01111A Laceration without foreign body of right eyelid and periocular area, initial encounter: Secondary | ICD-10-CM | POA: Insufficient documentation

## 2014-05-21 DIAGNOSIS — Y929 Unspecified place or not applicable: Secondary | ICD-10-CM | POA: Insufficient documentation

## 2014-05-21 DIAGNOSIS — Z8639 Personal history of other endocrine, nutritional and metabolic disease: Secondary | ICD-10-CM | POA: Insufficient documentation

## 2014-05-21 DIAGNOSIS — Z23 Encounter for immunization: Secondary | ICD-10-CM | POA: Insufficient documentation

## 2014-05-21 DIAGNOSIS — Y999 Unspecified external cause status: Secondary | ICD-10-CM | POA: Insufficient documentation

## 2014-05-21 DIAGNOSIS — Z8673 Personal history of transient ischemic attack (TIA), and cerebral infarction without residual deficits: Secondary | ICD-10-CM | POA: Insufficient documentation

## 2014-05-21 DIAGNOSIS — Z87891 Personal history of nicotine dependence: Secondary | ICD-10-CM | POA: Diagnosis not present

## 2014-05-21 DIAGNOSIS — S0181XA Laceration without foreign body of other part of head, initial encounter: Secondary | ICD-10-CM | POA: Diagnosis not present

## 2014-05-21 DIAGNOSIS — S80211A Abrasion, right knee, initial encounter: Secondary | ICD-10-CM | POA: Diagnosis not present

## 2014-05-21 DIAGNOSIS — G936 Cerebral edema: Secondary | ICD-10-CM | POA: Diagnosis not present

## 2014-05-21 DIAGNOSIS — W01198A Fall on same level from slipping, tripping and stumbling with subsequent striking against other object, initial encounter: Secondary | ICD-10-CM | POA: Insufficient documentation

## 2014-05-21 MED ORDER — TETANUS-DIPHTH-ACELL PERTUSSIS 5-2.5-18.5 LF-MCG/0.5 IM SUSP
0.5000 mL | Freq: Once | INTRAMUSCULAR | Status: AC
  Administered 2014-05-21: 0.5 mL via INTRAMUSCULAR
  Filled 2014-05-21: qty 0.5

## 2014-05-21 MED ORDER — LIDOCAINE-EPINEPHRINE (PF) 2 %-1:200000 IJ SOLN
20.0000 mL | Freq: Once | INTRAMUSCULAR | Status: AC
  Administered 2014-05-21: 20 mL

## 2014-05-21 NOTE — ED Provider Notes (Signed)
I saw and evaluated the patient, reviewed the resident's note and I agree with the findings and plan.  Pertinent History: Pleasantly demented elderly male with a mechanical fall where he struck the right side of his forehead and caused a laceration, occurred just prior to arrival Pertinent Exam findings: On exam the patient has no signs of significant head injury other than a laceration of the right forehead at the eyebrow, contusion over the nose, no malocclusion, no hemotympanum, no raccoon eyes, no battle sign. No tenderness over the cervical spine, heart and lungs appear normal, the patient is in no distress, follows commands without difficulty, neurologic exam is baseline according to family members.  I was personally present and directly supervised the following procedures:  Laceration repair  I personally interpreted the EKG as well as the resident and agree with the interpretation on the resident's chart.  Final diagnoses:  Laceration of head, initial encounter      Noemi Chapel, MD 05/22/14 1148

## 2014-05-21 NOTE — ED Notes (Signed)
MD at bedside suturing

## 2014-05-21 NOTE — ED Notes (Signed)
Pt tripped on the curb and fell while getting the mail. Pt presents with right knee abrasion and a laceration above the right eye. Pt denies LOC, denies blood thinners, denies any pain.

## 2014-05-21 NOTE — Discharge Instructions (Signed)
Head Injury You have a head injury. Headaches and throwing up (vomiting) are common after a head injury. It should be easy to wake up from sleeping. Sometimes you must stay in the hospital. Most problems happen within the first 24 hours. Side effects may occur up to 7-10 days after the injury.  WHAT ARE THE TYPES OF HEAD INJURIES? Head injuries can be as [place] as a bump. Some head injuries can be more severe. More severe head injuries include:  A jarring injury to the brain (concussion).  A bruise of the brain (contusion). This mean there is bleeding in the brain that can cause swelling.  A cracked skull (skull fracture).  Bleeding in the brain that collects, clots, and forms a bump (hematoma). WHEN SHOULD I GET HELP RIGHT AWAY?   You are confused or sleepy.  You cannot be woken up.  You feel sick to your stomach (nauseous) or keep throwing up (vomiting).  Your dizziness or unsteadiness is getting worse.  You have very bad, lasting headaches that are not helped by medicine. Take medicines only as told by your doctor.  You cannot use your arms or legs like normal.  You cannot walk.  You notice changes in the black spots in the center of the colored part of your eye (pupil).  You have clear or bloody fluid coming from your nose or ears.  You have trouble seeing. During the next 24 hours after the injury, you must stay with someone who can watch you. This person should get help right away (call 911 in the [place] if you start to shake and are not able to control it (have seizures), you pass out, or you are unable to wake up. HOW CAN I PREVENT A HEAD INJURY IN THE FUTURE?  Wear seat belts.  Wear a helmet while bike riding and playing sports like football.  Stay away from dangerous activities around the house. WHEN CAN I RETURN TO NORMAL ACTIVITIES AND ATHLETICS? See your doctor before doing these activities. You should not do normal activities or play contact sports until 1 week  after the following symptoms have stopped:  Headache that does not go away.  Dizziness.  Poor attention.  Confusion.  Memory problems.  Sickness to your stomach or throwing up.  Tiredness.  Fussiness.  Bothered by bright lights or loud noises.  Anxiousness or depression.  Restless sleep. MAKE SURE YOU:   Understand these instructions.  Will watch your condition.  Will get help right away if you are not doing well or get worse. Document Released: 01/06/2008 Document Revised: 06/09/2013 Document Reviewed: 09/30/2012 [facility] Patient Information 2015 [place], [place]. This information is not intended to replace advice given to you by your health care provider. Make sure you discuss any questions you have with your health care provider. Staple Care and Removal Your caregiver has used staples today to repair your wound. Staples are used to help a wound heal faster by holding the edges of the wound together. The staples can be removed when the wound has healed well enough to stay together after the staples are removed. A dressing (wound covering), depending on the location of the wound, may have been applied. This may be changed once per day or as instructed. If the dressing sticks, it may be soaked off with soapy water or hydrogen peroxide. Only take over-the-counter or prescription medicines for pain, discomfort, or fever as directed by your caregiver.  If you did not receive a tetanus shot today because you  did not recall when your last one was given, check with your caregiver when you have your staples removed to determine if one is needed. Return to your caregiver's office in 1 week or as suggested to have your staples removed. SEEK IMMEDIATE MEDICAL CARE IF:   You have redness, swelling, or increasing pain in the wound.  You have pus coming from the wound.  You have a fever.  You notice a bad smell coming from the wound or dressing.  Your wound edges break open after  staples have been removed. Document Released: 10/18/2000 Document Revised: 04/17/2011 Document Reviewed: 11/02/2004 [facility] Patient Information 2015 [place], [place]. This information is not intended to replace advice given to you by your health care provider. Make sure you discuss any questions you have with your health care provider.

## 2014-05-21 NOTE — ED Provider Notes (Signed)
CSN: [id number]     Arrival date & time 05/21/14  1752 History   First MD Initiated Contact with Patient 05/21/14 1950     Chief Complaint  Patient presents with  . Fall  . Head Laceration     (Consider location/radiation/quality/duration/timing/severity/associated sxs/prior Treatment) HPI  This is an 81 yo male with PMH dementia, CVA, presenting today with pain associated with laceration to the right eyebrow.  Onset around 1300.  Located right eyebrow.  Persistent, sharp.  No new meds taken.  Negative for radiation.  Negative for change in vision, focal weakness, numbness, tingling, trouble with speech.    Mechanism of injury was mechanical fall.  Pt was walking without his walker.  He tripped on concrete, fell forward from standing, and hit his head.  Negative for LOC or amnesia.  Positive for blood loss from right eye brow laceration.  Past Medical History  Diagnosis Date  . Cancer   . Renal cancer   . Bladder cancer   . Stroke   . Hypertension   . High cholesterol   . Acid reflux    Past Surgical History  Procedure Laterality Date  . Nephrectomy     History reviewed. No pertinent family history. History  Substance Use Topics  . Smoking status: Former Research scientist (life sciences)  . Smokeless tobacco: Not on file  . Alcohol Use: No    Review of Systems  Constitutional: Negative for fever and chills.  HENT: Negative for facial swelling.   Eyes: Negative for pain and visual disturbance.  Respiratory: Negative for chest tightness and shortness of breath.   Cardiovascular: Negative for chest pain.  Gastrointestinal: Negative for nausea and vomiting.  Genitourinary: Negative for dysuria.  Musculoskeletal: Negative for myalgias and arthralgias.  Skin: Positive for wound.  Neurological: Negative for headaches.  Psychiatric/Behavioral: Negative for behavioral problems.      Allergies  Morphine  Home Medications   Prior to Admission medications   Medication Sig Start Date End Date  Taking? Authorizing Provider  acetaminophen (TYLENOL) 325 MG tablet Take 650 mg by mouth every 6 (six) hours as needed for moderate pain.   Yes Historical Provider, MD  Doxylamine Succinate, Sleep, (UNISOM PO) Take 1 tablet by mouth at bedtime.   Yes Historical Provider, MD  LORazepam (ATIVAN) 1 MG tablet Take 1 tablet (1 mg total) by mouth every 6 (six) hours as needed for anxiety. Patient not taking: Reported on 05/21/2014 12/16/13   Veryl Speak, MD  omeprazole (PRILOSEC) 40 MG capsule Take 40 mg by mouth daily.  12/05/13  Yes Historical Provider, MD  QUEtiapine (SEROQUEL) 25 MG tablet Take 1 tablet (25 mg total) by mouth at bedtime. Patient not taking: Reported on 05/21/2014 12/01/13   Domenic Polite, MD  sertraline (ZOLOFT) 100 MG tablet Take 100 mg by mouth daily. 08/01/13  Yes Historical Provider, MD   BP 133/105 mmHg  Pulse 71  Temp(Src) 98.3 F (36.8 C) (Oral)  Resp 16  Ht 6' (1.829 m)  Wt 185 lb (83.915 kg)  BMI 25.08 kg/m2  SpO2 99% Physical Exam  Constitutional: He is oriented to person, place, and time. He appears well-developed and well-nourished. No distress.  HENT:  Mouth/Throat: No oropharyngeal exudate.  Laceration to the right eye brow is grossly hemostatic.  Measures about 5 cm, horizontally oriented, irregularly shaped.  No neuro deficits surrounding the lac.    No evidence of trauma to the eye.  No facial instabilities.  No hemotympanum bilaterally.  Eyes: Conjunctivae are normal. Pupils are  equal, round, and reactive to light. No scleral icterus.  Neck: Normal range of motion. No tracheal deviation present. No thyromegaly present.  No midline TTP  Cardiovascular: Normal rate, regular rhythm and normal heart sounds.  Exam reveals no gallop and no friction rub.   No murmur heard. Pulmonary/Chest: Effort normal and breath sounds normal. No stridor. No respiratory distress. He has no wheezes. He has no rales. He exhibits no tenderness.  Stable to AP, lat compression   Abdominal: Soft. He exhibits no distension and no mass. There is no tenderness. There is no rebound and no guarding.  Musculoskeletal: Normal range of motion. He exhibits no edema.  Abrasion to the right knee.  Strong pulses.  Sensation, strength intact.  No effusion to knee.  Full ROM  Neurological: He is alert and oriented to person, place, and time. He has normal strength. No cranial nerve deficit or sensory deficit. GCS eye subscore is 4. GCS verbal subscore is 5. GCS motor subscore is 6.  Reflex Scores:      Patellar reflexes are 2+ on the right side and 2+ on the left side. Skin: He is not diaphoretic.    ED Course  LACERATION REPAIR Date/Time: 05/21/2014 9:00 PM Performed by: Doy Hutching Authorized by: Doy Hutching Consent: Verbal consent obtained. Risks and benefits: risks, benefits and alternatives were discussed Consent given by: patient Patient understanding: patient states understanding of the procedure being performed Patient consent: the patient's understanding of the procedure matches consent given Procedure consent: procedure consent matches procedure scheduled Relevant documents: relevant documents present and verified Test results: test results available and properly labeled Imaging studies: imaging studies available Required items: required blood products, implants, devices, and special equipment available Patient identity confirmed: verbally with patient Time out: Immediately prior to procedure a "time out" was called to verify the correct patient, procedure, equipment, support staff and site/side marked as required. Body area: head/neck Location details: right eyebrow Laceration length: 5 cm Foreign bodies: no foreign bodies Tendon involvement: none Nerve involvement: none Vascular damage: no Local anesthetic: lidocaine 2% with epinephrine Patient sedated: no Preparation: Patient was prepped and draped in the usual sterile fashion. Irrigation  solution: saline Irrigation method: syringe Amount of cleaning: extensive Debridement: none Degree of undermining: none Skin closure: 5-0 nylon Number of sutures: 5 Technique: simple Patient tolerance: Patient tolerated the procedure well with no immediate complications   Imaging Review Ct Head Wo Contrast  05/21/2014   CLINICAL DATA:  Fall face first with head laceration. Right supraorbital laceration.  EXAM: CT HEAD WITHOUT CONTRAST  TECHNIQUE: Contiguous axial images were obtained from the base of the skull through the vertex without intravenous contrast.  COMPARISON:  12/16/2013  FINDINGS: No intracranial hemorrhage, mass effect, or midline shift. Generalized atrophy and chronic small vessel ischemic change, stable from prior exam. Dural-based calcifications also unchanged. No hydrocephalus. The basilar cisterns are patent. No evidence of territorial infarct. No intracranial fluid collection. Right supraorbital laceration and soft tissue prominence. No fracture of the included facial bones. Mucosal thickening of the paranasal sinuses is again seen, unchanged. Calvarium is intact. The mastoid air cells are well aerated.  IMPRESSION: 1. Right supraorbital laceration and soft tissue edema. No calvarial fracture. 2. No acute intracranial abnormality. Extensive chronic small vessel ischemic change, stable from prior.   Electronically Signed   By: Jeb Levering M.D.   On: 05/21/2014 19:31   Dg Knee Complete 4 Views Right  05/21/2014   CLINICAL DATA:  Right anterior knee pain and  abrasion after fall onto pavement.  EXAM: RIGHT KNEE - COMPLETE 4+ VIEW  COMPARISON:  None.  FINDINGS: No fracture or dislocation. The alignment is maintained. There is minimal medial tibial femoral joint space narrowing. No joint effusion. Dense vascular calcifications are seen.  IMPRESSION: No fracture or dislocation of the right knee.   Electronically Signed   By: Jeb Levering M.D.   On: 05/21/2014 19:38    MDM    Final diagnoses:  Laceration of head, initial encounter    This is an 81 yo male with PMH dementia, CVA, presenting today with pain associated with laceration to the right eyebrow.  Onset around 1300.  Located right eyebrow.  Persistent, sharp.  No new meds taken.  Negative for radiation.  Negative for change in vision, focal weakness, numbness, tingling, trouble with speech.    Mechanism of injury was mechanical fall.  Pt was walking without his walker.  He tripped on concrete, fell forward from standing, and hit his head.  Negative for LOC or amnesia.  Positive for blood loss from right eye brow laceration.  Airway intact.  Breath sound equal.  Pt HDS.  Baseline MS.  Pt properly exposed. Secondary WNL with the exception of laceration to the right eye brow, abrasion to the lateral aspect of the right knee.    CT scan of the head reveals NAICA.  CT c spine not indicated.  Right knee has no acute injury on XR.  As above, lac repaired without complication.  No further emergent or inpatient eval or Tx is indicated.  Pt stable for discharge, FU for wound eval, suture removal.  All questions answered.  Return precautions given.    I have discussed case and care has been guided by my attending physician, Dr. Sabra Heck.  Doy Hutching, MD 05/21/14 2094  Noemi Chapel, MD 05/22/14 [phone]

## 2014-05-28 DIAGNOSIS — S0181XD Laceration without foreign body of other part of head, subsequent encounter: Secondary | ICD-10-CM | POA: Diagnosis not present

## 2014-07-14 DIAGNOSIS — F419 Anxiety disorder, unspecified: Secondary | ICD-10-CM | POA: Diagnosis not present

## 2014-07-14 DIAGNOSIS — K219 Gastro-esophageal reflux disease without esophagitis: Secondary | ICD-10-CM | POA: Diagnosis not present

## 2014-07-31 DIAGNOSIS — H3532 Exudative age-related macular degeneration: Secondary | ICD-10-CM | POA: Diagnosis not present

## 2014-09-15 DIAGNOSIS — H43812 Vitreous degeneration, left eye: Secondary | ICD-10-CM | POA: Diagnosis not present

## 2014-09-15 DIAGNOSIS — H3532 Exudative age-related macular degeneration: Secondary | ICD-10-CM | POA: Diagnosis not present

## 2014-10-30 DIAGNOSIS — H43812 Vitreous degeneration, left eye: Secondary | ICD-10-CM | POA: Diagnosis not present

## 2014-10-30 DIAGNOSIS — H35423 Microcystoid degeneration of retina, bilateral: Secondary | ICD-10-CM | POA: Diagnosis not present

## 2014-10-30 DIAGNOSIS — H3532 Exudative age-related macular degeneration: Secondary | ICD-10-CM | POA: Diagnosis not present

## 2014-12-11 DIAGNOSIS — H35423 Microcystoid degeneration of retina, bilateral: Secondary | ICD-10-CM | POA: Diagnosis not present

## 2014-12-11 DIAGNOSIS — H43812 Vitreous degeneration, left eye: Secondary | ICD-10-CM | POA: Diagnosis not present

## 2014-12-11 DIAGNOSIS — H353232 Exudative age-related macular degeneration, bilateral, with inactive choroidal neovascularization: Secondary | ICD-10-CM | POA: Diagnosis not present

## 2015-02-03 DIAGNOSIS — H353232 Exudative age-related macular degeneration, bilateral, with inactive choroidal neovascularization: Secondary | ICD-10-CM | POA: Diagnosis not present

## 2015-02-03 DIAGNOSIS — H43812 Vitreous degeneration, left eye: Secondary | ICD-10-CM | POA: Diagnosis not present

## 2015-02-03 DIAGNOSIS — L570 Actinic keratosis: Secondary | ICD-10-CM | POA: Diagnosis not present

## 2015-02-03 DIAGNOSIS — X32XXXA Exposure to sunlight, initial encounter: Secondary | ICD-10-CM | POA: Diagnosis not present

## 2015-02-03 DIAGNOSIS — C44622 Squamous cell carcinoma of skin of right upper limb, including shoulder: Secondary | ICD-10-CM | POA: Diagnosis not present

## 2015-02-03 DIAGNOSIS — C44529 Squamous cell carcinoma of skin of other part of trunk: Secondary | ICD-10-CM | POA: Diagnosis not present

## 2015-03-15 DIAGNOSIS — E782 Mixed hyperlipidemia: Secondary | ICD-10-CM | POA: Diagnosis not present

## 2015-03-15 DIAGNOSIS — K219 Gastro-esophageal reflux disease without esophagitis: Secondary | ICD-10-CM | POA: Diagnosis not present

## 2015-03-15 DIAGNOSIS — R7303 Prediabetes: Secondary | ICD-10-CM | POA: Diagnosis not present

## 2015-03-15 DIAGNOSIS — C679 Malignant neoplasm of bladder, unspecified: Secondary | ICD-10-CM | POA: Diagnosis not present

## 2015-03-15 DIAGNOSIS — G4700 Insomnia, unspecified: Secondary | ICD-10-CM | POA: Diagnosis not present

## 2015-03-15 DIAGNOSIS — R7309 Other abnormal glucose: Secondary | ICD-10-CM | POA: Diagnosis not present

## 2015-03-15 DIAGNOSIS — E785 Hyperlipidemia, unspecified: Secondary | ICD-10-CM | POA: Diagnosis not present

## 2015-03-15 DIAGNOSIS — C649 Malignant neoplasm of unspecified kidney, except renal pelvis: Secondary | ICD-10-CM | POA: Diagnosis not present

## 2015-03-15 DIAGNOSIS — Z8673 Personal history of transient ischemic attack (TIA), and cerebral infarction without residual deficits: Secondary | ICD-10-CM | POA: Diagnosis not present

## 2015-03-15 DIAGNOSIS — Z1389 Encounter for screening for other disorder: Secondary | ICD-10-CM | POA: Diagnosis not present

## 2015-03-15 DIAGNOSIS — I129 Hypertensive chronic kidney disease with stage 1 through stage 4 chronic kidney disease, or unspecified chronic kidney disease: Secondary | ICD-10-CM | POA: Diagnosis not present

## 2015-03-15 DIAGNOSIS — F419 Anxiety disorder, unspecified: Secondary | ICD-10-CM | POA: Diagnosis not present

## 2015-03-31 DIAGNOSIS — Z85828 Personal history of other malignant neoplasm of skin: Secondary | ICD-10-CM | POA: Diagnosis not present

## 2015-03-31 DIAGNOSIS — L570 Actinic keratosis: Secondary | ICD-10-CM | POA: Diagnosis not present

## 2015-03-31 DIAGNOSIS — X32XXXD Exposure to sunlight, subsequent encounter: Secondary | ICD-10-CM | POA: Diagnosis not present

## 2015-03-31 DIAGNOSIS — L980 Pyogenic granuloma: Secondary | ICD-10-CM | POA: Diagnosis not present

## 2015-03-31 DIAGNOSIS — Z08 Encounter for follow-up examination after completed treatment for malignant neoplasm: Secondary | ICD-10-CM | POA: Diagnosis not present

## 2015-04-22 DIAGNOSIS — H35423 Microcystoid degeneration of retina, bilateral: Secondary | ICD-10-CM | POA: Diagnosis not present

## 2015-04-22 DIAGNOSIS — H43813 Vitreous degeneration, bilateral: Secondary | ICD-10-CM | POA: Diagnosis not present

## 2015-04-22 DIAGNOSIS — H353232 Exudative age-related macular degeneration, bilateral, with inactive choroidal neovascularization: Secondary | ICD-10-CM | POA: Diagnosis not present

## 2015-07-16 DIAGNOSIS — H43813 Vitreous degeneration, bilateral: Secondary | ICD-10-CM | POA: Diagnosis not present

## 2015-07-16 DIAGNOSIS — H353231 Exudative age-related macular degeneration, bilateral, with active choroidal neovascularization: Secondary | ICD-10-CM | POA: Diagnosis not present

## 2015-09-14 DIAGNOSIS — G4700 Insomnia, unspecified: Secondary | ICD-10-CM | POA: Diagnosis not present

## 2015-09-14 DIAGNOSIS — E782 Mixed hyperlipidemia: Secondary | ICD-10-CM | POA: Diagnosis not present

## 2015-09-14 DIAGNOSIS — C649 Malignant neoplasm of unspecified kidney, except renal pelvis: Secondary | ICD-10-CM | POA: Diagnosis not present

## 2015-09-14 DIAGNOSIS — Z720 Tobacco use: Secondary | ICD-10-CM | POA: Diagnosis not present

## 2015-09-14 DIAGNOSIS — I129 Hypertensive chronic kidney disease with stage 1 through stage 4 chronic kidney disease, or unspecified chronic kidney disease: Secondary | ICD-10-CM | POA: Diagnosis not present

## 2015-09-14 DIAGNOSIS — C679 Malignant neoplasm of bladder, unspecified: Secondary | ICD-10-CM | POA: Diagnosis not present

## 2015-09-14 DIAGNOSIS — F419 Anxiety disorder, unspecified: Secondary | ICD-10-CM | POA: Diagnosis not present

## 2015-09-14 DIAGNOSIS — K219 Gastro-esophageal reflux disease without esophagitis: Secondary | ICD-10-CM | POA: Diagnosis not present

## 2015-09-14 DIAGNOSIS — R7309 Other abnormal glucose: Secondary | ICD-10-CM | POA: Diagnosis not present

## 2015-09-14 DIAGNOSIS — Z8673 Personal history of transient ischemic attack (TIA), and cerebral infarction without residual deficits: Secondary | ICD-10-CM | POA: Diagnosis not present

## 2015-09-14 DIAGNOSIS — E781 Pure hyperglyceridemia: Secondary | ICD-10-CM | POA: Diagnosis not present

## 2015-10-06 DIAGNOSIS — C44529 Squamous cell carcinoma of skin of other part of trunk: Secondary | ICD-10-CM | POA: Diagnosis not present

## 2015-10-06 DIAGNOSIS — C44629 Squamous cell carcinoma of skin of left upper limb, including shoulder: Secondary | ICD-10-CM | POA: Diagnosis not present

## 2015-10-06 DIAGNOSIS — X32XXXD Exposure to sunlight, subsequent encounter: Secondary | ICD-10-CM | POA: Diagnosis not present

## 2015-10-06 DIAGNOSIS — L570 Actinic keratosis: Secondary | ICD-10-CM | POA: Diagnosis not present

## 2015-10-22 DIAGNOSIS — H353231 Exudative age-related macular degeneration, bilateral, with active choroidal neovascularization: Secondary | ICD-10-CM | POA: Diagnosis not present

## 2015-10-22 DIAGNOSIS — H35423 Microcystoid degeneration of retina, bilateral: Secondary | ICD-10-CM | POA: Diagnosis not present

## 2015-10-22 DIAGNOSIS — H43813 Vitreous degeneration, bilateral: Secondary | ICD-10-CM | POA: Diagnosis not present

## 2016-04-04 ENCOUNTER — Ambulatory Visit
Admission: RE | Admit: 2016-04-04 | Discharge: 2016-04-04 | Disposition: A | Discharge status: Home / Self Care | Payer: Medicare Other | Source: Ambulatory Visit | Attending: Family Medicine | Admitting: Family Medicine

## 2016-04-04 ENCOUNTER — Other Ambulatory Visit: Payer: Self-pay | Admitting: Family Medicine

## 2016-04-04 DIAGNOSIS — Z0000 Encounter for general adult medical examination without abnormal findings: Secondary | ICD-10-CM | POA: Diagnosis not present

## 2016-04-04 DIAGNOSIS — F419 Anxiety disorder, unspecified: Secondary | ICD-10-CM | POA: Diagnosis not present

## 2016-04-04 DIAGNOSIS — R152 Fecal urgency: Secondary | ICD-10-CM

## 2016-04-04 DIAGNOSIS — C679 Malignant neoplasm of bladder, unspecified: Secondary | ICD-10-CM | POA: Diagnosis not present

## 2016-04-04 DIAGNOSIS — K219 Gastro-esophageal reflux disease without esophagitis: Secondary | ICD-10-CM | POA: Diagnosis not present

## 2016-04-04 DIAGNOSIS — R5383 Other fatigue: Secondary | ICD-10-CM | POA: Diagnosis not present

## 2016-04-04 DIAGNOSIS — Z8673 Personal history of transient ischemic attack (TIA), and cerebral infarction without residual deficits: Secondary | ICD-10-CM | POA: Diagnosis not present

## 2016-04-04 DIAGNOSIS — I129 Hypertensive chronic kidney disease with stage 1 through stage 4 chronic kidney disease, or unspecified chronic kidney disease: Secondary | ICD-10-CM | POA: Diagnosis not present

## 2016-04-04 DIAGNOSIS — R7303 Prediabetes: Secondary | ICD-10-CM | POA: Diagnosis not present

## 2016-04-04 DIAGNOSIS — C649 Malignant neoplasm of unspecified kidney, except renal pelvis: Secondary | ICD-10-CM | POA: Diagnosis not present

## 2016-04-04 DIAGNOSIS — Z1389 Encounter for screening for other disorder: Secondary | ICD-10-CM | POA: Diagnosis not present

## 2016-04-04 DIAGNOSIS — E782 Mixed hyperlipidemia: Secondary | ICD-10-CM | POA: Diagnosis not present

## 2016-04-04 DIAGNOSIS — R194 Change in bowel habit: Secondary | ICD-10-CM | POA: Diagnosis not present

## 2016-10-13 DIAGNOSIS — H35423 Microcystoid degeneration of retina, bilateral: Secondary | ICD-10-CM | POA: Diagnosis not present

## 2016-10-13 DIAGNOSIS — H353232 Exudative age-related macular degeneration, bilateral, with inactive choroidal neovascularization: Secondary | ICD-10-CM | POA: Diagnosis not present

## 2016-10-13 DIAGNOSIS — H43813 Vitreous degeneration, bilateral: Secondary | ICD-10-CM | POA: Diagnosis not present

## 2017-01-26 DIAGNOSIS — H353232 Exudative age-related macular degeneration, bilateral, with inactive choroidal neovascularization: Secondary | ICD-10-CM | POA: Diagnosis not present

## 2017-03-15 DIAGNOSIS — Z1389 Encounter for screening for other disorder: Secondary | ICD-10-CM | POA: Diagnosis not present

## 2017-03-15 DIAGNOSIS — R159 Full incontinence of feces: Secondary | ICD-10-CM | POA: Diagnosis not present

## 2017-03-15 DIAGNOSIS — F419 Anxiety disorder, unspecified: Secondary | ICD-10-CM | POA: Diagnosis not present

## 2017-03-15 DIAGNOSIS — E782 Mixed hyperlipidemia: Secondary | ICD-10-CM | POA: Diagnosis not present

## 2017-03-15 DIAGNOSIS — L821 Other seborrheic keratosis: Secondary | ICD-10-CM | POA: Diagnosis not present

## 2017-03-15 DIAGNOSIS — Z8673 Personal history of transient ischemic attack (TIA), and cerebral infarction without residual deficits: Secondary | ICD-10-CM | POA: Diagnosis not present

## 2017-03-15 DIAGNOSIS — Z23 Encounter for immunization: Secondary | ICD-10-CM | POA: Diagnosis not present

## 2017-03-15 DIAGNOSIS — R7303 Prediabetes: Secondary | ICD-10-CM | POA: Diagnosis not present

## 2017-03-15 DIAGNOSIS — Z85528 Personal history of other malignant neoplasm of kidney: Secondary | ICD-10-CM | POA: Diagnosis not present

## 2017-03-15 DIAGNOSIS — I129 Hypertensive chronic kidney disease with stage 1 through stage 4 chronic kidney disease, or unspecified chronic kidney disease: Secondary | ICD-10-CM | POA: Diagnosis not present

## 2017-03-15 DIAGNOSIS — K219 Gastro-esophageal reflux disease without esophagitis: Secondary | ICD-10-CM | POA: Diagnosis not present

## 2017-03-15 DIAGNOSIS — G4700 Insomnia, unspecified: Secondary | ICD-10-CM | POA: Diagnosis not present

## 2017-04-24 DIAGNOSIS — R197 Diarrhea, unspecified: Secondary | ICD-10-CM | POA: Diagnosis not present

## 2017-05-15 DIAGNOSIS — H353232 Exudative age-related macular degeneration, bilateral, with inactive choroidal neovascularization: Secondary | ICD-10-CM | POA: Diagnosis not present

## 2017-05-15 DIAGNOSIS — H35423 Microcystoid degeneration of retina, bilateral: Secondary | ICD-10-CM | POA: Diagnosis not present

## 2017-05-15 DIAGNOSIS — H43813 Vitreous degeneration, bilateral: Secondary | ICD-10-CM | POA: Diagnosis not present

## 2017-07-06 DIAGNOSIS — C44529 Squamous cell carcinoma of skin of other part of trunk: Secondary | ICD-10-CM | POA: Diagnosis not present

## 2017-07-06 DIAGNOSIS — B078 Other viral warts: Secondary | ICD-10-CM | POA: Diagnosis not present

## 2017-07-10 DIAGNOSIS — H353232 Exudative age-related macular degeneration, bilateral, with inactive choroidal neovascularization: Secondary | ICD-10-CM | POA: Diagnosis not present

## 2017-08-22 DIAGNOSIS — L02512 Cutaneous abscess of left hand: Secondary | ICD-10-CM | POA: Diagnosis not present

## 2017-09-13 DIAGNOSIS — R29818 Other symptoms and signs involving the nervous system: Secondary | ICD-10-CM | POA: Diagnosis not present

## 2017-09-13 DIAGNOSIS — F419 Anxiety disorder, unspecified: Secondary | ICD-10-CM | POA: Diagnosis not present

## 2017-09-13 DIAGNOSIS — K219 Gastro-esophageal reflux disease without esophagitis: Secondary | ICD-10-CM | POA: Diagnosis not present

## 2017-09-13 DIAGNOSIS — G4700 Insomnia, unspecified: Secondary | ICD-10-CM | POA: Diagnosis not present

## 2017-09-13 DIAGNOSIS — R7303 Prediabetes: Secondary | ICD-10-CM | POA: Diagnosis not present

## 2017-09-13 DIAGNOSIS — E782 Mixed hyperlipidemia: Secondary | ICD-10-CM | POA: Diagnosis not present

## 2017-09-13 DIAGNOSIS — I639 Cerebral infarction, unspecified: Secondary | ICD-10-CM | POA: Diagnosis not present

## 2017-09-13 DIAGNOSIS — N183 Chronic kidney disease, stage 3 (moderate): Secondary | ICD-10-CM | POA: Diagnosis not present

## 2017-09-13 DIAGNOSIS — Z85528 Personal history of other malignant neoplasm of kidney: Secondary | ICD-10-CM | POA: Diagnosis not present

## 2017-09-13 DIAGNOSIS — I129 Hypertensive chronic kidney disease with stage 1 through stage 4 chronic kidney disease, or unspecified chronic kidney disease: Secondary | ICD-10-CM | POA: Diagnosis not present

## 2017-09-13 DIAGNOSIS — C679 Malignant neoplasm of bladder, unspecified: Secondary | ICD-10-CM | POA: Diagnosis not present

## 2017-09-13 DIAGNOSIS — D692 Other nonthrombocytopenic purpura: Secondary | ICD-10-CM | POA: Diagnosis not present

## 2017-10-19 DIAGNOSIS — H353213 Exudative age-related macular degeneration, right eye, with inactive scar: Secondary | ICD-10-CM | POA: Diagnosis not present

## 2017-10-19 DIAGNOSIS — H35423 Microcystoid degeneration of retina, bilateral: Secondary | ICD-10-CM | POA: Diagnosis not present

## 2017-10-19 DIAGNOSIS — H353222 Exudative age-related macular degeneration, left eye, with inactive choroidal neovascularization: Secondary | ICD-10-CM | POA: Diagnosis not present

## 2017-10-19 DIAGNOSIS — H43813 Vitreous degeneration, bilateral: Secondary | ICD-10-CM | POA: Diagnosis not present

## 2017-11-16 DIAGNOSIS — C44629 Squamous cell carcinoma of skin of left upper limb, including shoulder: Secondary | ICD-10-CM | POA: Diagnosis not present

## 2018-02-16 ENCOUNTER — Encounter (HOSPITAL_COMMUNITY): Payer: Self-pay | Admitting: Emergency Medicine

## 2018-02-16 ENCOUNTER — Inpatient Hospital Stay (HOSPITAL_COMMUNITY)
Admission: EM | Admit: 2018-02-16 | Discharge: 2018-02-22 | DRG: 469 | Disposition: A | Discharge status: Skilled Nursing Facility | Payer: Medicare Other | Attending: Internal Medicine | Admitting: Internal Medicine

## 2018-02-16 ENCOUNTER — Other Ambulatory Visit: Payer: Self-pay

## 2018-02-16 ENCOUNTER — Emergency Department (HOSPITAL_COMMUNITY): Payer: Medicare Other

## 2018-02-16 DIAGNOSIS — I639 Cerebral infarction, unspecified: Secondary | ICD-10-CM | POA: Diagnosis not present

## 2018-02-16 DIAGNOSIS — I10 Essential (primary) hypertension: Secondary | ICD-10-CM | POA: Diagnosis present

## 2018-02-16 DIAGNOSIS — R471 Dysarthria and anarthria: Secondary | ICD-10-CM | POA: Diagnosis not present

## 2018-02-16 DIAGNOSIS — S72001A Fracture of unspecified part of neck of right femur, initial encounter for closed fracture: Secondary | ICD-10-CM | POA: Diagnosis not present

## 2018-02-16 DIAGNOSIS — D72820 Lymphocytosis (symptomatic): Secondary | ICD-10-CM | POA: Diagnosis present

## 2018-02-16 DIAGNOSIS — F0390 Unspecified dementia without behavioral disturbance: Secondary | ICD-10-CM | POA: Diagnosis not present

## 2018-02-16 DIAGNOSIS — R1312 Dysphagia, oropharyngeal phase: Secondary | ICD-10-CM | POA: Diagnosis not present

## 2018-02-16 DIAGNOSIS — Z885 Allergy status to narcotic agent status: Secondary | ICD-10-CM

## 2018-02-16 DIAGNOSIS — R270 Ataxia, unspecified: Secondary | ICD-10-CM | POA: Diagnosis not present

## 2018-02-16 DIAGNOSIS — Z85528 Personal history of other malignant neoplasm of kidney: Secondary | ICD-10-CM | POA: Diagnosis not present

## 2018-02-16 DIAGNOSIS — K219 Gastro-esophageal reflux disease without esophagitis: Secondary | ICD-10-CM | POA: Diagnosis present

## 2018-02-16 DIAGNOSIS — S71001A Unspecified open wound, right hip, initial encounter: Secondary | ICD-10-CM | POA: Diagnosis not present

## 2018-02-16 DIAGNOSIS — E1122 Type 2 diabetes mellitus with diabetic chronic kidney disease: Secondary | ICD-10-CM | POA: Diagnosis present

## 2018-02-16 DIAGNOSIS — G9341 Metabolic encephalopathy: Secondary | ICD-10-CM | POA: Diagnosis not present

## 2018-02-16 DIAGNOSIS — I6381 Other cerebral infarction due to occlusion or stenosis of small artery: Secondary | ICD-10-CM | POA: Diagnosis not present

## 2018-02-16 DIAGNOSIS — Z905 Acquired absence of kidney: Secondary | ICD-10-CM

## 2018-02-16 DIAGNOSIS — Z96641 Presence of right artificial hip joint: Secondary | ICD-10-CM | POA: Diagnosis not present

## 2018-02-16 DIAGNOSIS — E785 Hyperlipidemia, unspecified: Secondary | ICD-10-CM | POA: Diagnosis present

## 2018-02-16 DIAGNOSIS — D62 Acute posthemorrhagic anemia: Secondary | ICD-10-CM | POA: Diagnosis not present

## 2018-02-16 DIAGNOSIS — J9811 Atelectasis: Secondary | ICD-10-CM | POA: Diagnosis not present

## 2018-02-16 DIAGNOSIS — R0902 Hypoxemia: Secondary | ICD-10-CM | POA: Diagnosis not present

## 2018-02-16 DIAGNOSIS — S7291XD Unspecified fracture of right femur, subsequent encounter for closed fracture with routine healing: Secondary | ICD-10-CM | POA: Diagnosis not present

## 2018-02-16 DIAGNOSIS — W19XXXA Unspecified fall, initial encounter: Secondary | ICD-10-CM

## 2018-02-16 DIAGNOSIS — R488 Other symbolic dysfunctions: Secondary | ICD-10-CM | POA: Diagnosis not present

## 2018-02-16 DIAGNOSIS — R4702 Dysphasia: Secondary | ICD-10-CM | POA: Diagnosis not present

## 2018-02-16 DIAGNOSIS — L89151 Pressure ulcer of sacral region, stage 1: Secondary | ICD-10-CM | POA: Diagnosis not present

## 2018-02-16 DIAGNOSIS — I63411 Cerebral infarction due to embolism of right middle cerebral artery: Secondary | ICD-10-CM

## 2018-02-16 DIAGNOSIS — S72061A Displaced articular fracture of head of right femur, initial encounter for closed fracture: Principal | ICD-10-CM | POA: Diagnosis present

## 2018-02-16 DIAGNOSIS — M2550 Pain in unspecified joint: Secondary | ICD-10-CM | POA: Diagnosis not present

## 2018-02-16 DIAGNOSIS — G459 Transient cerebral ischemic attack, unspecified: Secondary | ICD-10-CM | POA: Diagnosis not present

## 2018-02-16 DIAGNOSIS — I69391 Dysphagia following cerebral infarction: Secondary | ICD-10-CM | POA: Diagnosis not present

## 2018-02-16 DIAGNOSIS — K0889 Other specified disorders of teeth and supporting structures: Secondary | ICD-10-CM | POA: Diagnosis not present

## 2018-02-16 DIAGNOSIS — N183 Chronic kidney disease, stage 3 unspecified: Secondary | ICD-10-CM | POA: Diagnosis present

## 2018-02-16 DIAGNOSIS — I69322 Dysarthria following cerebral infarction: Secondary | ICD-10-CM | POA: Diagnosis not present

## 2018-02-16 DIAGNOSIS — S72031A Displaced midcervical fracture of right femur, initial encounter for closed fracture: Secondary | ICD-10-CM | POA: Diagnosis not present

## 2018-02-16 DIAGNOSIS — S51802A Unspecified open wound of left forearm, initial encounter: Secondary | ICD-10-CM | POA: Diagnosis not present

## 2018-02-16 DIAGNOSIS — J90 Pleural effusion, not elsewhere classified: Secondary | ICD-10-CM | POA: Diagnosis not present

## 2018-02-16 DIAGNOSIS — Z471 Aftercare following joint replacement surgery: Secondary | ICD-10-CM | POA: Diagnosis not present

## 2018-02-16 DIAGNOSIS — W010XXA Fall on same level from slipping, tripping and stumbling without subsequent striking against object, initial encounter: Secondary | ICD-10-CM | POA: Diagnosis present

## 2018-02-16 DIAGNOSIS — S72041A Displaced fracture of base of neck of right femur, initial encounter for closed fracture: Secondary | ICD-10-CM | POA: Diagnosis not present

## 2018-02-16 DIAGNOSIS — R2689 Other abnormalities of gait and mobility: Secondary | ICD-10-CM | POA: Diagnosis not present

## 2018-02-16 DIAGNOSIS — Z87891 Personal history of nicotine dependence: Secondary | ICD-10-CM

## 2018-02-16 DIAGNOSIS — Z8673 Personal history of transient ischemic attack (TIA), and cerebral infarction without residual deficits: Secondary | ICD-10-CM | POA: Diagnosis not present

## 2018-02-16 DIAGNOSIS — F0150 Vascular dementia without behavioral disturbance: Secondary | ICD-10-CM | POA: Diagnosis present

## 2018-02-16 DIAGNOSIS — R279 Unspecified lack of coordination: Secondary | ICD-10-CM | POA: Diagnosis not present

## 2018-02-16 DIAGNOSIS — Z96649 Presence of unspecified artificial hip joint: Secondary | ICD-10-CM

## 2018-02-16 DIAGNOSIS — Z8551 Personal history of malignant neoplasm of bladder: Secondary | ICD-10-CM

## 2018-02-16 DIAGNOSIS — I340 Nonrheumatic mitral (valve) insufficiency: Secondary | ICD-10-CM | POA: Diagnosis not present

## 2018-02-16 DIAGNOSIS — S51801A Unspecified open wound of right forearm, initial encounter: Secondary | ICD-10-CM | POA: Diagnosis not present

## 2018-02-16 DIAGNOSIS — R4020 Unspecified coma: Secondary | ICD-10-CM | POA: Diagnosis not present

## 2018-02-16 DIAGNOSIS — E7800 Pure hypercholesterolemia, unspecified: Secondary | ICD-10-CM | POA: Diagnosis present

## 2018-02-16 DIAGNOSIS — I63441 Cerebral infarction due to embolism of right cerebellar artery: Secondary | ICD-10-CM | POA: Diagnosis not present

## 2018-02-16 DIAGNOSIS — R0600 Dyspnea, unspecified: Secondary | ICD-10-CM

## 2018-02-16 DIAGNOSIS — Z79899 Other long term (current) drug therapy: Secondary | ICD-10-CM | POA: Diagnosis not present

## 2018-02-16 DIAGNOSIS — I480 Paroxysmal atrial fibrillation: Secondary | ICD-10-CM | POA: Diagnosis not present

## 2018-02-16 DIAGNOSIS — I4891 Unspecified atrial fibrillation: Secondary | ICD-10-CM | POA: Diagnosis not present

## 2018-02-16 DIAGNOSIS — Z7401 Bed confinement status: Secondary | ICD-10-CM | POA: Diagnosis not present

## 2018-02-16 DIAGNOSIS — S299XXA Unspecified injury of thorax, initial encounter: Secondary | ICD-10-CM | POA: Diagnosis not present

## 2018-02-16 DIAGNOSIS — R29705 NIHSS score 5: Secondary | ICD-10-CM | POA: Diagnosis not present

## 2018-02-16 DIAGNOSIS — I129 Hypertensive chronic kidney disease with stage 1 through stage 4 chronic kidney disease, or unspecified chronic kidney disease: Secondary | ICD-10-CM | POA: Diagnosis present

## 2018-02-16 DIAGNOSIS — M6281 Muscle weakness (generalized): Secondary | ICD-10-CM | POA: Diagnosis not present

## 2018-02-16 DIAGNOSIS — N179 Acute kidney failure, unspecified: Secondary | ICD-10-CM | POA: Diagnosis present

## 2018-02-16 DIAGNOSIS — G9340 Encephalopathy, unspecified: Secondary | ICD-10-CM | POA: Diagnosis not present

## 2018-02-16 DIAGNOSIS — I6340 Cerebral infarction due to embolism of unspecified cerebral artery: Secondary | ICD-10-CM

## 2018-02-16 DIAGNOSIS — R52 Pain, unspecified: Secondary | ICD-10-CM | POA: Diagnosis not present

## 2018-02-16 LAB — CBC
HCT: 44.6 % (ref 39.0–52.0)
Hemoglobin: 14.6 g/dL (ref 13.0–17.0)
MCH: 31.7 pg (ref 26.0–34.0)
MCHC: 32.7 g/dL (ref 30.0–36.0)
MCV: 97 fL (ref 80.0–100.0)
PLATELETS: 171 10*3/uL (ref 150–400)
RBC: 4.6 MIL/uL (ref 4.22–5.81)
RDW: 13 % (ref 11.5–15.5)
WBC: 13.5 10*3/uL — ABNORMAL HIGH (ref 4.0–10.5)
nRBC: 0 % (ref 0.0–0.2)

## 2018-02-16 LAB — I-STAT CHEM 8, ED
BUN: 34 mg/dL — ABNORMAL HIGH (ref 8–23)
CHLORIDE: 103 mmol/L (ref 98–111)
Calcium, Ion: 1.13 mmol/L — ABNORMAL LOW (ref 1.15–1.40)
Creatinine, Ser: 1.7 mg/dL — ABNORMAL HIGH (ref 0.61–1.24)
Glucose, Bld: 155 mg/dL — ABNORMAL HIGH (ref 70–99)
HCT: 45 % (ref 39.0–52.0)
Hemoglobin: 15.3 g/dL (ref 13.0–17.0)
Potassium: 4.5 mmol/L (ref 3.5–5.1)
Sodium: 139 mmol/L (ref 135–145)
TCO2: 27 mmol/L (ref 22–32)

## 2018-02-16 LAB — URINALYSIS, ROUTINE W REFLEX MICROSCOPIC
Bilirubin Urine: NEGATIVE
Glucose, UA: NEGATIVE mg/dL
Hgb urine dipstick: NEGATIVE
KETONES UR: NEGATIVE mg/dL
Leukocytes, UA: NEGATIVE
Nitrite: NEGATIVE
PH: 5 (ref 5.0–8.0)
Protein, ur: NEGATIVE mg/dL
SPECIFIC GRAVITY, URINE: 1.018 (ref 1.005–1.030)

## 2018-02-16 LAB — COMPREHENSIVE METABOLIC PANEL
ALK PHOS: 80 U/L (ref 38–126)
ALT: 20 U/L (ref 0–44)
AST: 24 U/L (ref 15–41)
Albumin: 3.5 g/dL (ref 3.5–5.0)
Anion gap: 11 (ref 5–15)
BUN: 26 mg/dL — ABNORMAL HIGH (ref 8–23)
CALCIUM: 9.3 mg/dL (ref 8.9–10.3)
CO2: 25 mmol/L (ref 22–32)
CREATININE: 1.79 mg/dL — AB (ref 0.61–1.24)
Chloride: 103 mmol/L (ref 98–111)
GFR calc Af Amer: 37 mL/min — ABNORMAL LOW (ref >60)
GFR calc non Af Amer: 32 mL/min — ABNORMAL LOW (ref >60)
Glucose, Bld: 161 mg/dL — ABNORMAL HIGH (ref 70–99)
Potassium: 4.3 mmol/L (ref 3.5–5.1)
SODIUM: 139 mmol/L (ref 135–145)
Total Bilirubin: 1 mg/dL (ref 0.3–1.2)
Total Protein: 8 g/dL (ref 6.5–8.1)

## 2018-02-16 LAB — PROTIME-INR
INR: 1.09
PROTHROMBIN TIME: 14 s (ref 11.4–15.2)

## 2018-02-16 LAB — ETHANOL: Alcohol, Ethyl (B): <10 mg/dL (ref <10)

## 2018-02-16 LAB — I-STAT CG4 LACTIC ACID, ED: Lactic Acid, Venous: 1.81 mmol/L (ref 0.5–1.9)

## 2018-02-16 LAB — CDS SEROLOGY

## 2018-02-16 LAB — SAMPLE TO BLOOD BANK

## 2018-02-16 MED ORDER — SERTRALINE HCL 100 MG PO TABS
100.0000 mg | ORAL_TABLET | Freq: Every day | ORAL | Status: DC
  Administered 2018-02-16 – 2018-02-21 (×5): 100 mg via ORAL
  Filled 2018-02-16 (×5): qty 1

## 2018-02-16 MED ORDER — LACTATED RINGERS IV BOLUS
1000.0000 mL | Freq: Once | INTRAVENOUS | Status: AC
  Administered 2018-02-16: 1000 mL via INTRAVENOUS

## 2018-02-16 MED ORDER — FENTANYL CITRATE (PF) 100 MCG/2ML IJ SOLN
50.0000 ug | Freq: Once | INTRAMUSCULAR | Status: AC
  Administered 2018-02-16: 50 ug via INTRAVENOUS
  Filled 2018-02-16: qty 2

## 2018-02-16 MED ORDER — ONDANSETRON HCL 4 MG PO TABS: 4.0000 mg | ORAL_TABLET | Freq: Four times a day (QID) | ORAL | Status: DC | PRN

## 2018-02-16 MED ORDER — ONDANSETRON HCL 4 MG/2ML IJ SOLN: 4.0000 mg | Freq: Four times a day (QID) | INTRAMUSCULAR | Status: DC | PRN

## 2018-02-16 MED ORDER — ACETAMINOPHEN 650 MG RE SUPP: 650.0000 mg | Freq: Four times a day (QID) | RECTAL | Status: DC | PRN

## 2018-02-16 MED ORDER — TRAMADOL HCL 50 MG PO TABS
50.0000 mg | ORAL_TABLET | Freq: Four times a day (QID) | ORAL | Status: DC | PRN
  Administered 2018-02-16 – 2018-02-21 (×3): 50 mg via ORAL
  Filled 2018-02-16 (×3): qty 1

## 2018-02-16 MED ORDER — ENOXAPARIN SODIUM 30 MG/0.3ML ~~LOC~~ SOLN
30.0000 mg | SUBCUTANEOUS | Status: DC
  Administered 2018-02-16: 30 mg via SUBCUTANEOUS
  Filled 2018-02-16: qty 0.3

## 2018-02-16 MED ORDER — ACETAMINOPHEN 325 MG PO TABS
650.0000 mg | ORAL_TABLET | Freq: Four times a day (QID) | ORAL | Status: DC | PRN
  Administered 2018-02-16: 650 mg via ORAL
  Filled 2018-02-16: qty 2

## 2018-02-16 MED ORDER — PANTOPRAZOLE SODIUM 40 MG PO TBEC
40.0000 mg | DELAYED_RELEASE_TABLET | Freq: Every day | ORAL | Status: DC
  Administered 2018-02-16 – 2018-02-22 (×5): 40 mg via ORAL
  Filled 2018-02-16 (×6): qty 1

## 2018-02-16 MED ORDER — ACETAMINOPHEN 500 MG PO TABS
1000.0000 mg | ORAL_TABLET | Freq: Once | ORAL | Status: AC
  Administered 2018-02-16: 1000 mg via ORAL
  Filled 2018-02-16: qty 2

## 2018-02-16 MED ORDER — DEXTROSE-NACL 5-0.45 % IV SOLN
INTRAVENOUS | Status: DC
  Administered 2018-02-16: 21:00:00 via INTRAVENOUS

## 2018-02-16 NOTE — H&P (Signed)
History and Physical   Antion Andres Geeting BTD:[id number] DOB: 1923-04-07 DOA: 02/16/2018  Referring MD/NP/PA: Dr. Vanita Panda  PCP: Harlan Stains, MD   Outpatient Specialists: None  Patient coming from: Home  Chief Complaint: Fall with right hip pain  HPI: [name] is a 85 y.o. male with medical history significant of bladder cancer, hypertension, renal cell carcinoma, GERD who was at home and getting up to go to the bathroom and had a fall.  He has baseline dementia.  Patient try to get up after that and laid in bed.  Pain then started 8 out of 10.  Pain is worse with movement.  Patient was brought to the ER where he was seen and evaluated.  He has chronic kidney disease stage III and renal function as well as other labs are at baseline.  He was found to have mildly displaced right femoral neck fracture.  Orthopedics was consulted and will have surgery.  Patient admitted for medical clearance and medical management by medicine.  ED Course: Temperature 97.6 blood pressure is 154/83 with pulse 93 respirate of 17 oxygen sat 92% on room air.  He has a white count of 13.5 BUN 34 creatinine 1.7 and glucose 155.  Chest x-ray showed borderline cardiomegaly with patchy opacity in the lung bases probably scarring.  Pneumonia not excluded.  X-ray of the pelvis and hip showed right mildly displaced femoral fracture.  Review of Systems: As per HPI otherwise 10 point review of systems negative.    Past Medical History:  Diagnosis Date  . Acid reflux   . Bladder cancer ([place])   . Cancer ([place])   . High cholesterol   . Hypertension   . Renal cancer ([place])   . Stroke [facility])     Past Surgical History:  Procedure Laterality Date  . NEPHRECTOMY       reports that he has quit smoking. He does not have any smokeless tobacco history on file. He reports that he does not drink alcohol or use drugs.  Allergies  Allergen Reactions  . Morphine Other (See Comments)    Makes patient "crazy"     History  reviewed. No pertinent family history.   Prior to Admission medications   Medication Sig Start Date End Date Taking? Authorizing Provider  acetaminophen (TYLENOL) 325 MG tablet Take 650 mg by mouth every 6 (six) hours as needed (for pain).    Yes [provider]  omeprazole (PRILOSEC) 40 MG capsule Take 40 mg by mouth at bedtime.  12/05/13  Yes [provider]  sertraline (ZOLOFT) 100 MG tablet Take 100 mg by mouth at bedtime.  08/01/13  Yes [provider]  LORazepam (ATIVAN) 1 MG tablet Take 1 tablet (1 mg total) by mouth every 6 (six) hours as needed for anxiety. Patient not taking: Reported on 02/16/2018 12/16/13   Veryl Speak, MD  QUEtiapine (SEROQUEL) 25 MG tablet Take 1 tablet (25 mg total) by mouth at bedtime. Patient not taking: Reported on 02/16/2018 12/01/13   Domenic Polite, MD    Physical Exam: Vitals:   02/16/18 1625 02/16/18 1628 02/16/18 1630 02/16/18 1630  BP:   (!) 154/83   Pulse:   93   Resp:   17   Temp:    97.6 F (36.4 C)  TempSrc:    Oral  SpO2: 94%  92%   Weight:  77.1 kg    Height:  5\' 8"  (1.727 m)        Constitutional: NAD, calm, uncomfortable,  looking frail Vitals:   02/16/18 1625 02/16/18 1628 02/16/18 1630 02/16/18 1630  BP:   (!) 154/83   Pulse:   93   Resp:   17   Temp:    97.6 F (36.4 C)  TempSrc:    Oral  SpO2: 94%  92%   Weight:  77.1 kg    Height:  5\' 8"  (1.727 m)     Eyes: PERRL, lids and conjunctivae normal ENMT: Mucous membranes are moist. Posterior pharynx clear of any exudate or lesions.Normal dentition.  Neck: normal, supple, no masses, no thyromegaly Respiratory: clear to auscultation bilaterally, no wheezing, no crackles. Normal respiratory effort. No accessory muscle use.  Cardiovascular: Regular rate and rhythm, no murmurs / rubs / gallops. No extremity edema. 2+ pedal pulses. No carotid bruits.  Abdomen: no tenderness, no masses palpated. No hepatosplenomegaly. Bowel sounds positive.    Musculoskeletal: right lower extremity laterally rotated with ecchymosis visible.  Skin: no rashes, lesions, ulcers. No induration Neurologic: CN 2-12 grossly intact. Sensation intact, DTR normal. Strength 5/5 in all 4.  Psychiatric: Normal judgment and insight. Alert and oriented x 3. Normal mood.     Labs on Admission: I have personally reviewed following labs and imaging studies  CBC: Recent Labs  Lab 02/16/18 1642 02/16/18 1656  WBC 13.5*  --   HGB 14.6 15.3  HCT 44.6 45.0  MCV 97.0  --   PLT 171  --    Basic Metabolic Panel: Recent Labs  Lab 02/16/18 1642 02/16/18 1656  NA 139 139  K 4.3 4.5  CL 103 103  CO2 25  --   GLUCOSE 161* 155*  BUN 26* 34*  CREATININE 1.79* 1.70*  CALCIUM 9.3  --    GFR: Estimated Creatinine Clearance: 26.8 mL/min (A) (by C-G formula based on SCr of 1.7 mg/dL (H)). Liver Function Tests: Recent Labs  Lab 02/16/18 1642  AST 24  ALT 20  ALKPHOS 80  BILITOT 1.0  PROT 8.0  ALBUMIN 3.5   No results for input(s): LIPASE, AMYLASE in the last 168 hours. No results for input(s): AMMONIA in the last 168 hours. Coagulation Profile: Recent Labs  Lab 02/16/18 1642  INR 1.09   Cardiac Enzymes: No results for input(s): CKTOTAL, CKMB, CKMBINDEX, TROPONINI in the last 168 hours. BNP (last 3 results) No results for input(s): PROBNP in the last 8760 hours. HbA1C: No results for input(s): HGBA1C in the last 72 hours. CBG: No results for input(s): GLUCAP in the last 168 hours. Lipid Profile: No results for input(s): CHOL, HDL, LDLCALC, TRIG, CHOLHDL, LDLDIRECT in the last 72 hours. Thyroid Function Tests: No results for input(s): TSH, T4TOTAL, FREET4, T3FREE, THYROIDAB in the last 72 hours. Anemia Panel: No results for input(s): VITAMINB12, FOLATE, FERRITIN, TIBC, IRON, RETICCTPCT in the last 72 hours. Urine analysis:    Component Value Date/Time   COLORURINE YELLOW 12/08/2013 1846   APPEARANCEUR CLOUDY (A) 12/08/2013 1846   LABSPEC  1.013 12/08/2013 1846   PHURINE 5.5 12/08/2013 1846   GLUCOSEU NEGATIVE 12/08/2013 1846   HGBUR NEGATIVE 12/08/2013 1846   BILIRUBINUR NEGATIVE 12/08/2013 1846   KETONESUR NEGATIVE 12/08/2013 1846   PROTEINUR NEGATIVE 12/08/2013 1846   UROBILINOGEN 1.0 12/08/2013 1846   NITRITE NEGATIVE 12/08/2013 1846   LEUKOCYTESUR NEGATIVE 12/08/2013 1846   Sepsis Labs: @LABRCNTIP (procalcitonin:4,lacticidven:4) )No results found for this or any previous visit (from the past 240 hour(s)).   Radiological Exams on Admission: Dg Pelvis Portable  Result Date: 02/16/2018 CLINICAL DATA:  Fall.  Right  hip pain. EXAM: PORTABLE PELVIS 1-2 VIEWS COMPARISON:  None. FINDINGS: There is a fracture of the mid right femoral neck. No significant fracture comminution. Fracture is displaced, distal component displacing superiorly by 1.3 cm. No significant varus or valgus angulation. No other fractures. Skeletal structures are demineralized. No bone lesions. Hip joints, SI joints and symphysis pubis are normally aligned. IMPRESSION: 1. Mildly displaced right femoral neck fracture as described. No other fractures. No dislocation Electronically Signed   By: Lajean Manes M.D.   On: 02/16/2018 17:41   Dg Chest Port 1 View  Result Date: 02/16/2018 CLINICAL DATA:  Fall with hip pain. EXAM: PORTABLE CHEST 1 VIEW COMPARISON:  12/16/2013 FINDINGS: 1652 hours. Low volume film. Cardiopericardial silhouette is at upper limits of normal for size. Interstitial markings are diffusely coarsened with chronic features. There is pulmonary vascular congestion without overt pulmonary edema. Patchy opacity at the lung bases is similar to prior and likely reflects scarring although pneumonia not excluded. Bones are diffusely demineralized. Telemetry leads overlie the chest. IMPRESSION: 1. Low volume film with borderline cardiomegaly and vascular congestion. 2. Patchy opacity at the lung bases likely reflects scarring although pneumonia not excluded.  Electronically Signed   By: Misty Stanley M.D.   On: 02/16/2018 17:40   Dg Femur Portable Min 2 Views Right  Result Date: 02/16/2018 CLINICAL DATA:  Fall today. Lateral right hip pain. Pt denies chest pain and SOB. No previous injury or surgery to the right hip.a EXAM: RIGHT FEMUR PORTABLE 2 VIEW COMPARISON:  None. FINDINGS: Fracture of the femoral neck, mildly displaced, shaft component displacing superiorly by 8-9 mm. No fracture comminution. No other fractures.  Hip and knee joints are normally aligned. IMPRESSION: 1. Right femoral neck fracture. No other fractures. No dislocation. Electronically Signed   By: Lajean Manes M.D.   On: 02/16/2018 17:40    EKG: Independently reviewed.  It shows right bundle branch block and left anterior fascicular block.  It appears to be irregular.  Assessment/Plan Principal Problem:   Displaced articular fracture of head of right femur, initial encounter for closed fracture (HCC) Active Problems:   HTN (hypertension)   Hyperlipemia   CKD (chronic kidney disease) stage 3, GFR 30-59 ml/min (HCC)   Dementia, senile ([place])     #1 mildly displaced right femoral neck fracture: Patient will be admitted for medical clearance.  He will get pain management with PT and OT.  Orthopedic already consulted.  Surgery most likely tomorrow.  No known history of coronary artery disease.  I will check his repeat EKG if abnormal may order echocardiogram.  #2 status post fall: Mechanical in nature.  PT and OT consultation postoperatively.  #3 hypertension: Resume home medication and continue.  #4 chronic kidney disease stage III: Appears to be close to baseline.  Gentle hydration and follow.  #5 hyperlipidemia: Continue with statin.  #6 dementia: Most likely at baseline.  Continue treatment.   DVT prophylaxis: Lovenox Code Status: Full code Family Communication: Discussed care with the patient and family Disposition Plan: To be determined Consults called: Dr. Lucia Gaskins  of orthopedics Admission status: Inpatient  Severity of Illness: The appropriate patient status for this patient is INPATIENT. Inpatient status is judged to be reasonable and necessary in order to provide the required intensity of service to ensure the patient's safety. The patient's presenting symptoms, physical exam findings, and initial radiographic and laboratory data in the context of their chronic comorbidities is felt to place them at high risk for further clinical deterioration.  Furthermore, it is not anticipated that the patient will be medically stable for discharge from the hospital within 2 midnights of admission. The following factors support the patient status of inpatient.   " The patient's presenting symptoms include fall. " The worrisome physical exam findings include lateral rotation of right hip. " The initial radiographic and laboratory data are worrisome because of x-ray showing femoral neck fracture. " The chronic co-morbidities include dementia.   * I certify that at the point of admission it is my clinical judgment that the patient will require inpatient hospital care spanning beyond 2 midnights from the point of admission due to high intensity of service, high risk for further deterioration and high frequency of surveillance required.Barbette Merino MD [facility] Pager [phone]  If 7PM-7AM, please contact night-coverage [email or web address] Password [email or web address]  02/16/2018, 6:18 PM

## 2018-02-16 NOTE — Progress Notes (Signed)
Received Pt on a stretcher, alert and oriented x 4, no acute distress, complained of pain 5/10 during transfer to bed, vital signs taken and recorded. Will continue to monitor and endorse accordingly.

## 2018-02-16 NOTE — ED Triage Notes (Signed)
Pt arrives to ED from home with complaints of right hip pain after a fall. EMS reports pt was getting up to go to the bathroom and fell, at first he had no pain but after he got himself up and laid in bed the pain started to come in his hip. Pt placed in position of comfort with bed locked and lowered, call bell in reach.

## 2018-02-16 NOTE — ED Provider Notes (Signed)
[place] EMERGENCY DEPARTMENT Provider Note   CSN: [id number] Arrival date & time: 02/16/18  1622     History   Chief Complaint Chief Complaint  Patient presents with  . Fall    HPI [name] is a 85 y.o. male.  The history is provided by the patient and the EMS personnel. No language interpreter was used.  Fall  This is a new problem. The current episode started less than 1 hour ago. The problem occurs constantly. The problem has not changed since onset.Pertinent negatives include no chest pain, no abdominal pain, no headaches and no shortness of breath. Nothing aggravates the symptoms. Nothing relieves the symptoms. He has tried nothing for the symptoms. The treatment provided no relief.   Patient is a 85 year old male with a past medical history of bladder cancer, HTN, HDL, and CVA who presents for evaluation after a ground-level mechanical fall.  Patient states he stumbled and fell onto his right hip.  He states he felt immediate pain in his right hip and has had difficulty ambulating states he cannot bear weight after the fall.  He denies hitting his head, LOC, vomiting, or subsequent head pain, neck pain, chest pain, abdominal pain, or any other extremity pain.  He denies prior similar episodes.  Denies alleviating or aggravating factors.  Denies being on blood thinners.  Denies any presyncopal or syncopal symptoms.  Denies any seizure history.  Past Medical History:  Diagnosis Date  . Acid reflux   . Bladder cancer ([place])   . Cancer ([place])   . High cholesterol   . Hypertension   . Renal cancer ([place])   . Stroke [facility])     Patient Active Problem List   Diagnosis Date Noted  . Displaced articular fracture of head of right femur, initial encounter for closed fracture ([place]) 02/16/2018  . Dementia, senile ([place]) 12/01/2013  . Confusion 11/30/2013  . Macular degeneration 11/30/2013  . HTN (hypertension) 11/30/2013  . Hyperlipemia 11/30/2013  . H/O renal  cell cancer 11/30/2013  . CKD (chronic kidney disease) stage 3, GFR 30-59 ml/min (HCC) 11/30/2013  . BLADDER CANCER 10/31/2007  . KIDNEY CANCER 10/31/2007  . ACUT GASTROJEJUN ULCER W/HEMORR W/O MENTION OBST 10/31/2007    Past Surgical History:  Procedure Laterality Date  . NEPHRECTOMY          Home Medications    Prior to Admission medications   Medication Sig Start Date End Date Taking? Authorizing Provider  acetaminophen (TYLENOL) 325 MG tablet Take 650 mg by mouth every 6 (six) hours as needed (for pain).    Yes [provider]  omeprazole (PRILOSEC) 40 MG capsule Take 40 mg by mouth at bedtime.  12/05/13  Yes [provider]  sertraline (ZOLOFT) 100 MG tablet Take 100 mg by mouth at bedtime.  08/01/13  Yes [provider]  LORazepam (ATIVAN) 1 MG tablet Take 1 tablet (1 mg total) by mouth every 6 (six) hours as needed for anxiety. Patient not taking: Reported on 02/16/2018 12/16/13   Veryl Speak, MD  QUEtiapine (SEROQUEL) 25 MG tablet Take 1 tablet (25 mg total) by mouth at bedtime. Patient not taking: Reported on 02/16/2018 12/01/13   Domenic Polite, MD    Family History History reviewed. No pertinent family history.  Social History Social History   Tobacco Use  . Smoking status: Former Smoker  Substance Use Topics  . Alcohol use: No  . Drug use: No     Allergies   Morphine  Review of Systems Review of Systems  Constitutional: Negative for chills and fever.  HENT: Negative for ear pain and sore throat.   Eyes: Negative for pain and visual disturbance.  Respiratory: Negative for cough and shortness of breath.   Cardiovascular: Negative for chest pain and palpitations.  Gastrointestinal: Negative for abdominal pain and vomiting.  Genitourinary: Negative for dysuria and hematuria.  Musculoskeletal: Positive for arthralgias ( R hip), gait problem ( pain bearing weight on RLE) and myalgias ( R hip). Negative for back pain.  Skin:  Negative for color change and rash.  Neurological: Negative for seizures, syncope and headaches.  All other systems reviewed and are negative.    Physical Exam Updated Vital Signs BP 127/89   Pulse 99   Temp 97.6 F (36.4 C) (Oral)   Resp 17   Ht 5\' 8"  (1.727 m)   Wt 77.1 kg   SpO2 91%   BMI 25.85 kg/m   Physical Exam Vitals signs and nursing note reviewed.  Constitutional:      Appearance: Normal appearance. He is well-developed and normal weight.  HENT:     Head: Normocephalic and atraumatic.     Right Ear: External ear normal.     Left Ear: External ear normal.     Nose: Nose normal.     Mouth/Throat:     Mouth: Mucous membranes are moist.     Pharynx: Oropharynx is clear.  Eyes:     Conjunctiva/sclera: Conjunctivae normal.  Neck:     Musculoskeletal: Neck supple.  Cardiovascular:     Rate and Rhythm: Normal rate and regular rhythm.     Pulses: Normal pulses.          Radial pulses are 2+ on the right side and 2+ on the left side.       Dorsalis pedis pulses are 2+ on the right side and 2+ on the left side.     Heart sounds: No murmur.  Pulmonary:     Effort: Pulmonary effort is normal. No respiratory distress.     Breath sounds: Normal breath sounds.  Abdominal:     Palpations: Abdomen is soft.     Tenderness: There is no abdominal tenderness.  Musculoskeletal:     Right hip: He exhibits decreased range of motion and decreased strength. He exhibits no deformity.     Right knee: He exhibits normal range of motion, no effusion and no deformity.     Right ankle: He exhibits normal range of motion and no deformity.     Cervical back: He exhibits no bony tenderness.     Thoracic back: He exhibits no bony tenderness.     Lumbar back: He exhibits no bony tenderness.  Skin:    General: Skin is warm and dry.     Capillary Refill: Capillary refill takes less than 2 seconds.  Neurological:     General: No focal deficit present.     Mental Status: He is alert.     Patient does have some tenderness palpation over the lateral aspect of the proximal right thigh.  There is no significant hematoma or ecchymosis. ED Treatments / Results  Labs (all labs ordered are listed, but only abnormal results are displayed) Labs Reviewed  COMPREHENSIVE METABOLIC PANEL - Abnormal; Notable for the following components:      Result Value   Glucose, Bld 161 (*)    BUN 26 (*)    Creatinine, Ser 1.79 (*)    GFR calc non Af Amer 32 (*)  GFR calc Af Amer 37 (*)    All other components within normal limits  CBC - Abnormal; Notable for the following components:   WBC 13.5 (*)    All other components within normal limits  I-STAT CHEM 8, ED - Abnormal; Notable for the following components:   BUN 34 (*)    Creatinine, Ser 1.70 (*)    Glucose, Bld 155 (*)    Calcium, Ion 1.13 (*)    All other components within normal limits  CDS SEROLOGY  ETHANOL  PROTIME-INR  URINALYSIS, ROUTINE W REFLEX MICROSCOPIC  CBC  CREATININE, SERUM  COMPREHENSIVE METABOLIC PANEL  I-STAT CG4 LACTIC ACID, ED  SAMPLE TO BLOOD BANK    EKG None  Radiology Dg Pelvis Portable  Result Date: 02/16/2018 CLINICAL DATA:  Fall.  Right hip pain. EXAM: PORTABLE PELVIS 1-2 VIEWS COMPARISON:  None. FINDINGS: There is a fracture of the mid right femoral neck. No significant fracture comminution. Fracture is displaced, distal component displacing superiorly by 1.3 cm. No significant varus or valgus angulation. No other fractures. Skeletal structures are demineralized. No bone lesions. Hip joints, SI joints and symphysis pubis are normally aligned. IMPRESSION: 1. Mildly displaced right femoral neck fracture as described. No other fractures. No dislocation Electronically Signed   By: Lajean Manes M.D.   On: 02/16/2018 17:41   Dg Chest Port 1 View  Result Date: 02/16/2018 CLINICAL DATA:  Fall with hip pain. EXAM: PORTABLE CHEST 1 VIEW COMPARISON:  12/16/2013 FINDINGS: 1652 hours. Low volume film.  Cardiopericardial silhouette is at upper limits of normal for size. Interstitial markings are diffusely coarsened with chronic features. There is pulmonary vascular congestion without overt pulmonary edema. Patchy opacity at the lung bases is similar to prior and likely reflects scarring although pneumonia not excluded. Bones are diffusely demineralized. Telemetry leads overlie the chest. IMPRESSION: 1. Low volume film with borderline cardiomegaly and vascular congestion. 2. Patchy opacity at the lung bases likely reflects scarring although pneumonia not excluded. Electronically Signed   By: Misty Stanley M.D.   On: 02/16/2018 17:40   Dg Femur Portable Min 2 Views Right  Result Date: 02/16/2018 CLINICAL DATA:  Fall today. Lateral right hip pain. Pt denies chest pain and SOB. No previous injury or surgery to the right hip.a EXAM: RIGHT FEMUR PORTABLE 2 VIEW COMPARISON:  None. FINDINGS: Fracture of the femoral neck, mildly displaced, shaft component displacing superiorly by 8-9 mm. No fracture comminution. No other fractures.  Hip and knee joints are normally aligned. IMPRESSION: 1. Right femoral neck fracture. No other fractures. No dislocation. Electronically Signed   By: Lajean Manes M.D.   On: 02/16/2018 17:40    Procedures Procedures (including critical care time)  Medications Ordered in ED Medications  sertraline (ZOLOFT) tablet 100 mg (has no administration in time range)  pantoprazole (PROTONIX) EC tablet 40 mg (has no administration in time range)  enoxaparin (LOVENOX) injection 30 mg (has no administration in time range)  dextrose 5 %-0.45 % sodium chloride infusion (has no administration in time range)  acetaminophen (TYLENOL) tablet 650 mg (has no administration in time range)    Or  acetaminophen (TYLENOL) suppository 650 mg (has no administration in time range)  traMADol (ULTRAM) tablet 50 mg (has no administration in time range)  ondansetron (ZOFRAN) tablet 4 mg (has no  administration in time range)    Or  ondansetron (ZOFRAN) injection 4 mg (has no administration in time range)  lactated ringers bolus 1,000 mL (1,000 mLs Intravenous Transfusing/Transfer 02/16/18  1846)  acetaminophen (TYLENOL) tablet 1,000 mg (1,000 mg Oral Given 02/16/18 1805)  fentaNYL (SUBLIMAZE) injection 50 mcg (50 mcg Intravenous Given 02/16/18 1805)     Initial Impression / Assessment and Plan / ED Course  I have reviewed the triage vital signs and the nursing notes.  Pertinent labs & imaging results that were available during my care of the patient were reviewed by me and considered in my medical decision making (see chart for details).     Patient is a 85 year old male who presents above-stated history exam.  On presentation patient is afebrile stable vital signs.  Exam as above remarkable for decrease strength and active range of motion at the right hip with mild tenderness palpation over the lateral aspect of the right thigh.  There is no gross deformity patient is otherwise neurovascular intact in the right lower extremity.  Chest x-ray shows no acute traumatic injuries including rib fractures or pneumothorax.  Pelvis x-ray shows a mildly displaced right femoral neck fracture.  I spoke with on-call orthopedist Dr. Lucia Gaskins who stated he would repair the patient's fracture tomorrow morning.  CMP shows a creatinine of 1.79 compared to 1.35 prior level.  No other significant metabolic or electrolyte abnormalities.  CBC shows a WBC count 15.5, hemoglobin 14.6, platelets 171.  ECG shows a poor tracing with normal axis, normal intervals, right bundle branch block, no other signs of acute ischemic change.  Patient given IV fluids and IV analgesia while in the emergency department.  Patient admitted to hospital service in stable condition for further evaluation and management.   Final Clinical Impressions(s) / ED Diagnoses   Final diagnoses:  Fall, initial encounter  AKI (acute kidney  injury) ([place])  Lymphocytosis  Closed fracture of neck of right femur, initial encounter [facility])    ED Discharge Orders    None       Hulan Saas, MD 02/16/18 1918    Carmin Muskrat, MD 02/16/18 2011

## 2018-02-17 ENCOUNTER — Encounter (HOSPITAL_COMMUNITY)
Admission: EM | Disposition: A | Discharge status: Skilled Nursing Facility | Payer: Self-pay | Source: Home / Self Care | Attending: Internal Medicine

## 2018-02-17 ENCOUNTER — Inpatient Hospital Stay (HOSPITAL_COMMUNITY): Payer: Medicare Other

## 2018-02-17 ENCOUNTER — Inpatient Hospital Stay (HOSPITAL_COMMUNITY): Payer: Medicare Other | Admitting: Anesthesiology

## 2018-02-17 DIAGNOSIS — S72001A Fracture of unspecified part of neck of right femur, initial encounter for closed fracture: Secondary | ICD-10-CM

## 2018-02-17 HISTORY — PX: TOTAL HIP ARTHROPLASTY: SHX124

## 2018-02-17 LAB — COMPREHENSIVE METABOLIC PANEL
ALT: 17 U/L (ref 0–44)
AST: 29 U/L (ref 15–41)
Albumin: 3.1 g/dL — ABNORMAL LOW (ref 3.5–5.0)
Alkaline Phosphatase: 75 U/L (ref 38–126)
Anion gap: 13 (ref 5–15)
BUN: 22 mg/dL (ref 8–23)
CO2: 22 mmol/L (ref 22–32)
Calcium: 8.8 mg/dL — ABNORMAL LOW (ref 8.9–10.3)
Chloride: 101 mmol/L (ref 98–111)
Creatinine, Ser: 1.7 mg/dL — ABNORMAL HIGH (ref 0.61–1.24)
GFR calc Af Amer: 40 mL/min — ABNORMAL LOW (ref >60)
GFR calc non Af Amer: 34 mL/min — ABNORMAL LOW (ref >60)
Glucose, Bld: 135 mg/dL — ABNORMAL HIGH (ref 70–99)
POTASSIUM: 3.8 mmol/L (ref 3.5–5.1)
Sodium: 136 mmol/L (ref 135–145)
Total Bilirubin: 1.3 mg/dL — ABNORMAL HIGH (ref 0.3–1.2)
Total Protein: 7 g/dL (ref 6.5–8.1)

## 2018-02-17 LAB — MRSA PCR SCREENING: MRSA by PCR: NEGATIVE

## 2018-02-17 SURGERY — ARTHROPLASTY, HIP, TOTAL, ANTERIOR APPROACH
Anesthesia: Spinal | Site: Hip | Laterality: Right

## 2018-02-17 MED ORDER — CEFAZOLIN SODIUM-DEXTROSE 2-3 GM-%(50ML) IV SOLR
INTRAVENOUS | Status: DC | PRN
  Administered 2018-02-17: 2 g via INTRAVENOUS

## 2018-02-17 MED ORDER — 0.9 % SODIUM CHLORIDE (POUR BTL) OPTIME
TOPICAL | Status: DC | PRN
  Administered 2018-02-17: 1000 mL

## 2018-02-17 MED ORDER — TRAZODONE HCL 100 MG PO TABS
100.0000 mg | ORAL_TABLET | Freq: Every evening | ORAL | Status: DC | PRN
  Administered 2018-02-17: 100 mg via ORAL
  Filled 2018-02-17: qty 1

## 2018-02-17 MED ORDER — PROPOFOL 10 MG/ML IV BOLUS
INTRAVENOUS | Status: AC
  Filled 2018-02-17: qty 60

## 2018-02-17 MED ORDER — POLYETHYLENE GLYCOL 3350 17 G PO PACK: 17.0000 g | PACK | Freq: Every day | ORAL | Status: DC | PRN

## 2018-02-17 MED ORDER — ENOXAPARIN SODIUM 40 MG/0.4ML ~~LOC~~ SOLN: 40.0000 mg | Freq: Every day | SUBCUTANEOUS | 0 refills | Status: DC

## 2018-02-17 MED ORDER — VANCOMYCIN HCL 1000 MG IV SOLR
INTRAVENOUS | Status: AC
  Filled 2018-02-17: qty 1000

## 2018-02-17 MED ORDER — VANCOMYCIN HCL POWD
Status: DC | PRN
  Administered 2018-02-17: 1000 mg via TOPICAL

## 2018-02-17 MED ORDER — CEFAZOLIN SODIUM-DEXTROSE 2-4 GM/100ML-% IV SOLN
2.0000 g | Freq: Four times a day (QID) | INTRAVENOUS | Status: AC
  Administered 2018-02-17 (×3): 2 g via INTRAVENOUS
  Filled 2018-02-17 (×3): qty 100

## 2018-02-17 MED ORDER — PROPOFOL 500 MG/50ML IV EMUL
INTRAVENOUS | Status: DC | PRN
  Administered 2018-02-17: 50 ug/kg/min via INTRAVENOUS

## 2018-02-17 MED ORDER — SENNOSIDES-DOCUSATE SODIUM 8.6-50 MG PO TABS: 2.0000 | ORAL_TABLET | Freq: Every evening | ORAL | Status: DC | PRN

## 2018-02-17 MED ORDER — ONDANSETRON HCL 4 MG/2ML IJ SOLN: 4.0000 mg | Freq: Four times a day (QID) | INTRAMUSCULAR | Status: DC | PRN

## 2018-02-17 MED ORDER — TRANEXAMIC ACID 1000 MG/10ML IV SOLN
2000.0000 mg | INTRAVENOUS | Status: AC
  Filled 2018-02-17: qty 20

## 2018-02-17 MED ORDER — HYDROCODONE-ACETAMINOPHEN 5-325 MG PO TABS
1.0000 | ORAL_TABLET | ORAL | Status: DC | PRN
  Administered 2018-02-17: 2 via ORAL
  Administered 2018-02-18: 1 via ORAL
  Administered 2018-02-18: 2 via ORAL
  Filled 2018-02-17 (×4): qty 2

## 2018-02-17 MED ORDER — DOCUSATE SODIUM 100 MG PO CAPS
100.0000 mg | ORAL_CAPSULE | Freq: Two times a day (BID) | ORAL | Status: DC
  Administered 2018-02-17 – 2018-02-22 (×9): 100 mg via ORAL
  Filled 2018-02-17 (×9): qty 1

## 2018-02-17 MED ORDER — TRANEXAMIC ACID 1000 MG/10ML IV SOLN
INTRAVENOUS | Status: DC | PRN
  Administered 2018-02-17: 2000 mg via TOPICAL

## 2018-02-17 MED ORDER — MAGNESIUM CITRATE PO SOLN: 1.0000 | Freq: Once | ORAL | Status: DC | PRN

## 2018-02-17 MED ORDER — PROPOFOL 1000 MG/100ML IV EMUL
INTRAVENOUS | Status: AC
  Filled 2018-02-17: qty 100

## 2018-02-17 MED ORDER — SODIUM CHLORIDE 0.9 % IR SOLN
Status: DC | PRN
  Administered 2018-02-17: 3000 mL

## 2018-02-17 MED ORDER — TRANEXAMIC ACID-NACL 1000-0.7 MG/100ML-% IV SOLN
1000.0000 mg | INTRAVENOUS | Status: AC
  Administered 2018-02-17: 1000 mg via INTRAVENOUS
  Filled 2018-02-17: qty 100

## 2018-02-17 MED ORDER — METHOCARBAMOL 1000 MG/10ML IJ SOLN
500.0000 mg | Freq: Four times a day (QID) | INTRAVENOUS | Status: DC | PRN
  Filled 2018-02-17: qty 5

## 2018-02-17 MED ORDER — PHENOL 1.4 % MT LIQD: 1.0000 | OROMUCOSAL | Status: DC | PRN

## 2018-02-17 MED ORDER — PROPOFOL 10 MG/ML IV BOLUS
INTRAVENOUS | Status: DC | PRN
  Administered 2018-02-17: 30 mg via INTRAVENOUS

## 2018-02-17 MED ORDER — FENTANYL CITRATE (PF) 250 MCG/5ML IJ SOLN
INTRAMUSCULAR | Status: AC
  Filled 2018-02-17: qty 5

## 2018-02-17 MED ORDER — ACETAMINOPHEN 500 MG PO TABS
500.0000 mg | ORAL_TABLET | Freq: Four times a day (QID) | ORAL | Status: AC
  Administered 2018-02-17 – 2018-02-18 (×4): 500 mg via ORAL
  Filled 2018-02-17 (×3): qty 1

## 2018-02-17 MED ORDER — SODIUM CHLORIDE 0.9 % IV SOLN
INTRAVENOUS | Status: DC | PRN
  Administered 2018-02-17: 25 ug/min via INTRAVENOUS

## 2018-02-17 MED ORDER — SORBITOL 70 % SOLN: 30.0000 mL | Freq: Every day | Status: DC | PRN

## 2018-02-17 MED ORDER — ACETAMINOPHEN 325 MG PO TABS: 325.0000 mg | ORAL_TABLET | Freq: Four times a day (QID) | ORAL | Status: DC | PRN

## 2018-02-17 MED ORDER — ONDANSETRON HCL 4 MG PO TABS: 4.0000 mg | ORAL_TABLET | Freq: Four times a day (QID) | ORAL | Status: DC | PRN

## 2018-02-17 MED ORDER — HYDROCODONE-ACETAMINOPHEN 7.5-325 MG PO TABS
1.0000 | ORAL_TABLET | ORAL | Status: DC | PRN
  Administered 2018-02-18: 2 via ORAL
  Filled 2018-02-17: qty 2

## 2018-02-17 MED ORDER — LACTATED RINGERS IV SOLN
INTRAVENOUS | Status: DC | PRN
  Administered 2018-02-17: 08:00:00 via INTRAVENOUS

## 2018-02-17 MED ORDER — BUPIVACAINE IN DEXTROSE 0.75-8.25 % IT SOLN
INTRATHECAL | Status: DC | PRN
  Administered 2018-02-17: 1.8 mL via INTRATHECAL

## 2018-02-17 MED ORDER — MENTHOL 3 MG MT LOZG: 1.0000 | LOZENGE | OROMUCOSAL | Status: DC | PRN

## 2018-02-17 MED ORDER — METHOCARBAMOL 500 MG PO TABS
500.0000 mg | ORAL_TABLET | Freq: Four times a day (QID) | ORAL | Status: DC | PRN
  Administered 2018-02-18: 500 mg via ORAL
  Filled 2018-02-17 (×2): qty 1

## 2018-02-17 MED ORDER — SODIUM CHLORIDE 0.9 % IV SOLN: INTRAVENOUS | Status: AC

## 2018-02-17 MED ORDER — SODIUM CHLORIDE 0.9 % IV SOLN
INTRAVENOUS | Status: DC
  Administered 2018-02-17: 13:00:00 via INTRAVENOUS

## 2018-02-17 MED ORDER — CEFAZOLIN SODIUM-DEXTROSE 2-4 GM/100ML-% IV SOLN
INTRAVENOUS | Status: AC
  Filled 2018-02-17: qty 100

## 2018-02-17 MED ORDER — ENOXAPARIN SODIUM 30 MG/0.3ML ~~LOC~~ SOLN
30.0000 mg | SUBCUTANEOUS | Status: DC
  Administered 2018-02-17 – 2018-02-20 (×4): 30 mg via SUBCUTANEOUS
  Filled 2018-02-17 (×4): qty 0.3

## 2018-02-17 MED ORDER — HYDROCODONE-ACETAMINOPHEN 7.5-325 MG PO TABS: 1.0000 | ORAL_TABLET | Freq: Four times a day (QID) | ORAL | 0 refills | Status: DC | PRN

## 2018-02-17 MED ORDER — ALUM & MAG HYDROXIDE-SIMETH 200-200-20 MG/5ML PO SUSP: 30.0000 mL | ORAL | Status: DC | PRN

## 2018-02-17 SURGICAL SUPPLY — 57 items
BAG DECANTER FOR FLEXI CONT (MISCELLANEOUS) ×3 IMPLANT
BIPOLAR PROS AML 55 (Hips) ×2 IMPLANT
BIPOLAR PROS AML 55MM (Hips) ×1 IMPLANT
CELLS DAT CNTRL 66122 CELL SVR (MISCELLANEOUS) IMPLANT
COVER PERINEAL POST (MISCELLANEOUS) ×3 IMPLANT
COVER SURGICAL LIGHT HANDLE (MISCELLANEOUS) ×3 IMPLANT
COVER WAND RF STERILE (DRAPES) ×3 IMPLANT
DRAPE C-ARM 42X72 X-RAY (DRAPES) ×3 IMPLANT
DRAPE POUCH INSTRU U-SHP 10X18 (DRAPES) ×3 IMPLANT
DRAPE STERI IOBAN 125X83 (DRAPES) ×3 IMPLANT
DRAPE U-SHAPE 47X51 STRL (DRAPES) ×6 IMPLANT
DRSG AQUACEL AG ADV 3.5X10 (GAUZE/BANDAGES/DRESSINGS) IMPLANT
DRSG MEPILEX BORDER 4X8 (GAUZE/BANDAGES/DRESSINGS) ×3 IMPLANT
DURAPREP 26ML APPLICATOR (WOUND CARE) ×6 IMPLANT
ELECT BLADE 4.0 EZ CLEAN MEGAD (MISCELLANEOUS) ×3
ELECT REM PT RETURN 9FT ADLT (ELECTROSURGICAL) ×3
ELECTRODE BLDE 4.0 EZ CLN MEGD (MISCELLANEOUS) ×1 IMPLANT
ELECTRODE REM PT RTRN 9FT ADLT (ELECTROSURGICAL) ×1 IMPLANT
GAUZE XEROFORM 1X8 LF (GAUZE/BANDAGES/DRESSINGS) ×3 IMPLANT
GLOVE BIOGEL PI IND STRL 7.0 (GLOVE) ×1 IMPLANT
GLOVE BIOGEL PI INDICATOR 7.0 (GLOVE) ×2
GLOVE ECLIPSE 7.0 STRL STRAW (GLOVE) ×9 IMPLANT
GLOVE SKINSENSE NS SZ7.5 (GLOVE) ×2
GLOVE SKINSENSE STRL SZ7.5 (GLOVE) ×1 IMPLANT
GLOVE SURG SYN 7.5  E (GLOVE) ×8
GLOVE SURG SYN 7.5 E (GLOVE) ×4 IMPLANT
GOWN SRG XL XLNG 56XLVL 4 (GOWN DISPOSABLE) ×1 IMPLANT
GOWN STRL NON-REIN XL XLG LVL4 (GOWN DISPOSABLE) ×2
GOWN STRL REUS W/ TWL LRG LVL3 (GOWN DISPOSABLE) ×1 IMPLANT
GOWN STRL REUS W/ TWL XL LVL3 (GOWN DISPOSABLE) ×1 IMPLANT
GOWN STRL REUS W/TWL LRG LVL3 (GOWN DISPOSABLE) ×2
GOWN STRL REUS W/TWL XL LVL3 (GOWN DISPOSABLE) ×2
HANDPIECE INTERPULSE COAX TIP (DISPOSABLE) ×2
HEAD BIPOLAR PROS AML 55 (Hips) ×1 IMPLANT
HEAD FEM STD 28X+1.5 STRL (Hips) ×3 IMPLANT
HOOD PEEL AWAY FLYTE STAYCOOL (MISCELLANEOUS) ×6 IMPLANT
IV NS IRRIG 3000ML ARTHROMATIC (IV SOLUTION) ×3 IMPLANT
KIT BASIN OR (CUSTOM PROCEDURE TRAY) ×3 IMPLANT
MARKER SKIN DUAL TIP RULER LAB (MISCELLANEOUS) ×6 IMPLANT
NEEDLE SPNL 18GX3.5 QUINCKE PK (NEEDLE) ×3 IMPLANT
PACK TOTAL JOINT (CUSTOM PROCEDURE TRAY) ×3 IMPLANT
PACK UNIVERSAL I (CUSTOM PROCEDURE TRAY) ×3 IMPLANT
RTRCTR WOUND ALEXIS 18CM MED (MISCELLANEOUS)
SAW OSC TIP CART 19.5X105X1.3 (SAW) ×3 IMPLANT
SET HNDPC FAN SPRY TIP SCT (DISPOSABLE) ×1 IMPLANT
STAPLER VISISTAT 35W (STAPLE) ×3 IMPLANT
STEM CORAIL KA14 (Stem) ×3 IMPLANT
SUT ETHIBOND 2 V 37 (SUTURE) ×3 IMPLANT
SUT ETHILON 2 0 FS 18 (SUTURE) ×6 IMPLANT
SUT VIC AB 1 CT1 27 (SUTURE) ×2
SUT VIC AB 1 CT1 27XBRD ANBCTR (SUTURE) ×1 IMPLANT
SUT VIC AB 2-0 CT1 27 (SUTURE) ×4
SUT VIC AB 2-0 CT1 TAPERPNT 27 (SUTURE) ×2 IMPLANT
SYRINGE 60CC LL (MISCELLANEOUS) ×3 IMPLANT
TOWEL OR 17X26 10 PK STRL BLUE (TOWEL DISPOSABLE) ×3 IMPLANT
TRAY CATH 16FR W/PLASTIC CATH (SET/KITS/TRAYS/PACK) IMPLANT
YANKAUER SUCT BULB TIP NO VENT (SUCTIONS) IMPLANT

## 2018-02-17 NOTE — Transfer of Care (Signed)
Immediate Anesthesia Transfer of Care Note  Patient: [name]  Procedure(s) Performed: TOTAL HIP ARTHROPLASTY ANTERIOR APPROACH (Right Hip)  Patient Location: PACU  Anesthesia Type:Spinal  Level of Consciousness: sedated  Airway & Oxygen Therapy: Patient Spontanous Breathing and Patient connected to nasal cannula oxygen  Post-op Assessment: Report given to RN and Post -op Vital signs reviewed and stable  Post vital signs: Reviewed and stable  Last Vitals:  Vitals Value Taken Time  BP 106/59 02/17/2018 10:16 AM  Temp 36.5 C 02/17/2018 10:16 AM  Pulse 78 02/17/2018 10:20 AM  Resp 20 02/17/2018 10:20 AM  SpO2 93 % 02/17/2018 10:20 AM  Vitals shown include unvalidated device data.  Last Pain:  Vitals:   02/17/18 1016  TempSrc:   PainSc: Asleep      Patients Stated Pain Goal: 0 (38/18/29 9371)  Complications: No apparent anesthesia complications

## 2018-02-17 NOTE — Plan of Care (Signed)

## 2018-02-17 NOTE — Progress Notes (Signed)
PROGRESS NOTE    HAREL REPETTO  TKZ:[id number] DOB: 10/03/1923 DOA: 02/16/2018 PCP: Harlan Stains, MD   Brief Narrative:  85 year old male with history of bladder cancer, renal cell carcinoma, essential hypertension, GERD came to the hospital after sustaining a fall while getting going to the bathroom.  His x-ray in the ER showed mildly displaced right femoral neck fracture.  Patient underwent ORIF on 1/12 which he tolerated well.   Assessment & Plan:   Principal Problem:   Closed displaced fracture of right femoral neck (HCC) Active Problems:   HTN (hypertension)   Hyperlipemia   CKD (chronic kidney disease) stage 3, GFR 30-59 ml/min (HCC)   Dementia, senile (HCC)  Closed right femoral neck fracture Status post ORIF 02/17/2018 - Postop management per orthopedic, DVT prophylaxis per orthopedic -PT/OT -Pain control, bowel regimen -Incentive spirometry  Essential hypertension -Not on any medications  GERD -PPI  History of renal cell carcinoma -Follow-up outpatient with oncology and primary care provider.  Had an extensive discussion with the patient's daughter at bedside.  Have explained them that patient is adamant risk of delirium with prolonged hospitalization.  She understands this.  DVT prophylaxis: Lovenox Code Status: Full code Family Communication: Daughter at bedside Disposition Plan: To be determined  Consultants:   Orthopedic  Procedures:   ORIF of the right hip 1/12  Antimicrobials:   Perioperative, Ancef   Subjective: Patient is doing well postoperatively.  Does not have any complaints at this time.  Using incentive spirometry without any issues.  Review of Systems Otherwise negative except as per HPI, including: General: Denies fever, chills, night sweats or unintended weight loss. Resp: Denies cough, wheezing, shortness of breath. Cardiac: Denies chest pain, palpitations, orthopnea, paroxysmal nocturnal dyspnea. GI: Denies abdominal pain,  nausea, vomiting, diarrhea or constipation GU: Denies dysuria, frequency, hesitancy or incontinence MS: Denies muscle aches, joint pain or swelling Neuro: Denies headache, neurologic deficits (focal weakness, numbness, tingling), abnormal gait Psych: Denies anxiety, depression, SI/HI/AVH Skin: Denies new rashes or lesions ID: Denies sick contacts, exotic exposures, travel  Objective: Vitals:   02/17/18 1020 02/17/18 1025 02/17/18 1027 02/17/18 1054  BP: 110/66 (!) 116/53 (!) 109/57 123/61  Pulse: 78 85 80 73  Resp: 20 (!) 25 16 16   Temp:   97.9 F (36.6 C) (!) 97.5 F (36.4 C)  TempSrc:    Oral  SpO2: 93% 92% 95% 95%  Weight:      Height:        Intake/Output Summary (Last 24 hours) at 02/17/2018 1315 Last data filed at 02/17/2018 1027 Gross per 24 hour  Intake 636.25 ml  Output 475 ml  Net 161.25 ml   Filed Weights   02/16/18 1628  Weight: 77.1 kg    Examination:  General exam: Appears calm and comfortable  Respiratory system: Clear to auscultation. Respiratory effort normal. Cardiovascular system: S1 & S2 heard, RRR. No JVD, murmurs, rubs, gallops or clicks. No pedal edema. Gastrointestinal system: Abdomen is nondistended, soft and nontender. No organomegaly or masses felt. Normal bowel sounds heard. Central nervous system: Alert and oriented. No focal neurological deficits. Extremities: Symmetric 4 x 5 power.  Limited range of motion of the right hip secondary to the pain Skin: Right hip dressing noted which appears to be clean and dry. Psychiatry: Judgement and insight appear normal. Mood & affect appropriate.     Data Reviewed:   CBC: Recent Labs  Lab 02/16/18 1642 02/16/18 1656  WBC 13.5*  --   HGB 14.6 15.3  HCT 44.6 45.0  MCV 97.0  --   PLT 171  --    Basic Metabolic Panel: Recent Labs  Lab 02/16/18 1642 02/16/18 1656 02/17/18 0429  NA 139 139 136  K 4.3 4.5 3.8  CL 103 103 101  CO2 25  --  22  GLUCOSE 161* 155* 135*  BUN 26* 34* 22    CREATININE 1.79* 1.70* 1.70*  CALCIUM 9.3  --  8.8*   GFR: Estimated Creatinine Clearance: 26.8 mL/min (A) (by C-G formula based on SCr of 1.7 mg/dL (H)). Liver Function Tests: Recent Labs  Lab 02/16/18 1642 02/17/18 0429  AST 24 29  ALT 20 17  ALKPHOS 80 75  BILITOT 1.0 1.3*  PROT 8.0 7.0  ALBUMIN 3.5 3.1*   No results for input(s): LIPASE, AMYLASE in the last 168 hours. No results for input(s): AMMONIA in the last 168 hours. Coagulation Profile: Recent Labs  Lab 02/16/18 1642  INR 1.09   Cardiac Enzymes: No results for input(s): CKTOTAL, CKMB, CKMBINDEX, TROPONINI in the last 168 hours. BNP (last 3 results) No results for input(s): PROBNP in the last 8760 hours. HbA1C: No results for input(s): HGBA1C in the last 72 hours. CBG: No results for input(s): GLUCAP in the last 168 hours. Lipid Profile: No results for input(s): CHOL, HDL, LDLCALC, TRIG, CHOLHDL, LDLDIRECT in the last 72 hours. Thyroid Function Tests: No results for input(s): TSH, T4TOTAL, FREET4, T3FREE, THYROIDAB in the last 72 hours. Anemia Panel: No results for input(s): VITAMINB12, FOLATE, FERRITIN, TIBC, IRON, RETICCTPCT in the last 72 hours. Sepsis Labs: Recent Labs  Lab 02/16/18 1656  LATICACIDVEN 1.81    Recent Results (from the past 240 hour(s))  MRSA PCR Screening     Status: None   Collection Time: 02/17/18  4:56 AM  Result Value Ref Range Status   MRSA by PCR NEGATIVE NEGATIVE Final    Comment:        The GeneXpert MRSA Assay (FDA approved for NASAL specimens only), is one component of a comprehensive MRSA colonization surveillance program. It is not intended to diagnose MRSA infection nor to guide or monitor treatment for MRSA infections. Performed at [facility], [place] [address], [place], [place] [address]          Radiology Studies: Dg Pelvis Portable  Result Date: 02/16/2018 CLINICAL DATA:  Fall.  Right hip pain. EXAM: PORTABLE PELVIS 1-2 VIEWS COMPARISON:   None. FINDINGS: There is a fracture of the mid right femoral neck. No significant fracture comminution. Fracture is displaced, distal component displacing superiorly by 1.3 cm. No significant varus or valgus angulation. No other fractures. Skeletal structures are demineralized. No bone lesions. Hip joints, SI joints and symphysis pubis are normally aligned. IMPRESSION: 1. Mildly displaced right femoral neck fracture as described. No other fractures. No dislocation Electronically Signed   By: Lajean Manes M.D.   On: 02/16/2018 17:41   Dg Chest Port 1 View  Result Date: 02/16/2018 CLINICAL DATA:  Fall with hip pain. EXAM: PORTABLE CHEST 1 VIEW COMPARISON:  12/16/2013 FINDINGS: 1652 hours. Low volume film. Cardiopericardial silhouette is at upper limits of normal for size. Interstitial markings are diffusely coarsened with chronic features. There is pulmonary vascular congestion without overt pulmonary edema. Patchy opacity at the lung bases is similar to prior and likely reflects scarring although pneumonia not excluded. Bones are diffusely demineralized. Telemetry leads overlie the chest. IMPRESSION: 1. Low volume film with borderline cardiomegaly and vascular congestion. 2. Patchy opacity at the lung  bases likely reflects scarring although pneumonia not excluded. Electronically Signed   By: Misty Stanley M.D.   On: 02/16/2018 17:40   Dg Femur Portable Min 2 Views Right  Result Date: 02/16/2018 CLINICAL DATA:  Fall today. Lateral right hip pain. Pt denies chest pain and SOB. No previous injury or surgery to the right hip.a EXAM: RIGHT FEMUR PORTABLE 2 VIEW COMPARISON:  None. FINDINGS: Fracture of the femoral neck, mildly displaced, shaft component displacing superiorly by 8-9 mm. No fracture comminution. No other fractures.  Hip and knee joints are normally aligned. IMPRESSION: 1. Right femoral neck fracture. No other fractures. No dislocation. Electronically Signed   By: Lajean Manes M.D.   On: 02/16/2018  17:40        Scheduled Meds: . acetaminophen  500 mg Oral Q6H  . docusate sodium  100 mg Oral BID  . enoxaparin (LOVENOX) injection  30 mg Subcutaneous Q24H  . pantoprazole  40 mg Oral Daily  . sertraline  100 mg Oral QHS  . tranexamic acid (CYKLOKAPRON) topical -INTRAOP  2,000 mg Topical To OR   Continuous Infusions: . sodium chloride 75 mL/hr at 02/17/18 1255  .  ceFAZolin (ANCEF) IV 2 g (02/17/18 1257)  . dextrose 5 % and 0.45% NaCl 75 mL/hr at 02/16/18 2051  . methocarbamol (ROBAXIN) IV       LOS: 1 day   Time spent= 35 mins    Julien Oscar Arsenio Loader, MD [facility]  If 7PM-7AM, please contact night-coverage [email or web address] 02/17/2018, 1:15 PM

## 2018-02-17 NOTE — Consult Note (Signed)
ORTHOPAEDIC CONSULTATION  REQUESTING PHYSICIAN: Damita Lack, MD  Chief Complaint: Right hip fracture  HPI: [name] is a 85 y.o. male who presents with right hip fracture s/p mechanical fall PTA at home when getting up to use the bathroom.  He has baseline dementia.  The patient endorses severe pain in the right hip, that does not radiate, grinding in quality, worse with any movement, better with immobilization.  Denies LOC/fever/chills/nausea/vomiting.  Walks with assistive devices (walker, cane, wheelchair).  Denies LOC, neck pain, abd pain.  Family is also present to give HPI details  Past Medical History:  Diagnosis Date  . Acid reflux   . Bladder cancer ([place])   . Cancer ([place])   . High cholesterol   . Hypertension   . Renal cancer ([place])   . Stroke [facility])    Past Surgical History:  Procedure Laterality Date  . NEPHRECTOMY     Social History   Socioeconomic History  . Marital status: Widowed    Spouse name: Not on file  . Number of children: Not on file  . Years of education: Not on file  . Highest education level: Not on file  Occupational History  . Not on file  Social Needs  . Financial resource strain: Not on file  . Food insecurity:    Worry: Not on file    Inability: Not on file  . Transportation needs:    Medical: Not on file    Non-medical: Not on file  Tobacco Use  . Smoking status: Former Smoker  Substance and Sexual Activity  . Alcohol use: No  . Drug use: No  . Sexual activity: Not on file  Lifestyle  . Physical activity:    Days per week: Not on file    Minutes per session: Not on file  . Stress: Not on file  Relationships  . Social connections:    Talks on phone: Not on file    Gets together: Not on file    Attends religious service: Not on file    Active member of club or organization: Not on file    Attends meetings of clubs or organizations: Not on file    Relationship status: Not on file  Other Topics Concern  . Not on  file  Social History Narrative  . Not on file   History reviewed. No pertinent family history. Allergies  Allergen Reactions  . Morphine Other (See Comments)    Makes patient "crazy"    Prior to Admission medications   Medication Sig Start Date End Date Taking? Authorizing Provider  acetaminophen (TYLENOL) 325 MG tablet Take 650 mg by mouth every 6 (six) hours as needed (for pain).    Yes [provider]  omeprazole (PRILOSEC) 40 MG capsule Take 40 mg by mouth at bedtime.  12/05/13  Yes [provider]  sertraline (ZOLOFT) 100 MG tablet Take 100 mg by mouth at bedtime.  08/01/13  Yes [provider]  LORazepam (ATIVAN) 1 MG tablet Take 1 tablet (1 mg total) by mouth every 6 (six) hours as needed for anxiety. Patient not taking: Reported on 02/16/2018 12/16/13   Veryl Speak, MD  QUEtiapine (SEROQUEL) 25 MG tablet Take 1 tablet (25 mg total) by mouth at bedtime. Patient not taking: Reported on 02/16/2018 12/01/13   Domenic Polite, MD   Dg Pelvis Portable  Result Date: 02/16/2018 CLINICAL DATA:  Fall.  Right hip pain. EXAM: PORTABLE PELVIS 1-2 VIEWS COMPARISON:  None. FINDINGS: There is  a fracture of the mid right femoral neck. No significant fracture comminution. Fracture is displaced, distal component displacing superiorly by 1.3 cm. No significant varus or valgus angulation. No other fractures. Skeletal structures are demineralized. No bone lesions. Hip joints, SI joints and symphysis pubis are normally aligned. IMPRESSION: 1. Mildly displaced right femoral neck fracture as described. No other fractures. No dislocation Electronically Signed   By: Lajean Manes M.D.   On: 02/16/2018 17:41   Dg Chest Port 1 View  Result Date: 02/16/2018 CLINICAL DATA:  Fall with hip pain. EXAM: PORTABLE CHEST 1 VIEW COMPARISON:  12/16/2013 FINDINGS: 1652 hours. Low volume film. Cardiopericardial silhouette is at upper limits of normal for size. Interstitial markings are diffusely  coarsened with chronic features. There is pulmonary vascular congestion without overt pulmonary edema. Patchy opacity at the lung bases is similar to prior and likely reflects scarring although pneumonia not excluded. Bones are diffusely demineralized. Telemetry leads overlie the chest. IMPRESSION: 1. Low volume film with borderline cardiomegaly and vascular congestion. 2. Patchy opacity at the lung bases likely reflects scarring although pneumonia not excluded. Electronically Signed   By: Misty Stanley M.D.   On: 02/16/2018 17:40   Dg Femur Portable Min 2 Views Right  Result Date: 02/16/2018 CLINICAL DATA:  Fall today. Lateral right hip pain. Pt denies chest pain and SOB. No previous injury or surgery to the right hip.a EXAM: RIGHT FEMUR PORTABLE 2 VIEW COMPARISON:  None. FINDINGS: Fracture of the femoral neck, mildly displaced, shaft component displacing superiorly by 8-9 mm. No fracture comminution. No other fractures.  Hip and knee joints are normally aligned. IMPRESSION: 1. Right femoral neck fracture. No other fractures. No dislocation. Electronically Signed   By: Lajean Manes M.D.   On: 02/16/2018 17:40    All pertinent xrays, MRI, CT independently reviewed and interpreted  Positive ROS: All other systems have been reviewed and were otherwise negative with the exception of those mentioned in the HPI and as above.  Physical Exam: General: no acute distress Cardiovascular: No pedal edema Respiratory: No cyanosis, no use of accessory musculature GI: No organomegaly, abdomen is soft and non-tender Skin: No lesions in the area of chief complaint Neurologic: Sensation intact distally Psychiatric: Patient is mildly confused  Lymphatic: No axillary or cervical lymphadenopathy  MUSCULOSKELETAL:  - pain with movement of the hip and extremity - skin intact - NVI distally - compartments soft  Assessment: Right femoral neck hip fracture  Plan: - partial hip replacement is recommended,  patient and family are aware of r/b/a and wish to proceed - consent obtained - medical optimized by primary team - surgery is planned for this morning  Thank you for the consult and the opportunity to see [name]  N. Eduard Roux, MD [place] 7:44 AM

## 2018-02-17 NOTE — Anesthesia Procedure Notes (Signed)
Spinal  Patient location during procedure: OR Start time: 02/17/2018 8:18 AM End time: 02/17/2018 8:28 AM Staffing Anesthesiologist: Duane Boston, MD Performed: anesthesiologist  Preanesthetic Checklist Completed: patient identified, surgical consent, pre-op evaluation, timeout performed, IV checked, risks and benefits discussed and monitors and equipment checked Spinal Block Patient position: sitting Prep: DuraPrep Patient monitoring: cardiac monitor, continuous pulse ox and blood pressure Approach: midline Location: L2-3 Injection technique: single-shot Needle Needle type: Pencan  Needle gauge: 24 G Needle length: 9 cm Additional Notes Functioning IV was confirmed and monitors were applied. Sterile prep and drape, including hand hygiene and sterile gloves were used. The patient was positioned and the spine was prepped. The skin was anesthetized with lidocaine.  Free flow of clear CSF was obtained prior to injecting local anesthetic into the CSF.  The spinal needle aspirated freely following injection.  The needle was carefully withdrawn.  The patient tolerated the procedure well.

## 2018-02-17 NOTE — Op Note (Signed)
TOTAL HIP ARTHROPLASTY ANTERIOR APPROACH  Procedure Note [name]   [id number]  Pre-op Diagnosis: right hip fracture     Post-op Diagnosis: same   Operative Procedures  1. Prosthetic replacement for femoral neck fracture. CPT [phone]  Personnel  Surgeon(s): Leandrew Koyanagi, MD  ASSIST: Madalyn Rob, PA-C; necessary for the timely completion of procedure and due to complexity of procedure.   Anesthesia: spinal  Prosthesis: Depuy Femur: Corail KA 14 Head: 55 mm size: +1.5 Bearing Type: bipolar  Hip Hemiarthroplasty (Anterior Approach) Op Note:  After informed consent was obtained and the operative extremity marked in the holding area, the patient was brought back to the operating room and placed supine on the HANA table. Next, the operative extremity was prepped and draped in normal sterile fashion. Surgical timeout occurred verifying patient identification, surgical site, surgical procedure and administration of antibiotics.  A modified anterior Smith-Peterson approach to the hip was performed, using the interval between tensor fascia lata and sartorius.  Dissection was carried bluntly down onto the anterior hip capsule. The lateral femoral circumflex vessels were identified and coagulated. A capsulotomy was performed and the capsular flaps tagged for later repair.  Fluoroscopy was utilized to prepare for the femoral neck cut. The neck osteotomy was performed. The femoral head was removed and found a 55 mm head was the appropriate fit.    We then turned our attention to the femur.  After placing the femoral hook, the leg was taken to externally rotated, extended and adducted position taking care to perform soft tissue releases to allow for adequate mobilization of the femur. Soft tissue was cleared from the shoulder of the greater trochanter and the hook elevator used to improve exposure of the proximal femur. Sequential broaching performed up to a size 14. Trial neck and head  were placed. The leg was brought back up to neutral and the construct reduced. The position and sizing of components, offset and leg lengths were checked using fluoroscopy. Stability of the construct was checked in extension and external rotation without any subluxation or impingement of prosthesis. We dislocated the prosthesis, dropped the leg back into position, removed trial components, and irrigated copiously. The final stem and head was then placed, the leg brought back up, the system reduced and fluoroscopy used to verify positioning.  We irrigated, obtained hemostasis and closed the capsule using #2 ethibond suture.  The fascia was closed with #1 vicryl plus, the deep fat layer was closed with 0 vicryl, the subcutaneous layers closed with 2.0 Vicryl Plus and the skin closed with staples. A sterile dressing was applied. The patient was awakened in the operating room and taken to recovery in stable condition. All sponge, needle, and instrument counts were correct at the end of the case.   Position: supine  Complications: none.  Time Out: performed   Drains/Packing: none  Estimated blood loss: see anesthesia record  Returned to Recovery Room: in good condition.   Antibiotics: yes   Mechanical VTE (DVT) Prophylaxis: sequential compression devices, TED thigh-high  Chemical VTE (DVT) Prophylaxis: lovenox  Fluid Replacement: Crystalloid: see anesthesia record  Specimens Removed: 1 to pathology   Sponge and Instrument Count Correct? yes   PACU: portable radiograph - low AP   Admission: inpatient status, start PT & OT POD#1  Plan/RTC: Return in 2 weeks for staple removal. Return in 6 weeks to see MD.  Weight Bearing/Load Lower Extremity: full  Hip precautions: none  N. Eduard Roux, MD [facility]  [phone] 9:46 AM    Implant Name Type Inv. Item Serial No. Manufacturer Lot No. LRB No. Used  STEM CORAIL KA14 - [id number] Stem STEM CORAIL KA14  DEPUY SYNTHES [id number]  Right 1  BIPOLAR PROS AML 55MM - [id number] Hips BIPOLAR PROS AML 55MM  DEPUY SYNTHES [id number] Right 1  HIP BALL ARTICU DEPUY - [id number] Hips HIP BALL ARTICU DEPUY  DEPUY SYNTHES [id number] Right 1

## 2018-02-17 NOTE — Anesthesia Preprocedure Evaluation (Addendum)
Anesthesia Evaluation  Patient identified by MRN, date of birth, ID band Patient confused    Reviewed: Allergy & Precautions, NPO status , Patient's Chart, lab work & pertinent test results  History of Anesthesia Complications Negative for: history of anesthetic complications  Airway Mallampati: I  TM Distance: >3 FB Neck ROM: Full    Dental  (+) Dental Advisory Given, Upper Dentures, Lower Dentures   Pulmonary neg pulmonary ROS, former smoker,    Pulmonary exam normal        Cardiovascular hypertension, negative cardio ROS Normal cardiovascular exam     Neuro/Psych PSYCHIATRIC DISORDERS Dementia CVA    GI/Hepatic Neg liver ROS, PUD, GERD  Medicated,  Endo/Other  negative endocrine ROS  Renal/GU Renal InsufficiencyRenal diseaseRenal Cancer  negative genitourinary   Musculoskeletal negative musculoskeletal ROS (+)   Abdominal   Peds negative pediatric ROS (+)  Hematology negative hematology ROS (+)   Anesthesia Other Findings   Reproductive/Obstetrics negative OB ROS                            Anesthesia Physical Anesthesia Plan  ASA: III  Anesthesia Plan: Spinal   Post-op Pain Management:    Induction:   PONV Risk Score and Plan: 2 and Ondansetron and Propofol infusion  Airway Management Planned: Natural Airway and Simple Face Mask  Additional Equipment:   Intra-op Plan:   Post-operative Plan:   Informed Consent: I have reviewed the patients History and Physical, chart, labs and discussed the procedure including the risks, benefits and alternatives for the proposed anesthesia with the patient or authorized representative who has indicated his/her understanding and acceptance.   Dental advisory given and Consent reviewed with POA  Plan Discussed with: CRNA, Anesthesiologist and Surgeon  Anesthesia Plan Comments:        Anesthesia Quick Evaluation

## 2018-02-17 NOTE — Anesthesia Postprocedure Evaluation (Signed)
Anesthesia Post Note  Patient: [name]  Procedure(s) Performed: TOTAL HIP ARTHROPLASTY ANTERIOR APPROACH (Right Hip)     Patient location during evaluation: PACU Anesthesia Type: Spinal Level of consciousness: awake and alert Pain management: pain level controlled Vital Signs Assessment: post-procedure vital signs reviewed and stable Respiratory status: spontaneous breathing and respiratory function stable Cardiovascular status: blood pressure returned to baseline and stable Postop Assessment: spinal receding Anesthetic complications: no    Last Vitals:  Vitals:   02/17/18 1027 02/17/18 1054  BP: (!) 109/57 123/61  Pulse: 80 73  Resp: 16 16  Temp: 36.6 C (!) 36.4 C  SpO2: 95% 95%    Last Pain:  Vitals:   02/17/18 1054  TempSrc: Oral  PainSc:                  [name]

## 2018-02-18 LAB — BASIC METABOLIC PANEL
Anion gap: 8 (ref 5–15)
BUN: 18 mg/dL (ref 8–23)
CO2: 23 mmol/L (ref 22–32)
Calcium: 7.9 mg/dL — ABNORMAL LOW (ref 8.9–10.3)
Chloride: 103 mmol/L (ref 98–111)
Creatinine, Ser: 1.63 mg/dL — ABNORMAL HIGH (ref 0.61–1.24)
GFR calc non Af Amer: 36 mL/min — ABNORMAL LOW (ref >60)
GFR, EST AFRICAN AMERICAN: 42 mL/min — AB (ref >60)
Glucose, Bld: 128 mg/dL — ABNORMAL HIGH (ref 70–99)
Potassium: 3.9 mmol/L (ref 3.5–5.1)
Sodium: 134 mmol/L — ABNORMAL LOW (ref 135–145)

## 2018-02-18 LAB — CBC
HEMATOCRIT: 33.3 % — AB (ref 39.0–52.0)
Hemoglobin: 11 g/dL — ABNORMAL LOW (ref 13.0–17.0)
MCH: 31.4 pg (ref 26.0–34.0)
MCHC: 33 g/dL (ref 30.0–36.0)
MCV: 95.1 fL (ref 80.0–100.0)
Platelets: 118 10*3/uL — ABNORMAL LOW (ref 150–400)
RBC: 3.5 MIL/uL — ABNORMAL LOW (ref 4.22–5.81)
RDW: 13.1 % (ref 11.5–15.5)
WBC: 11.8 10*3/uL — ABNORMAL HIGH (ref 4.0–10.5)
nRBC: 0 % (ref 0.0–0.2)

## 2018-02-18 MED FILL — Vancomycin HCl For IV Soln 1 GM (Base Equivalent): INTRAVENOUS | Qty: 1000 | Status: AC

## 2018-02-18 NOTE — Evaluation (Signed)
Physical Therapy Evaluation Patient Details Name: [name] MRN: [id number] DOB: 03-25-1923 Today's Date: 02/18/2018   History of Present Illness  [name] is a 85 y.o. male with medical history significant of bladder cancer, hypertension, renal cell carcinoma, GERD who was at home and getting up to go to the bathroom and had a fall, resulting in R femoral neck fracture; Now s/p hemiarthroplasty for fixation, Anterior approach, WBAT  Clinical Impression   Patient is s/p above surgery resulting in functional limitations due to the deficits listed below (see PT Problem List). Independent with RW prior to admission; Presents with decr functional mobility, weakness, decr activity tolerance; Pt and his family very much want to dc home, and it sounds like family is looking into getting more help for him; Currently,  still, recommend SNF stay to maximize independence and safety with mobility prior to dc home; Will monitor progress, and update DC plan as appropriate;  Patient will benefit from skilled PT to increase their independence and safety with mobility to allow discharge to the venue listed below.       Follow Up Recommendations SNF;Supervision/Assistance - 24 hour    Equipment Recommendations  Rolling walker with 5" wheels;3in1 (PT)    Recommendations for Other Services       Precautions / Restrictions Precautions Precautions: Fall Precaution Comments: no hip precautions Restrictions RLE Weight Bearing: Weight bearing as tolerated      Mobility  Bed Mobility Overal bed mobility: Needs Assistance Bed Mobility: Sit to Supine       Sit to supine: Mod assist   General bed mobility comments: Pt showed good initiation of scooting hips back onto bed before lying down; heavy mod assist to help LEs into bed  Transfers Overall transfer level: Needs assistance   Transfers: Sit to/from Stand Sit to Stand: Mod assist         General transfer comment: Mod assist to power up  to stand from low recliner, pulling up on bar on Stedy; Minguard assist to stand from higher seat hieght of STedy  Ambulation/Gait                Stairs            Wheelchair Mobility    Modified Rankin (Stroke Patients Only)       Balance     Sitting balance-Leahy Scale: Fair       Standing balance-Leahy Scale: Poor                               Pertinent Vitals/Pain Pain Assessment: Faces Faces Pain Scale: Hurts even more Pain Location: R hip Pain Descriptors / Indicators: Discomfort;Grimacing;Guarding;Sharp;Cramping Pain Intervention(s): Monitored during session    Home Living Family/patient expects to be discharged to:: Private residence Living Arrangements: Children(daughter and son in [employer]) Available Help at Discharge: Family;Available PRN/intermittently Type of Home: House Home Access: Stairs to enter Entrance Stairs-Rails: None Entrance Stairs-Number of Steps: 2 Home Layout: One level Home Equipment: Walker - 2 wheels;Tub bench;Other (comment)(walking stick)      Prior Function Level of Independence: Independent with assistive device(s)         Comments: uses RW for mobility - but otherwise independent     Hand Dominance   Dominant Hand: Right    Extremity/Trunk Assessment   Upper Extremity Assessment Upper Extremity Assessment: Defer to OT evaluation    Lower Extremity Assessment Lower Extremity Assessment: RLE deficits/detail RLE  Deficits / Details: Grossly decr AROM and strength limited by pain post fracture and surgical fixation RLE Sensation: WNL RLE Coordination: decreased fine motor;decreased gross motor       Communication   Communication: HOH  Cognition Arousal/Alertness: Awake/alert Behavior During Therapy: WFL for tasks assessed/performed Overall Cognitive Status: Within Functional Limits for tasks assessed                                        General Comments General comments  (skin integrity, edema, etc.): Pt's daughter present throughout session    Exercises General Exercises - Lower Extremity Quad Sets: AROM;Right;10 reps Heel Slides: AAROM;Right;5 reps Hip ABduction/ADduction: AAROM;Right;5 reps Mini-Sqauts: AROM;Both;5 reps(in Stedy)   Assessment/Plan    PT Assessment Patient needs continued PT services  PT Problem List Decreased strength;Decreased range of motion;Decreased activity tolerance;Decreased balance;Decreased mobility;Decreased coordination;Decreased cognition;Decreased knowledge of use of DME;Decreased safety awareness;Pain       PT Treatment Interventions DME instruction;Gait training;Functional mobility training;Therapeutic activities;Therapeutic exercise;Balance training;Patient/family education;Cognitive remediation    PT Goals (Current goals can be found in the Care Plan section)  Acute Rehab PT Goals Patient Stated Goal: get better PT Goal Formulation: With patient/family Time For Goal Achievement: 03/04/18 Potential to Achieve Goals: Good    Frequency Min 5X/week   Barriers to discharge Decreased caregiver support Is home alone a few hours per day    Co-evaluation               AM-PAC PT "6 Clicks" Mobility  Outcome Measure Help needed turning from your back to your side while in a flat bed without using bedrails?: A Little Help needed moving from lying on your back to sitting on the side of a flat bed without using bedrails?: A Lot Help needed moving to and from a bed to a chair (including a wheelchair)?: A Lot Help needed standing up from a chair using your arms (e.g., wheelchair or bedside chair)?: A Lot Help needed to walk in hospital room?: A Lot Help needed climbing 3-5 steps with a railing? : A Lot 6 Click Score: 13    End of Session Equipment Utilized During Treatment: Gait belt Activity Tolerance: Patient tolerated treatment well Patient left: in bed;with call bell/phone within reach;with family/visitor  present Nurse Communication: Mobility status PT Visit Diagnosis: Unsteadiness on feet (R26.81);Other abnormalities of gait and mobility (R26.89);Muscle weakness (generalized) (M62.81);Pain Pain - Right/Left: Right Pain - part of body: Hip    Time: 2637-8588 PT Time Calculation (min) (ACUTE ONLY): 19 min   Charges:   PT Evaluation $PT Eval Moderate Complexity: [place], [place]  Acute Rehabilitation Services Pager [phone] Office [phone]   [place]letta [place] 02/18/2018, 4:35 PM

## 2018-02-18 NOTE — NC FL2 (Signed)
[place] MEDICAID FL2 LEVEL OF CARE SCREENING TOOL     IDENTIFICATION  Patient Name: [name] Birthdate: Sep 04, 1923 Sex: male Admission Date (Current Location): 02/16/2018  [facility] and [place] Number:  Herbalist and Address:  The . [facility], [place] [address], [place], [place] [address]      Provider Number: [id number]  Attending Physician Name and Address:  Damita Lack, MD  Relative Name and Phone Number:  Jenny Reichmann (daughter) [phone]    Current Level of Care: Hospital Recommended Level of Care: [place] Prior Approval Number:    Date Approved/Denied:   PASRR Number: (under review)  Discharge Plan: SNF    Current Diagnoses: Patient Active Problem List   Diagnosis Date Noted  . Closed displaced fracture of right femoral neck ([place]) 02/16/2018  . Dementia, senile ([place]) 12/01/2013  . Confusion 11/30/2013  . Macular degeneration 11/30/2013  . HTN (hypertension) 11/30/2013  . Hyperlipemia 11/30/2013  . H/O renal cell cancer 11/30/2013  . CKD (chronic kidney disease) stage 3, GFR 30-59 ml/min (HCC) 11/30/2013  . BLADDER CANCER 10/31/2007  . KIDNEY CANCER 10/31/2007  . ACUT GASTROJEJUN ULCER W/HEMORR W/O MENTION OBST 10/31/2007    Orientation RESPIRATION BLADDER Height & Weight     Self, Place  Normal Continent, External catheter Weight: 77.1 kg Height:  5\' 8"  (172.7 cm)  BEHAVIORAL SYMPTOMS/MOOD NEUROLOGICAL BOWEL NUTRITION STATUS      Continent Diet(see discharge summary)  AMBULATORY STATUS COMMUNICATION OF NEEDS Skin   Extensive Assist Verbally Surgical wounds(right hip closed surgical incision)                       Personal Care Assistance Level of Assistance  Bathing, Feeding, Dressing, Total care Bathing Assistance: Maximum assistance Feeding assistance: Independent Dressing Assistance: Maximum assistance Total Care Assistance: Maximum assistance   Functional Limitations Info  Sight,  Hearing, Speech Sight Info: Adequate Hearing Info: Adequate Speech Info: Adequate    SPECIAL CARE FACTORS FREQUENCY  PT (By licensed PT), OT (By licensed OT)     PT Frequency: min 5x weekly OT Frequency: min 5x weekly            Contractures Contractures Info: Not present    Additional Factors Info  Code Status, Allergies Code Status Info: full Allergies Info: Morphine           Current Medications (02/18/2018):  This is the current hospital active medication list Current Facility-Administered Medications  Medication Dose Route Frequency Provider Last Rate Last Dose  . acetaminophen (TYLENOL) tablet 650 mg  650 mg Oral Q6H PRN Damita Lack, MD   650 mg at 02/16/18 2330   Or  . acetaminophen (TYLENOL) suppository 650 mg  650 mg Rectal Q6H PRN Amin, Ankit Chirag, MD      . alum & mag hydroxide-simeth (MAALOX/MYLANTA) 200-200-20 MG/5ML suspension 30 mL  30 mL Oral Q4H PRN Amin, Ankit Chirag, MD      . dextrose 5 %-0.45 % sodium chloride infusion   Intravenous Continuous Damita Lack, MD 75 mL/hr at 02/16/18 2051    . docusate sodium (COLACE) capsule 100 mg  100 mg Oral BID Amin, Ankit Chirag, MD   100 mg at 02/18/18 1129  . enoxaparin (LOVENOX) injection 30 mg  30 mg Subcutaneous Q24H Amin, Ankit Chirag, MD   30 mg at 02/17/18 2039  . HYDROcodone-acetaminophen (NORCO) 7.5-325 MG per tablet 1-2 tablet  1-2 tablet Oral Q4H PRN Amin, Ankit Chirag,  MD      . HYDROcodone-acetaminophen (NORCO/VICODIN) 5-325 MG per tablet 1-2 tablet  1-2 tablet Oral Q4H PRN Damita Lack, MD   1 tablet at 02/18/18 1129  . magnesium citrate solution 1 Bottle  1 Bottle Oral Once PRN Amin, Ankit Chirag, MD      . menthol-cetylpyridinium (CEPACOL) lozenge 3 mg  1 lozenge Oral PRN Amin, Ankit Chirag, MD       Or  . phenol (CHLORASEPTIC) mouth spray 1 spray  1 spray Mouth/Throat PRN Amin, Ankit Chirag, MD      . methocarbamol (ROBAXIN) tablet 500 mg  500 mg Oral Q6H PRN Amin, Ankit Chirag,  MD       Or  . methocarbamol (ROBAXIN) 500 mg in dextrose 5 % 50 mL IVPB  500 mg Intravenous Q6H PRN Amin, Ankit Chirag, MD      . ondansetron (ZOFRAN) tablet 4 mg  4 mg Oral Q6H PRN Amin, Ankit Chirag, MD       Or  . ondansetron (ZOFRAN) injection 4 mg  4 mg Intravenous Q6H PRN Amin, Ankit Chirag, MD      . pantoprazole (PROTONIX) EC tablet 40 mg  40 mg Oral Daily Amin, Ankit Chirag, MD   40 mg at 02/18/18 1129  . polyethylene glycol (MIRALAX / GLYCOLAX) packet 17 g  17 g Oral Daily PRN Amin, Ankit Chirag, MD      . senna-docusate (Senokot-S) tablet 2 tablet  2 tablet Oral QHS PRN Amin, Ankit Chirag, MD      . sertraline (ZOLOFT) tablet 100 mg  100 mg Oral QHS Amin, Ankit Chirag, MD   100 mg at 02/17/18 2038  . sorbitol 70 % solution 30 mL  30 mL Oral Daily PRN Amin, Ankit Chirag, MD      . traMADol (ULTRAM) tablet 50 mg  50 mg Oral Q6H PRN Damita Lack, MD   50 mg at 02/17/18 2037  . traZODone (DESYREL) tablet 100 mg  100 mg Oral QHS PRN Damita Lack, MD   100 mg at 02/17/18 2037     Discharge Medications: Please see discharge summary for a list of discharge medications.  Relevant Imaging Results:  Relevant Lab Results:   Additional Information SSN: [id number]  [name], LCSW

## 2018-02-18 NOTE — Progress Notes (Signed)
PROGRESS NOTE    [name]  GYJ:[id number] DOB: 1923/10/13 DOA: 02/16/2018 PCP: Clarence Stains, MD   Brief Narrative:  85 year old male with history of bladder cancer, renal cell carcinoma, essential hypertension, GERD came to the hospital after sustaining a fall while getting going to the bathroom.  His x-ray in the ER showed mildly displaced right femoral neck fracture.  Patient underwent ORIF on 1/12 which he tolerated well.   Assessment & Plan:   Principal Problem:   Closed displaced fracture of right femoral neck (HCC) Active Problems:   HTN (hypertension)   Hyperlipemia   CKD (chronic kidney disease) stage 3, GFR 30-59 ml/min (HCC)   Dementia, senile (HCC)  Closed right femoral neck fracture Status post ORIF 02/17/2018 - Postop management per orthopedic, DVT prophylaxis per orthopedic will use Lovenox -Control, bowel regimen, incentive spirometry - PT/OT-pending  Essential hypertension -Not on any medications  GERD -PPI  History of renal cell carcinoma -Follow-up outpatient with oncology and primary care provider.  DVT prophylaxis: Lovenox Code Status: Full code Family Communication: Daughter and son-in-law at bedside Disposition Plan: Maintain inpatient stay until evaluated by PT/OT.  I suspect patient will likely end up requiring rehabilitation.  Consultants:   Orthopedic  Procedures:   ORIF of the right hip 1/12  Antimicrobials:   Perioperative, Ancef   Subjective: Per family patient was confused overnight and has been up most of the night.  He was actually trying to get out of bed as well.  This morning patient appears calm and does not have any complaints at the moment.  Review of Systems Otherwise negative except as per HPI, including: General = no fevers, chills, dizziness, malaise, fatigue HEENT/EYES = negative for pain, redness, loss of vision, double vision, blurred vision, loss of hearing, sore throat, hoarseness, dysphagia Cardiovascular=  negative for chest pain, palpitation, murmurs, lower extremity swelling Respiratory/lungs= negative for shortness of breath, cough, hemoptysis, wheezing, mucus production Gastrointestinal= negative for nausea, vomiting,, abdominal pain, melena, hematemesis Genitourinary= negative for Dysuria, Hematuria, Change in Urinary Frequency MSK = Negative for arthralgia, myalgias, Back Pain, Joint swelling  Neurology= Negative for headache, seizures, numbness, tingling  Psychiatry= Negative for anxiety, depression, suicidal and homocidal ideation Allergy/Immunology= Medication/Food allergy as listed  Skin= Negative for Rash, lesions, ulcers, itching   Objective: Vitals:   02/17/18 1435 02/18/18 0117 02/18/18 0118 02/18/18 0518  BP: 109/67 (!) 82/50 (!) 95/43 114/65  Pulse: 77 76  75  Resp: 18     Temp: (!) 97.5 F (36.4 C)  98.3 F (36.8 C) 97.8 F (36.6 C)  TempSrc: Oral  Axillary Axillary  SpO2: 91% 96%  98%  Weight:      Height:        Intake/Output Summary (Last 24 hours) at 02/18/2018 1116 Last data filed at 02/18/2018 0900 Gross per 24 hour  Intake 525.16 ml  Output 1500 ml  Net -974.84 ml   Filed Weights   02/16/18 1628  Weight: 77.1 kg    Examination:  Constitutional: NAD, calm, comfortable Eyes: PERRL, lids and conjunctivae normal ENMT: Mucous membranes are moist. Posterior pharynx clear of any exudate or lesions.Normal dentition.  Neck: normal, supple, no masses, no thyromegaly Respiratory: Slightly diminished breath sounds at the bases. Cardiovascular: Regular rate and rhythm, no murmurs / rubs / gallops. No extremity edema. 2+ pedal pulses. No carotid bruits.  Abdomen: no tenderness, no masses palpated. No hepatosplenomegaly. Bowel sounds positive.  Musculoskeletal: Limited range of motion of his right lower extremity secondary to the  surgery. Skin: Right hip dressing noted which appears to be clean. Neurologic: CN 2-12 grossly intact. Sensation intact, DTR normal.  Strength 4/5 in all 4.  Psychiatric: Patient is alert awake oriented X2.  Baseline alert awake oriented X3.  Slightly poor judgment.   Data Reviewed:   CBC: Recent Labs  Lab 02/16/18 1642 02/16/18 1656 02/18/18 0210  WBC 13.5*  --  11.8*  HGB 14.6 15.3 11.0*  HCT 44.6 45.0 33.3*  MCV 97.0  --  95.1  PLT 171  --  161*   Basic Metabolic Panel: Recent Labs  Lab 02/16/18 1642 02/16/18 1656 02/17/18 0429 02/18/18 0210  NA 139 139 136 134*  K 4.3 4.5 3.8 3.9  CL 103 103 101 103  CO2 25  --  22 23  GLUCOSE 161* 155* 135* 128*  BUN 26* 34* 22 18  CREATININE 1.79* 1.70* 1.70* 1.63*  CALCIUM 9.3  --  8.8* 7.9*   GFR: Estimated Creatinine Clearance: 28 mL/min (A) (by C-G formula based on SCr of 1.63 mg/dL (H)). Liver Function Tests: Recent Labs  Lab 02/16/18 1642 02/17/18 0429  AST 24 29  ALT 20 17  ALKPHOS 80 75  BILITOT 1.0 1.3*  PROT 8.0 7.0  ALBUMIN 3.5 3.1*   No results for input(s): LIPASE, AMYLASE in the last 168 hours. No results for input(s): AMMONIA in the last 168 hours. Coagulation Profile: Recent Labs  Lab 02/16/18 1642  INR 1.09   Cardiac Enzymes: No results for input(s): CKTOTAL, CKMB, CKMBINDEX, TROPONINI in the last 168 hours. BNP (last 3 results) No results for input(s): PROBNP in the last 8760 hours. HbA1C: No results for input(s): HGBA1C in the last 72 hours. CBG: No results for input(s): GLUCAP in the last 168 hours. Lipid Profile: No results for input(s): CHOL, HDL, LDLCALC, TRIG, CHOLHDL, LDLDIRECT in the last 72 hours. Thyroid Function Tests: No results for input(s): TSH, T4TOTAL, FREET4, T3FREE, THYROIDAB in the last 72 hours. Anemia Panel: No results for input(s): VITAMINB12, FOLATE, FERRITIN, TIBC, IRON, RETICCTPCT in the last 72 hours. Sepsis Labs: Recent Labs  Lab 02/16/18 1656  LATICACIDVEN 1.81    Recent Results (from the past 240 hour(s))  MRSA PCR Screening     Status: None   Collection Time: 02/17/18  4:56 AM    Result Value Ref Range Status   MRSA by PCR NEGATIVE NEGATIVE Final    Comment:        The GeneXpert MRSA Assay (FDA approved for NASAL specimens only), is one component of a comprehensive MRSA colonization surveillance program. It is not intended to diagnose MRSA infection nor to guide or monitor treatment for MRSA infections. Performed at [facility], [place] [address] [place], [place] [address]          Radiology Studies: Pelvis Portable  Result Date: 02/17/2018 CLINICAL DATA:  Right total hip arthroplasty anterior approach EXAM: PORTABLE PELVIS 1-2 VIEWS COMPARISON:  Intraoperative right hip radiographs dated 02/17/2018 at 0936 hours FINDINGS: Right hip hemiarthroplasty in satisfactory position. Bilateral hip joint spaces are preserved. Visualized bony pelvis appears intact. Overlying soft tissue gas. IMPRESSION: Right hip hemiarthroplasty in satisfactory position. Electronically Signed   By: Julian Hy M.D.   On: 02/17/2018 20:45   Dg Pelvis Portable  Result Date: 02/16/2018 CLINICAL DATA:  Fall.  Right hip pain. EXAM: PORTABLE PELVIS 1-2 VIEWS COMPARISON:  None. FINDINGS: There is a fracture of the mid right femoral neck. No significant fracture comminution. Fracture is displaced, distal component displacing  superiorly by 1.3 cm. No significant varus or valgus angulation. No other fractures. Skeletal structures are demineralized. No bone lesions. Hip joints, SI joints and symphysis pubis are normally aligned. IMPRESSION: 1. Mildly displaced right femoral neck fracture as described. No other fractures. No dislocation Electronically Signed   By: Lajean Manes M.D.   On: 02/16/2018 17:41   Dg Chest Port 1 View  Result Date: 02/16/2018 CLINICAL DATA:  Fall with hip pain. EXAM: PORTABLE CHEST 1 VIEW COMPARISON:  12/16/2013 FINDINGS: 1652 hours. Low volume film. Cardiopericardial silhouette is at upper limits of normal for size. Interstitial markings are diffusely  coarsened with chronic features. There is pulmonary vascular congestion without overt pulmonary edema. Patchy opacity at the lung bases is similar to prior and likely reflects scarring although pneumonia not excluded. Bones are diffusely demineralized. Telemetry leads overlie the chest. IMPRESSION: 1. Low volume film with borderline cardiomegaly and vascular congestion. 2. Patchy opacity at the lung bases likely reflects scarring although pneumonia not excluded. Electronically Signed   By: Misty Stanley M.D.   On: 02/16/2018 17:40   Dg C-arm 1-60 Min  Result Date: 02/17/2018 CLINICAL DATA:  Right hip hemiarthroplasty EXAM: DG C-ARM 61-120 MIN; OPERATIVE RIGHT HIP WITH PELVIS COMPARISON:  02/16/2018 FINDINGS: 2 intraoperative spot fluoro film show interval right hip hemiarthroplasty. No evidence for immediate hardware complication. IMPRESSION: Status post right hip replacement for femoral neck fracture. Electronically Signed   By: Misty Stanley M.D.   On: 02/17/2018 16:04   Dg Hip Operative Unilat With Pelvis Right  Result Date: 02/17/2018 CLINICAL DATA:  Right hip hemiarthroplasty EXAM: DG C-ARM 61-120 MIN; OPERATIVE RIGHT HIP WITH PELVIS COMPARISON:  02/16/2018 FINDINGS: 2 intraoperative spot fluoro film show interval right hip hemiarthroplasty. No evidence for immediate hardware complication. IMPRESSION: Status post right hip replacement for femoral neck fracture. Electronically Signed   By: Misty Stanley M.D.   On: 02/17/2018 16:04   Dg Femur Portable Min 2 Views Right  Result Date: 02/16/2018 CLINICAL DATA:  Fall today. Lateral right hip pain. Pt denies chest pain and SOB. No previous injury or surgery to the right hip.a EXAM: RIGHT FEMUR PORTABLE 2 VIEW COMPARISON:  None. FINDINGS: Fracture of the femoral neck, mildly displaced, shaft component displacing superiorly by 8-9 mm. No fracture comminution. No other fractures.  Hip and knee joints are normally aligned. IMPRESSION: 1. Right femoral neck  fracture. No other fractures. No dislocation. Electronically Signed   By: Lajean Manes M.D.   On: 02/16/2018 17:40        Scheduled Meds: . docusate sodium  100 mg Oral BID  . enoxaparin (LOVENOX) injection  30 mg Subcutaneous Q24H  . pantoprazole  40 mg Oral Daily  . sertraline  100 mg Oral QHS   Continuous Infusions: . sodium chloride 75 mL/hr at 02/17/18 1334  . dextrose 5 % and 0.45% NaCl 75 mL/hr at 02/16/18 2051  . methocarbamol (ROBAXIN) IV       LOS: 2 days   Time spent= 25 mins     Arsenio Loader, MD [facility]  If 7PM-7AM, please contact night-coverage [email or web address] 02/18/2018, 11:16 AM

## 2018-02-18 NOTE — Consult Note (Signed)
            [facility] CM Primary Care Navigator  02/18/2018  [name] 14-May-1923 [id number]   Seen patient, daughter Jenny Reichmann) and son in-law Konrad Dolores) at the bedside to identify possible discharge needs. Patient was dozing on and off during this visit since he just took his pain medications.    Daughter reports that patient "fell at home when going to the bathroom" which resulted to this admission. (closed displaced fracture of right femoral neck, status post ORIF- open reduction internal fixation)  Patient's daughter endorses Dr. Harlan Stains or Dr. Antony Contras with [facility].   [facility] [address] [facility].   Daughter verbalized that patient has beenmanaging his own medications at homestraight out of the containers.  Patient's son in-law or daughter have been providing transportation to his doctors' appointments.  Patient lives with his daughter and son in-law who serve as his primary caregivers at home.   Anticipated discharge plan isskilled nursing facility as discussed with ortho and inpatient social worker, per daughter (SNF- in process, prefers [facility])  Patient's familyvoiced understanding to call primary care provider's office when hereturnsback home,for a post discharge follow-up appointment within1- 2 weeksor sooner if needs arise.Patient letter (with PCP's contact number) wasprovided as their reminder.  Discussed with patient's familyregarding [facility] CM services available for health management and resourcesat homebut they indicated having no concerns or need for servicesatthis time.Daughter reports that patient had been having good health condition so far.  They expressedunderstandingto seekreferralfrom primary care provider to [facility] care management ifdeemed necessary and appropriatefor anyservices in thefuture-once patient  returnsback home.  [facility] care management information was provided for future needs that patient may have.  Primary care provider's office is listed as providing transition of care (TOC) follow-up.    For additional questions please contact:  Edwena Felty A. Racquel Arkin, BSN, RN-BC [facility] PRIMARY CARE Navigator Cell: [phone]

## 2018-02-18 NOTE — Progress Notes (Signed)
Subjective: 1 Day Post-Op Procedure(s) (LRB): TOTAL HIP ARTHROPLASTY ANTERIOR APPROACH (Right) Patient reports pain as mild.  Feeling well this am.    Objective: Vital signs in last 24 hours: Temp:  [97.5 F (36.4 C)-98.3 F (36.8 C)] 97.8 F (36.6 C) (01/13 0518) Pulse Rate:  [73-85] 75 (01/13 0518) Resp:  [16-25] 18 (01/12 1435) BP: (82-123)/(43-67) 114/65 (01/13 0518) SpO2:  [91 %-98 %] 98 % (01/13 0518)  Intake/Output from previous day: 01/12 0701 - 01/13 0700 In: 505.2 [I.V.:405.2; IV Piggyback:100] Out: 1875 [Urine:1725; Blood:150] Intake/Output this shift: Total I/O In: -  Out: 1200 [Urine:1200]  Recent Labs    02/16/18 1642 02/16/18 1656 02/18/18 0210  HGB 14.6 15.3 11.0*   Recent Labs    02/16/18 1642 02/16/18 1656 02/18/18 0210  WBC 13.5*  --  11.8*  RBC 4.60  --  3.50*  HCT 44.6 45.0 33.3*  PLT 171  --  118*   Recent Labs    02/17/18 0429 02/18/18 0210  NA 136 134*  K 3.8 3.9  CL 101 103  CO2 22 23  BUN 22 18  CREATININE 1.70* 1.63*  GLUCOSE 135* 128*  CALCIUM 8.8* 7.9*   Recent Labs    02/16/18 1642  INR 1.09    Neurologically intact Neurovascular intact Sensation intact distally Intact pulses distally Dorsiflexion/Plantar flexion intact Incision: dressing C/D/I No cellulitis present Compartment soft    Assessment/Plan: 1 Day Post-Op Procedure(s) (LRB): TOTAL HIP ARTHROPLASTY ANTERIOR APPROACH (Right) Up with therapy  WBAT RLE ABLA- mild and stable    Aundra Dubin 02/18/2018, 6:52 AM

## 2018-02-18 NOTE — Progress Notes (Signed)
OT Evaluation:  Clinical Impression: prior to admission, Pt was mod I with RW for mobility. Fixes own meals, enjoys watching TV does own ADL. Does not drive. Pt was max A +2 for sit <>stand and required use of stedy for success. Pt is max to total A for LB dressing/bathing, set up for grooming/feeding,  decreased balance, generalized weakness. Pt will require skilled OT in the acute setting as well as afterwards at the SNF level to maximize safety and independence in ADL and functional transfers. Next session to focus on AE for LB ADL and transfers/bed mobility  Due to medical history and advanced age - a Palliative consult would be prudent for discussion with patient and family as Pt is currently a full code    02/18/18 1200  OT Visit Information  Last OT Received On 02/18/18  Assistance Needed +2  History of Present Illness [name] is a 85 y.o. male with medical history significant of bladder cancer, hypertension, renal cell carcinoma, GERD who was at home and getting up to go to the bathroom and had a fall, resulting in R femoral neck fracture; Now s/p hemiarthroplasty for fixation, Anterior approach, WBAT  Precautions  Precautions Fall  Precaution Comments no hip precautions  Restrictions  Weight Bearing Restrictions Yes  RLE Weight Bearing WBAT  Home Living  Family/patient expects to be discharged to: Private residence  Living Arrangements Children (daughter and son in law)  Available Help at Discharge Family;Available PRN/intermittently  Type of Home House  Home Access Stairs to enter  Entrance Stairs-Number of Steps 2  Entrance Stairs-Rails None  Home Layout One level  Bathroom Shower/Tub Tub/shower unit  Tax adviser - 2 wheels;Tub bench;Other (comment) (walking stick)  Prior Function  Level of Independence Independent with assistive device(s)  Comments uses RW for mobility - but otherwise independent  Communication  Communication  HOH  Pain Assessment  Pain Assessment 0-10  Pain Score 2 (at rest)  Faces Pain Scale 6 (with movement)  Pain Location R hip  Pain Descriptors / Indicators Discomfort;Grimacing;Guarding;Sharp;Cramping  Pain Intervention(s) Limited activity within patient's tolerance;Monitored during session;Repositioned;Patient requesting pain meds-RN notified  Cognition  Arousal/Alertness Awake/alert  Behavior During Therapy Flat affect  Overall Cognitive Status Within Functional Limits for tasks assessed  Upper Extremity Assessment  Upper Extremity Assessment Overall WFL for tasks assessed  Lower Extremity Assessment  Lower Extremity Assessment RLE deficits/detail  RLE Deficits / Details post-op deficits as anticipated  RLE Sensation WNL  RLE Coordination decreased fine motor;decreased gross motor  ADL  Overall ADL's  Needs assistance/impaired  Eating/Feeding Set up;Sitting  Grooming Wash/dry face;Set up;Sitting  Grooming Details (indicate cue type and reason) in recliner  Upper Body Bathing Moderate assistance;Sitting  Lower Body Bathing Maximal assistance  Upper Body Dressing  Minimal assistance  Lower Body Dressing Maximal assistance  Toilet Transfer Maximal assistance;+2 for physical assistance;+2 for safety/equipment;BSC Charlaine Dalton)  Toilet Transfer Details (indicate cue type and reason) simulated through recliner transfer, use of stedy  Toileting- Clothing Manipulation and Hygiene Maximal assistance;Sit to/from stand  General ADL Comments Pt with decreased access to LB for ADL due to pain, decerased activity tolerance, generalized weakness  Vision- History  Baseline Vision/History Wears glasses  Wears Glasses Reading only  Patient Visual Report No change from baseline  Bed Mobility  Overal bed mobility Needs Assistance  Bed Mobility Supine to Sit  Supine to sit Max assist;+2 for physical assistance;+2 for safety/equipment  General bed mobility comments max A to  manage BLE to EOB, heavy  use of bed pad to bring hips EOB, max A for elevation of trunk  Transfers  Overall transfer level Needs assistance  Equipment used Rolling walker (2 wheeled)  Transfer via [place] to/from Stand  Sit to Stand Max assist;+2 physical assistance;+2 safety/equipment;From elevated surface  General transfer comment attempt 1: use of RW, max A +2 with bucket technique with bed pad, unable to achieve full upright. 2nd attempt with stedy - max A +2 but Pt much more successful - able to achieve full upright  Balance  Overall balance assessment Needs assistance  Sitting-balance support Bilateral upper extremity supported;Single extremity supported;Feet supported  Sitting balance-Leahy Scale Poor  Sitting balance - Comments at least min A throughout - poterior lean  Postural control Posterior lean  Standing balance support Bilateral upper extremity supported  Standing balance-Leahy Scale Poor  Standing balance comment requires external assist  General Comments  General comments (skin integrity, edema, etc.) Pt's daughter present throughout session  Exercises  Exercises General Lower Extremity  General Exercises - Lower Extremity  Ankle Circles/Pumps AROM;Both;10 reps  OT - End of Session  Equipment Utilized During Treatment Gait belt;Rolling walker;Oxygen (2L)  Activity Tolerance Patient tolerated treatment well  Patient left in chair;with call bell/phone within reach;with chair alarm set;with family/visitor present  Nurse Communication Mobility status;Precautions;Need for lift equipment;Weight bearing status  OT Assessment  OT Recommendation/Assessment Patient needs continued OT Services  OT Visit Diagnosis Unsteadiness on feet (R26.81);Other abnormalities of gait and mobility (R26.89);Muscle weakness (generalized) (M62.81);History of falling (Z91.81);Pain  Pain - Right/Left Right  Pain - part of body Hip  OT Problem List Decreased range of motion;Decreased activity  tolerance;Impaired balance (sitting and/or standing);Decreased safety awareness;Decreased knowledge of use of DME or AE;Pain  OT Plan  OT Frequency (ACUTE ONLY) Min 2X/week  OT Treatment/Interventions (ACUTE ONLY) Self-care/ADL training;DME and/or AE instruction;Therapeutic activities;Balance training;Patient/family education  AM-PAC OT "6 Clicks" Daily Activity Outcome Measure (Version 2)  Help from another person eating meals? 3  Help from another person taking care of personal grooming? 3  Help from another person toileting, which includes using toliet, bedpan, or urinal? 2  Help from another person bathing (including washing, rinsing, drying)? 2  Help from another person to put on and taking off regular upper body clothing? 3  Help from another person to put on and taking off regular lower body clothing? 1  6 Click Score 14  OT Recommendation  Recommendations for Other Services Other (comment) (Palliative Medicine Consult)  Follow Up Recommendations SNF;Supervision/Assistance - 24 hour  OT Equipment Other (comment) (defer to next venue)  Individuals Consulted  Consulted and Agree with Results and Recommendations Family member/caregiver  Family Member Consulted Daughter  Acute Rehab OT Goals  Patient Stated Goal get better  OT Goal Formulation With patient/family  Time For Goal Achievement 03/04/18  Potential to Achieve Goals Fair  OT Time Calculation  OT Start Time (ACUTE ONLY) 1201  OT Stop Time (ACUTE ONLY) 1236  OT Time Calculation (min) 35 min  OT General Charges  $OT Visit 1 Visit  OT Evaluation  $OT Eval Moderate Complexity 1 Mod  OT Treatments  $Self Care/Home Management  8-22 mins  Written Expression  Dominant Hand Right   Hulda Humphrey OTR/L Acute Rehabilitation Services Pager: [phone] Office: [phone]

## 2018-02-18 NOTE — Clinical Social Work Note (Signed)
Clinical Social Work Assessment  Patient Details  Name: [name] MRN: [id number] Date of Birth: [birth year]  Date of referral:  02/18/18               Reason for consult:  Discharge Planning                Permission sought to share information with:  Case Manager, Facility Sport and exercise psychologist, Family Supports Permission granted to share information::  Yes, Verbal Permission Granted  Name::     Medical sales representative::  SNFs  Relationship::  daughter  Contact Information:  [phone]  Housing/Transportation Living arrangements for the past 2 months:  Single Family Home Source of Information:  Patient Patient Interpreter Needed:  None Criminal Activity/Legal Involvement Pertinent to Current Situation/Hospitalization:  No - Comment as needed Significant Relationships:  Adult Children Lives with:  Self Do you feel safe going back to the place where you live?  No Need for family participation in patient care:  Yes (Comment)  Care giving concerns:  CSW received referral for possible SNF placement at time of discharge. Spoke with patient regarding possibility of SNF placement . Patient's family    is currently unable to care for him at their home given patient's current needs and fall risk.  Patient and  Daughter [name] at bedside  expressed understanding of PT recommendation and are agreeable to SNF placement at time of discharge. CSW to continue to follow and assist with discharge planning needs.     Social Worker assessment / plan:  Spoke with patient and  Daughter [name] at bedside concerning possibility of rehab at [facility] before returning home.    Employment status:  Retired Forensic scientist:  Medicare PT Recommendations:  [place] / Referral to community resources:  [place]  Patient/Family's Response to care:  Patient and daughter [name] at bedside    recognize need for rehab before returning home and are agreeable to a SNF in  [place]. They report preference for [facility]  . CSW explained insurance authorization process. Patient's family reported that they want patient to get stronger to be able to come back home.    Patient/Family's Understanding of and Emotional Response to Diagnosis, Current Treatment, and Prognosis:  Patient/family is realistic regarding therapy needs and expressed being hopeful for SNF placement. Patient expressed understanding of CSW role and discharge process as well as medical condition. No questions/concerns about plan or treatment.    Emotional Assessment Appearance:  Appears stated age Attitude/Demeanor/Rapport:  Gracious Affect (typically observed):  Accepting Orientation:  Oriented to Self, Oriented to Place, Oriented to Situation Alcohol / Substance use:  Not Applicable Psych involvement (Current and /or in the community):  No (Comment)  Discharge Needs  Concerns to be addressed:  Discharge Planning Concerns Readmission within the last 30 days:  No Current discharge risk:  Dependent with Mobility Barriers to Discharge:  Continued Medical Work up   [employer], [place] 02/18/2018, 5:28 PM

## 2018-02-19 ENCOUNTER — Inpatient Hospital Stay (HOSPITAL_COMMUNITY): Payer: Medicare Other

## 2018-02-19 ENCOUNTER — Encounter (HOSPITAL_COMMUNITY): Payer: Self-pay | Admitting: Orthopaedic Surgery

## 2018-02-19 LAB — BASIC METABOLIC PANEL
Anion gap: 10 (ref 5–15)
BUN: 18 mg/dL (ref 8–23)
CO2: 21 mmol/L — ABNORMAL LOW (ref 22–32)
Calcium: 8.1 mg/dL — ABNORMAL LOW (ref 8.9–10.3)
Chloride: 103 mmol/L (ref 98–111)
Creatinine, Ser: 1.63 mg/dL — ABNORMAL HIGH (ref 0.61–1.24)
GFR calc Af Amer: 42 mL/min — ABNORMAL LOW (ref >60)
GFR calc non Af Amer: 36 mL/min — ABNORMAL LOW (ref >60)
Glucose, Bld: 129 mg/dL — ABNORMAL HIGH (ref 70–99)
Potassium: 4.2 mmol/L (ref 3.5–5.1)
Sodium: 134 mmol/L — ABNORMAL LOW (ref 135–145)

## 2018-02-19 LAB — CBC
HEMATOCRIT: 35 % — AB (ref 39.0–52.0)
HEMOGLOBIN: 11.5 g/dL — AB (ref 13.0–17.0)
MCH: 31.5 pg (ref 26.0–34.0)
MCHC: 32.9 g/dL (ref 30.0–36.0)
MCV: 95.9 fL (ref 80.0–100.0)
Platelets: 121 10*3/uL — ABNORMAL LOW (ref 150–400)
RBC: 3.65 MIL/uL — ABNORMAL LOW (ref 4.22–5.81)
RDW: 13.2 % (ref 11.5–15.5)
WBC: 11.9 10*3/uL — AB (ref 4.0–10.5)
nRBC: 0 % (ref 0.0–0.2)

## 2018-02-19 MED ORDER — DEXTROSE-NACL 5-0.45 % IV SOLN
INTRAVENOUS | Status: AC
  Administered 2018-02-19: 14:00:00 via INTRAVENOUS

## 2018-02-19 MED ORDER — FENTANYL CITRATE (PF) 100 MCG/2ML IJ SOLN
25.0000 ug | INTRAMUSCULAR | Status: DC | PRN
  Administered 2018-02-19 – 2018-02-20 (×2): 25 ug via INTRAVENOUS
  Filled 2018-02-19 (×2): qty 2

## 2018-02-19 NOTE — Evaluation (Signed)
Clinical/Bedside Swallow Evaluation Patient Details  Name: [name] MRN: [id number] Date of Birth: 09-13-1923  Today's Date: 02/19/2018 Time: SLP Start Time (ACUTE ONLY): 1235 SLP Stop Time (ACUTE ONLY): 1300 SLP Time Calculation (min) (ACUTE ONLY): 25 min  Past Medical History:  Past Medical History:  Diagnosis Date  . Acid reflux   . Bladder cancer ([place])   . Cancer ([place])   . High cholesterol   . Hypertension   . Renal cancer ([place])   . Stroke [facility])    Past Surgical History:  Past Surgical History:  Procedure Laterality Date  . NEPHRECTOMY     HPI:  [name] is a 85 y.o. male with medical history significant of bladder cancer, hypertension, renal cell carcinoma, GERD who was at home and getting up to go to the bathroom and had a fall, resulting in R femoral neck fracture; Now s/p hemiarthroplasty for fixation, Anterior approach, WBAT. Referred for swallow evaluation; MD note indicates possible pocketing of food, "gurgling sounds." Prior esophagrams, most recent 04/25/13 with flash laryngeal penetration of thin noted with otherwise normal oropharyngeal swallow, tertiary contractions in distal esophagus. CXR 02/19/18 with possible early pneumonia in left base.   Assessment / Plan / Recommendation Clinical Impression   Patient presents with signs of oropharyngeal dysphagia; there is immediate, wet coughing after ice chips, small sip of thin liquids, suggestive of aspiration. Prior to administering POs, SLP used suction to remove food particles in pt's oral cavity and beneath his loose dentures. Mucosa dry and sloughing, appears improved with oral care, but speech is dysarthric even with better mobilization of articulators. Family report this is not his baseline, and have noted speech change since yesterday. He is drowsy, but attends with cues and follows some basic commands. Pt with history of reflux, regurgitation with solids (meats, per family) but otherwise no other reported  dysphagia symptoms prior to this hospitalization. With max A (hand-over hand assist) for feeding spoonful of puree, pt's awareness of bolus improves, and there are no overt signs of aspiration. Pt remained alert but significant difficulties attending to subsequent trials. I suspect pt's mentation is playing a role in his dysphagia, but cannot exclude neurologic deficit. Oral motor examination without obvious asymmetry; difficult to assess strength/ROM does not follow these commands. Recommend he remain NPO with frequent oral care; SLP to follow up next date for PO readiness. Consider imaging to r/o neurologic event as family/PT reporting decline in mentation today.     SLP Visit Diagnosis: Dysphagia, unspecified (R13.10)    Aspiration Risk  Severe aspiration risk    Diet Recommendation NPO   Medication Administration: Via alternative means    Other  Recommendations Oral Care Recommendations: Oral care QID Other Recommendations: Have oral suction available   Follow up Recommendations Other (comment)(tbd)      Frequency and Duration            Prognosis Prognosis for Safe Diet Advancement: Good Barriers to Reach Goals: Cognitive deficits      Swallow Study   General Date of Onset: 02/16/18 HPI: [name] is a 85 y.o. male with medical history significant of bladder cancer, hypertension, renal cell carcinoma, GERD who was at home and getting up to go to the bathroom and had a fall, resulting in R femoral neck fracture; Now s/p hemiarthroplasty for fixation, Anterior approach, WBAT. Referred for swallow evaluation; MD note indicates possible pocketing of food, "gurgling sounds." Prior esophagrams, most recent 04/25/13 with flash laryngeal penetration of thin noted  with otherwise normal oropharyngeal swallow, tertiary contractions in distal esophagus. CXR 02/19/18 with possible early pneumonia in left base. Type of Study: Bedside Swallow Evaluation Previous Swallow Assessment: see  HPI Diet Prior to this Study: NPO Temperature Spikes Noted: No Respiratory Status: Room air History of Recent Intubation: No Behavior/Cognition: Lethargic/Drowsy;Confused;Distractible;Requires cueing Oral Cavity Assessment: Dry;Dried secretions;Other (comment)(food particles, sloughing mucosa) Oral Care Completed by SLP: Yes Oral Cavity - Dentition: Dentures, top;Dentures, bottom Vision: Impaired for self-feeding Self-Feeding Abilities: Total assist Patient Positioning: Upright in chair Baseline Vocal Quality: Low vocal intensity(pt dysarthric; not baseline per family) Volitional Cough: Cognitively unable to elicit Volitional Swallow: Unable to elicit    Oral/Motor/Sensory Function Overall Oral Motor/Sensory Function: Generalized oral weakness(nonfocal; UTA strength/ROM d/t difficulty following commands)   Ice Chips Ice chips: Impaired Presentation: Spoon Oral Phase Impairments: Poor awareness of bolus Pharyngeal Phase Impairments: Suspected delayed Swallow;Wet Vocal Quality;Cough - Immediate   Thin Liquid Thin Liquid: Impaired Presentation: Cup Oral Phase Functional Implications: Prolonged oral transit Pharyngeal  Phase Impairments: Suspected delayed Swallow;Wet Vocal Quality;Cough - Immediate    Nectar Thick Nectar Thick Liquid: Not tested   Honey Thick Honey Thick Liquid: Not tested   Puree Puree: Impaired Oral Phase Impairments: Poor awareness of bolus   Solid     Solid: Not tested     Deneise Lever, MS, CCC-SLP Speech-Language Pathologist Acute Rehabilitation Services Pager: [phone] Office: [phone]  Aliene Altes 02/19/2018,1:13 PM

## 2018-02-19 NOTE — Progress Notes (Signed)
Physical Therapy Treatment Patient Details Name: [name] MRN: [id number] DOB: [birth year] Today's Date: 02/19/2018    History of Present Illness [name] is a 85 y.o. male with medical history significant of bladder cancer, hypertension, renal cell carcinoma, GERD who was at home and getting up to go to the bathroom and had a fall, resulting in R femoral neck fracture; Now s/p hemiarthroplasty for fixation, Anterior approach, WBAT    PT Comments    Continuing work on functional mobility and activity tolerance;  [name] presents today more lethargic, requiring heavier cueing, decr processing, decr initiation compared to yesterday's session; Shivering throughout session; Required +2 assist for sit to stand, and employed stedy to assist him OOB; I agree with SLP and RN observations and appreciate orders for head imaging; I wonder if his decline is due to acute illness in setting of dementia and hip fx, or did he hit his head when he fell? Was the fall witnessed?   Follow Up Recommendations  SNF;Supervision/Assistance - 24 hour     Equipment Recommendations  Rolling walker with 5" wheels;3in1 (PT)    Recommendations for Other Services       Precautions / Restrictions Precautions Precautions: Fall Precaution Comments: no hip precautions Restrictions RLE Weight Bearing: Weight bearing as tolerated    Mobility  Bed Mobility Overal bed mobility: Needs Assistance Bed Mobility: Supine to Sit     Supine to sit: Max assist;+2 for physical assistance;+2 for safety/equipment     General bed mobility comments: max A to manage BLE to EOB, heavy use of bed pad to bring hips EOB, max A for elevation of trunk  Transfers Overall transfer level: Needs assistance   Transfers: Sit to/from Stand Sit to Stand: Mod assist;Max assist         General transfer comment: Max assist of 2 to stand to [facility] from low bed; Heavy mod assist to power upfrom higher Stedy seat; cues to pull up on  bar of Stedy; more difficulty today  Ambulation/Gait                 Stairs             Wheelchair Mobility    Modified Rankin (Stroke Patients Only)       Balance     Sitting balance-Leahy Scale: Fair       Standing balance-Leahy Scale: Poor                              Cognition Arousal/Alertness: Awake/alert Behavior During Therapy: WFL for tasks assessed/performed Overall Cognitive Status: Impaired/Different from baseline Area of Impairment: Attention                               General Comments: More lethargic today, very sleepy, but more aroused once sitting EOB      Exercises General Exercises - Lower Extremity Ankle Circles/Pumps: AROM;Both;5 reps Quad Sets: AROM;Right(a few) Heel Slides: AAROM;Right;5 reps(Limtied by pain)    General Comments General comments (skin integrity, edema, etc.): Slower moving, requiring heavier cues than afternoon session yesterday; noting tremulous movements, like shivering throughout session      Pertinent Vitals/Pain Pain Assessment: Faces Faces Pain Scale: Hurts little more Pain Location: R hip Pain Descriptors / Indicators: Grimacing Pain Intervention(s): Monitored during session    Home Living  Prior Function            PT Goals (current goals can now be found in the care plan section) Acute Rehab PT Goals Patient Stated Goal: Did not state today, but agreeable to OOB PT Goal Formulation: With patient/family Time For Goal Achievement: 03/04/18 Potential to Achieve Goals: Good Progress towards PT goals: Not progressing toward goals - comment(less interactive today, shivering)    Frequency    Min 5X/week      PT Plan Current plan remains appropriate    Co-evaluation              AM-PAC PT "6 Clicks" Mobility   Outcome Measure  Help needed turning from your back to your side while in a flat bed without using bedrails?: A  Lot Help needed moving from lying on your back to sitting on the side of a flat bed without using bedrails?: A Lot Help needed moving to and from a bed to a chair (including a wheelchair)?: A Lot Help needed standing up from a chair using your arms (e.g., wheelchair or bedside chair)?: A Lot Help needed to walk in hospital room?: A Lot Help needed climbing 3-5 steps with a railing? : Total 6 Click Score: 11    End of Session Equipment Utilized During Treatment: Gait belt Activity Tolerance: Patient limited by pain;Patient limited by lethargy Patient left: in chair;with call bell/phone within reach;with chair alarm set Nurse Communication: Mobility status;Need for lift equipment PT Visit Diagnosis: Unsteadiness on feet (R26.81);Other abnormalities of gait and mobility (R26.89);Muscle weakness (generalized) (M62.81);Pain Pain - Right/Left: Right Pain - part of body: Hip     Time: 1139-1200 PT Time Calculation (min) (ACUTE ONLY): 21 min  Charges:  $Therapeutic Activity: 8-22 mins                     Roney Marion, PT  Acute Rehabilitation Services Pager [phone] Office [place] 02/19/2018, 3:18 PM

## 2018-02-19 NOTE — Progress Notes (Signed)
Subjective: 2 Days Post-Op Procedure(s) (LRB): TOTAL HIP ARTHROPLASTY ANTERIOR APPROACH (Right) Patient reports pain as mild.  Doing ok this am.   Objective: Vital signs in last 24 hours: Temp:  [98 F (36.7 C)-98.4 F (36.9 C)] 98.4 F (36.9 C) (01/14 0533) Pulse Rate:  [81-96] 94 (01/14 0533) Resp:  [18] 18 (01/14 0533) BP: (119-147)/(69-75) 119/69 (01/14 0533) SpO2:  [88 %-97 %] 94 % (01/14 0533)  Intake/Output from previous day: 01/13 0701 - 01/14 0700 In: 460 [P.O.:460] Out: 600 [Urine:600] Intake/Output this shift: No intake/output data recorded.  Recent Labs    02/16/18 1642 02/16/18 1656 02/18/18 0210 02/19/18 0408  HGB 14.6 15.3 11.0* 11.5*   Recent Labs    02/18/18 0210 02/19/18 0408  WBC 11.8* 11.9*  RBC 3.50* 3.65*  HCT 33.3* 35.0*  PLT 118* 121*   Recent Labs    02/18/18 0210 02/19/18 0408  NA 134* 134*  K 3.9 4.2  CL 103 103  CO2 23 21*  BUN 18 18  CREATININE 1.63* 1.63*  GLUCOSE 128* 129*  CALCIUM 7.9* 8.1*   Recent Labs    02/16/18 1642  INR 1.09    Neurologically intact Neurovascular intact Sensation intact distally Intact pulses distally Dorsiflexion/Plantar flexion intact Incision: dressing C/D/I No cellulitis present Compartment soft    Assessment/Plan: 2 Days Post-Op Procedure(s) (LRB): TOTAL HIP ARTHROPLASTY ANTERIOR APPROACH (Right) Up with therapy  WBAT RLE ABLA- mild and stable D/C dispo per medicine F/u with Dr. Erlinda Hong 2 weeks post-op for suture removal     [name] 02/19/2018, 7:41 AM

## 2018-02-19 NOTE — Progress Notes (Signed)
In to see pt. Pt noted to have garbled speech, not really able to follow commands. I had asked pt to open his left hand. He was unable to release his grip on the blanket with that hand. MD made aware. New orders received. Pt was seen by SLP. Recommended leaving pt NPO and to do head imaging. Pt is not making a connection between instructions and the action he should take. Continue to monitor

## 2018-02-19 NOTE — Progress Notes (Signed)
PROGRESS NOTE    [name]  YIR:[id number] DOB: Mar 24, 1923 DOA: 02/16/2018 PCP: Harlan Stains, MD   Brief Narrative:  85 year old male with history of bladder cancer, renal cell carcinoma, essential hypertension, GERD came to the hospital after sustaining a fall while getting going to the bathroom.  His x-ray in the ER showed mildly displaced right femoral neck fracture.  Patient underwent ORIF on 1/12 which he tolerated well.  On 1/14 morning there was some concerns of possible dysphagia versus patient pulling food in his mouth therefore speech and swallow was consulted and he was made n.p.o.   Assessment & Plan:   Principal Problem:   Closed displaced fracture of right femoral neck (HCC) Active Problems:   HTN (hypertension)   Hyperlipemia   CKD (chronic kidney disease) stage 3, GFR 30-59 ml/min (HCC)   Dementia, senile (HCC)  Closed right femoral neck fracture Status post ORIF 02/17/2018 - Postop management per orthopedic, DVT prophylaxis per orthopedic will use Lovenox -Control, bowel regimen, incentive spirometry - PT/OT- recommend skilled nursing facility  Dysphagia -Unclear for exact reason, suspect patient is just not swallowing properly.  We will get speech and swallow therapist to see him, may need MBS -Chest x-ray shows left base atelectasis with small pleural effusion.  Advised to use incentive spirometry. -Aspiration precautions  Essential hypertension -Not on any medications  GERD -PPI  History of renal cell carcinoma -Follow-up outpatient with primary care provider and oncology  DVT prophylaxis: Lovenox Code Status: Full code Family Communication: Daughter at bedside Disposition Plan: Maintain inpatient stay until evaluation for dysphagia has been performed  Consultants:   Orthopedic  Procedures:   ORIF of the right hip 1/12  Antimicrobials:   Perioperative, Ancef   Subjective: Patient seen and examined by me this morning.  Patient was given  his pills to swallow but he was not following all the cues.  He was alert and awake.  Denied any complaints. Concern of some " gurgling sounds" coming from his throat area but no evidence of hypoxia or acute shortness of breath.  Review of Systems Otherwise negative except as per HPI, including: General = no fevers, chills, dizziness, malaise, fatigue HEENT/EYES = negative for pain, redness, loss of vision, double vision, blurred vision, loss of hearing Cardiovascular= negative for chest pain, palpitation, murmurs, lower extremity swelling Respiratory/lungs= negative for shortness of breath, cough, hemoptysis, wheezing, mucus production Gastrointestinal= negative for nausea, vomiting,, abdominal pain, melena, hematemesis Genitourinary= negative for Dysuria, Hematuria, Change in Urinary Frequency MSK = Negative for arthralgia, myalgias, Back Pain, Joint swelling  Neurology= Negative for headache, seizures, numbness, tingling  Psychiatry= Negative for anxiety, depression, suicidal and homocidal ideation Allergy/Immunology= Medication/Food allergy as listed  Skin= Negative for Rash, lesions, ulcers, itching    Objective: Vitals:   02/18/18 0518 02/18/18 1149 02/18/18 1944 02/19/18 0533  BP: 114/65 132/75 (!) 147/73 119/69  Pulse: 75 81 96 94  Resp:  18 18 18   Temp: 97.8 F (36.6 C) 98.4 F (36.9 C) 98 F (36.7 C) 98.4 F (36.9 C)  TempSrc: Axillary  Oral Oral  SpO2: 98% 97% (!) 88% 94%  Weight:      Height:        Intake/Output Summary (Last 24 hours) at 02/19/2018 1209 Last data filed at 02/18/2018 1550 Gross per 24 hour  Intake 340 ml  Output 600 ml  Net -260 ml   Filed Weights   02/16/18 1628  Weight: 77.1 kg    Examination:  Constitutional: NAD, calm, comfortable  Eyes: PERRL, lids and conjunctivae normal ENMT: Coarse breath sound in his neck area but no evidence of stridor.  No evidence of hypoxia. Neck: normal, supple, no masses, no thyromegaly Respiratory:  Diminished breath sounds at the bases Cardiovascular: Regular rate and rhythm, no murmurs / rubs / gallops. No extremity edema. 2+ pedal pulses. No carotid bruits.  Abdomen: no tenderness, no masses palpated. No hepatosplenomegaly. Bowel sounds positive.  Musculoskeletal: Limited range of motion of his lower extremity especially his right leg. Skin: Right hip dressing in place Neurologic: CN 2-12 grossly intact. Sensation intact, DTR normal. Strength 5/5 in all 4.  Psychiatric: Alert awake oriented X2   Data Reviewed:   CBC: Recent Labs  Lab 02/16/18 1642 02/16/18 1656 02/18/18 0210 02/19/18 0408  WBC 13.5*  --  11.8* 11.9*  HGB 14.6 15.3 11.0* 11.5*  HCT 44.6 45.0 33.3* 35.0*  MCV 97.0  --  95.1 95.9  PLT 171  --  118* 354*   Basic Metabolic Panel: Recent Labs  Lab 02/16/18 1642 02/16/18 1656 02/17/18 0429 02/18/18 0210 02/19/18 0408  NA 139 139 136 134* 134*  K 4.3 4.5 3.8 3.9 4.2  CL 103 103 101 103 103  CO2 25  --  22 23 21*  GLUCOSE 161* 155* 135* 128* 129*  BUN 26* 34* 22 18 18   CREATININE 1.79* 1.70* 1.70* 1.63* 1.63*  CALCIUM 9.3  --  8.8* 7.9* 8.1*   GFR: Estimated Creatinine Clearance: 28 mL/min (A) (by C-G formula based on SCr of 1.63 mg/dL (H)). Liver Function Tests: Recent Labs  Lab 02/16/18 1642 02/17/18 0429  AST 24 29  ALT 20 17  ALKPHOS 80 75  BILITOT 1.0 1.3*  PROT 8.0 7.0  ALBUMIN 3.5 3.1*   No results for input(s): LIPASE, AMYLASE in the last 168 hours. No results for input(s): AMMONIA in the last 168 hours. Coagulation Profile: Recent Labs  Lab 02/16/18 1642  INR 1.09   Cardiac Enzymes: No results for input(s): CKTOTAL, CKMB, CKMBINDEX, TROPONINI in the last 168 hours. BNP (last 3 results) No results for input(s): PROBNP in the last 8760 hours. HbA1C: No results for input(s): HGBA1C in the last 72 hours. CBG: No results for input(s): GLUCAP in the last 168 hours. Lipid Profile: No results for input(s): CHOL, HDL, LDLCALC,  TRIG, CHOLHDL, LDLDIRECT in the last 72 hours. Thyroid Function Tests: No results for input(s): TSH, T4TOTAL, FREET4, T3FREE, THYROIDAB in the last 72 hours. Anemia Panel: No results for input(s): VITAMINB12, FOLATE, FERRITIN, TIBC, IRON, RETICCTPCT in the last 72 hours. Sepsis Labs: Recent Labs  Lab 02/16/18 1656  LATICACIDVEN 1.81    Recent Results (from the past 240 hour(s))  MRSA PCR Screening     Status: None   Collection Time: 02/17/18  4:56 AM  Result Value Ref Range Status   MRSA by PCR NEGATIVE NEGATIVE Final    Comment:        The GeneXpert MRSA Assay (FDA approved for NASAL specimens only), is one component of a comprehensive MRSA colonization surveillance program. It is not intended to diagnose MRSA infection nor to guide or monitor treatment for MRSA infections. Performed at [facility], [place] [address], [place], [place] [address]          Radiology Studies: Pelvis Portable  Result Date: 02/17/2018 CLINICAL DATA:  Right total hip arthroplasty anterior approach EXAM: PORTABLE PELVIS 1-2 VIEWS COMPARISON:  Intraoperative right hip radiographs dated 02/17/2018 at 0936 hours FINDINGS: Right hip hemiarthroplasty in satisfactory  position. Bilateral hip joint spaces are preserved. Visualized bony pelvis appears intact. Overlying soft tissue gas. IMPRESSION: Right hip hemiarthroplasty in satisfactory position. Electronically Signed   By: Julian Hy M.D.   On: 02/17/2018 20:45   Dg Chest Port 1 View  Result Date: 02/19/2018 CLINICAL DATA:  Shortness of breath and dysphagia. History of bladder carcinoma EXAM: PORTABLE CHEST 1 VIEW COMPARISON:  February 16, 2018 FINDINGS: There is atelectatic change in the left base with small left pleural effusion. The lungs elsewhere are clear. Heart is upper normal in size with pulmonary vascularity normal. There is aortic atherosclerosis. No adenopathy. Bones are osteoporotic. IMPRESSION: Left base atelectasis with small  left pleural effusion. Early pneumonia in this area in the left base can not be excluded. Lungs elsewhere clear. Stable cardiac silhouette. No evident adenopathy. There is aortic atherosclerosis. Bones osteoporotic. Aortic Atherosclerosis (ICD10-I70.0). Electronically Signed   By: Lowella Grip III M.D.   On: 02/19/2018 11:27        Scheduled Meds: . docusate sodium  100 mg Oral BID  . enoxaparin (LOVENOX) injection  30 mg Subcutaneous Q24H  . pantoprazole  40 mg Oral Daily  . sertraline  100 mg Oral QHS   Continuous Infusions: . dextrose 5 % and 0.45% NaCl 75 mL/hr at 02/16/18 2051  . methocarbamol (ROBAXIN) IV       LOS: 3 days   Time spent= 35 mins    [name] Arsenio Loader, MD [facility]  If 7PM-7AM, please contact night-coverage [email or web address] 02/19/2018, 12:09 PM

## 2018-02-20 ENCOUNTER — Inpatient Hospital Stay (HOSPITAL_COMMUNITY): Payer: Medicare Other

## 2018-02-20 DIAGNOSIS — I639 Cerebral infarction, unspecified: Secondary | ICD-10-CM

## 2018-02-20 LAB — HEPATIC FUNCTION PANEL
ALT: 8 U/L (ref 0–44)
AST: 18 U/L (ref 15–41)
Albumin: 2.1 g/dL — ABNORMAL LOW (ref 3.5–5.0)
Alkaline Phosphatase: 72 U/L (ref 38–126)
Bilirubin, Direct: 0.3 mg/dL — ABNORMAL HIGH (ref 0.0–0.2)
Indirect Bilirubin: 0.9 mg/dL (ref 0.3–0.9)
TOTAL PROTEIN: 5.9 g/dL — AB (ref 6.5–8.1)
Total Bilirubin: 1.2 mg/dL (ref 0.3–1.2)

## 2018-02-20 LAB — CBC
HCT: 32.4 % — ABNORMAL LOW (ref 39.0–52.0)
Hemoglobin: 11 g/dL — ABNORMAL LOW (ref 13.0–17.0)
MCH: 32.1 pg (ref 26.0–34.0)
MCHC: 34 g/dL (ref 30.0–36.0)
MCV: 94.5 fL (ref 80.0–100.0)
Platelets: 161 10*3/uL (ref 150–400)
RBC: 3.43 MIL/uL — ABNORMAL LOW (ref 4.22–5.81)
RDW: 13.3 % (ref 11.5–15.5)
WBC: 12.2 10*3/uL — ABNORMAL HIGH (ref 4.0–10.5)
nRBC: 0 % (ref 0.0–0.2)

## 2018-02-20 LAB — AMMONIA: Ammonia: 24 umol/L (ref 9–35)

## 2018-02-20 LAB — TSH: TSH: 1.905 u[IU]/mL (ref 0.350–4.500)

## 2018-02-20 LAB — VITAMIN B12: Vitamin B-12: 498 pg/mL (ref 180–914)

## 2018-02-20 LAB — FOLATE: Folate: 11.6 ng/mL (ref >5.9)

## 2018-02-20 MED ORDER — SODIUM CHLORIDE 0.9 % IV SOLN
INTRAVENOUS | Status: DC
  Administered 2018-02-20 – 2018-02-21 (×2): via INTRAVENOUS

## 2018-02-20 MED ORDER — ORAL CARE MOUTH RINSE
15.0000 mL | Freq: Two times a day (BID) | OROMUCOSAL | Status: DC
  Administered 2018-02-20 – 2018-02-22 (×4): 15 mL via OROMUCOSAL

## 2018-02-20 MED ORDER — CHLORHEXIDINE GLUCONATE 0.12 % MT SOLN
15.0000 mL | Freq: Two times a day (BID) | OROMUCOSAL | Status: DC
  Administered 2018-02-20 – 2018-02-22 (×3): 15 mL via OROMUCOSAL
  Filled 2018-02-20 (×3): qty 15

## 2018-02-20 MED ORDER — RESOURCE THICKENUP CLEAR PO POWD
Freq: Once | ORAL | Status: AC
  Administered 2018-02-20: 18:00:00 via ORAL
  Filled 2018-02-20: qty 125

## 2018-02-20 MED ORDER — DEXTROSE-NACL 5-0.45 % IV SOLN: INTRAVENOUS | Status: DC

## 2018-02-20 NOTE — Care Management Important Message (Signed)
Important Message  Patient Details  Name: [name] MRN: [id number] Date of Birth: Nov 17, 1923   Medicare Important Message Given:  Yes    [name] 02/20/2018, 2:35 PM

## 2018-02-20 NOTE — Progress Notes (Signed)
OT Cancellation Note  Patient Details Name: [name] MRN: [id number] DOB: [birth year]   Cancelled Treatment:     PATIENT WAS NOT SEEN FOR OT SESSION SECONDARY PATIENT WAS AWAITING A MRI AND PATIENT NURSE STATED PATIENT WAS VERY CONFUSED AND RECOMMENDED HOLDING ON THERAPY.   Octavio Matheney 02/20/2018, 1:19 PM

## 2018-02-20 NOTE — Progress Notes (Addendum)
PROGRESS NOTE    [name]  URK:[id number] DOB: 12-18-1923 DOA: 02/16/2018 PCP: Harlan Stains, MD     Brief Narrative:  [name] is a 85 year old male with history of bladder cancer, renal cell carcinoma, essential hypertension, GERD came to the hospital after sustaining a fall while getting going to the bathroom.  His x-ray in the ER showed mildly displaced right femoral neck fracture.  Patient underwent ORIF on 1/12 which he tolerated well.  On 1/14 morning there was some concerns of possible dysphagia versus patient pulling food in his mouth therefore speech and swallow was consulted and he was made n.p.o. He has also had some episodes of confusion as well.   New events last 24 hours / Subjective: Patient seen with family including daughter at bedside. At baseline, patient has some forgetfulness but is typically conversant, and is not dependent on activities of daily living.  Yesterday, he became very confused and lethargic.  CT head was obtained which revealed chronic microvascular ischemia without any acute abnormalities.  Due to concerns for dysphagia and aspiration, he was made n.p.o. and speech evaluated.  This morning, he is much more alert, oriented to self and place but not to situation.  Per family, his mentation has not yet at baseline.   Assessment & Plan:   Principal Problem:   Closed displaced fracture of right femoral neck (HCC) Active Problems:   HTN (hypertension)   Hyperlipemia   CKD (chronic kidney disease) stage 3, GFR 30-59 ml/min (HCC)   Dementia, senile (HCC)   Closed right femoral neck fracture status post ORIF 02/17/2018 -Postop management per orthopedic, DVT prophylaxis per orthopedic will use Lovenox, follow up with Dr. Erlinda Hong in 2 weeks for post-op suture removal  -PT/OT recommend skilled nursing facility  Acute encephalopathy -Unclear etiology, CT head with diffuse atrophy and moderately severe small vessle ischemia without acute intracranial abnormality.  Slightly improved today but not yet to baseline. Obtain MRI head, check labs including TSH, ammonia, B1, B12, folate, LFT   Dysphagia -Currently n.p.o., speech language pathology to reevaluate now that he is much more alert  New onset A Fib -EKG reviewed personally which shows A Fib. Per family, patient does not have previous hx of A Fib   AKI versus CKD stage 3  -Last known baseline creatinine was 1.35 4 years ago. During this hospitalization, his creatinine has ranged from 1.63-1.79.  This is likely his new baseline.  Continue to monitor BMP  Essential hypertension -Not on any medications  GERD -PPI  History of renal cell carcinoma -Follow-up outpatient with primary care provider and oncology   DVT prophylaxis: Lovenox Code Status: Full Family Communication: At bedside Disposition Plan: Pending MRI result, SLP re-evaluation. SNF when mentation near baseline.    Consultants:   Orthopedic surgery   Procedures:   Prosthetic replacement for femoral neck fracture 1/12  Antimicrobials:  Anti-infectives (From admission, onward)   Start     Dose/Rate Route Frequency Ordered Stop   02/17/18 1045  ceFAZolin (ANCEF) IVPB 2g/100 mL premix     2 g 200 mL/hr over 30 Minutes Intravenous Every 6 hours 02/17/18 1038 02/18/18 0635       Objective: Vitals:   02/19/18 0533 02/19/18 1223 02/19/18 2034 02/20/18 0331  BP: 119/69 (!) 129/57 139/73 (!) 152/78  Pulse: 94 97 (!) 103 68  Resp: 18 20 18 18   Temp: 98.4 F (36.9 C) 98.6 F (37 C) 98.3 F (36.8 C) 98.2 F (36.8 C)  TempSrc: Oral  Oral Oral  SpO2: 94% 95% 96% 97%  Weight:      Height:        Intake/Output Summary (Last 24 hours) at 02/20/2018 1216 Last data filed at 02/20/2018 0300 Gross per 24 hour  Intake 526.78 ml  Output 150 ml  Net 376.78 ml   Filed Weights   02/16/18 1628  Weight: 77.1 kg    Examination:  General exam: Appears calm and comfortable  Respiratory system: Clear to auscultation.  Respiratory effort normal. Cardiovascular system: S1 & S2 heard, RRR. No JVD, murmurs, rubs, gallops or clicks. No pedal edema. Gastrointestinal system: Abdomen is nondistended, soft and nontender. No organomegaly or masses felt. Normal bowel sounds heard. Central nervous system: Alert and oriented x3 but not to situation. No focal deficits Extremities: Symmetric Skin: No rashes, lesions or ulcers Psychiatry: Judgement and insight appear stable but poor, appears confused   Data Reviewed: I have personally reviewed following labs and imaging studies  CBC: Recent Labs  Lab 02/16/18 1642 02/16/18 1656 02/18/18 0210 02/19/18 0408 02/20/18 0127  WBC 13.5*  --  11.8* 11.9* 12.2*  HGB 14.6 15.3 11.0* 11.5* 11.0*  HCT 44.6 45.0 33.3* 35.0* 32.4*  MCV 97.0  --  95.1 95.9 94.5  PLT 171  --  118* 121* 269   Basic Metabolic Panel: Recent Labs  Lab 02/16/18 1642 02/16/18 1656 02/17/18 0429 02/18/18 0210 02/19/18 0408  NA 139 139 136 134* 134*  K 4.3 4.5 3.8 3.9 4.2  CL 103 103 101 103 103  CO2 25  --  22 23 21*  GLUCOSE 161* 155* 135* 128* 129*  BUN 26* 34* 22 18 18   CREATININE 1.79* 1.70* 1.70* 1.63* 1.63*  CALCIUM 9.3  --  8.8* 7.9* 8.1*   GFR: Estimated Creatinine Clearance: 28 mL/min (A) (by C-G formula based on SCr of 1.63 mg/dL (H)). Liver Function Tests: Recent Labs  Lab 02/16/18 1642 02/17/18 0429  AST 24 29  ALT 20 17  ALKPHOS 80 75  BILITOT 1.0 1.3*  PROT 8.0 7.0  ALBUMIN 3.5 3.1*   No results for input(s): LIPASE, AMYLASE in the last 168 hours. No results for input(s): AMMONIA in the last 168 hours. Coagulation Profile: Recent Labs  Lab 02/16/18 1642  INR 1.09   Cardiac Enzymes: No results for input(s): CKTOTAL, CKMB, CKMBINDEX, TROPONINI in the last 168 hours. BNP (last 3 results) No results for input(s): PROBNP in the last 8760 hours. HbA1C: No results for input(s): HGBA1C in the last 72 hours. CBG: No results for input(s): GLUCAP in the last 168  hours. Lipid Profile: No results for input(s): CHOL, HDL, LDLCALC, TRIG, CHOLHDL, LDLDIRECT in the last 72 hours. Thyroid Function Tests: No results for input(s): TSH, T4TOTAL, FREET4, T3FREE, THYROIDAB in the last 72 hours. Anemia Panel: No results for input(s): VITAMINB12, FOLATE, FERRITIN, TIBC, IRON, RETICCTPCT in the last 72 hours. Sepsis Labs: Recent Labs  Lab 02/16/18 1656  LATICACIDVEN 1.81    Recent Results (from the past 240 hour(s))  MRSA PCR Screening     Status: None   Collection Time: 02/17/18  4:56 AM  Result Value Ref Range Status   MRSA by PCR NEGATIVE NEGATIVE Final    Comment:        The GeneXpert MRSA Assay (FDA approved for NASAL specimens only), is one component of a comprehensive MRSA colonization surveillance program. It is not intended to diagnose MRSA infection nor to guide or monitor treatment for MRSA infections. Performed at [facility], [address], [place], [place] [address]        Radiology Studies: Ct Head Wo Contrast  Result Date: 02/19/2018 CLINICAL DATA:  Altered level of consciousness, dementia EXAM: CT HEAD WITHOUT CONTRAST TECHNIQUE: Contiguous axial images were obtained from the base of the skull through the vertex without intravenous contrast. COMPARISON:  CT brain scan of 05/21/2014 FINDINGS: Brain: There is little change in ventricular dilatation and prominent cortical sulci diffusely consistent with diffuse atrophy. The septum is midline in position. Moderately severe small vessel ischemic change throughout the periventricular white matter is unchanged. No hemorrhage, mass lesion, or acute infarction is seen. Vascular: No vascular abnormality is noted on this unenhanced study. Skull: On bone window images, no calvarial abnormality is noted. Sinuses/Orbits: There is evidence of right maxillary sinus disease with diffuse mucosal thickening throughout the right maxillary sinus and minimal mucosal thickening within the left  maxillary sinus. No air-fluid level is seen. Some mucosal thickening is also present within the right partition of the sphenoid sinus. This may indicate chronic sphenoid sinusitis in view of the prior CT. Other: None. IMPRESSION: 1. Diffuse atrophy and moderately severe small vessel ischemic change throughout the periventricular white matter. No acute intracranial abnormality. 2. Probable chronic sphenoid sinus disease with maxillary sinus disease as well right greater than left. No air-fluid level is seen. Electronically Signed   By: Ivar Drape M.D.   On: 02/19/2018 14:53   Dg Chest Port 1 View  Result Date: 02/19/2018 CLINICAL DATA:  Shortness of breath and dysphagia. History of bladder carcinoma EXAM: PORTABLE CHEST 1 VIEW COMPARISON:  February 16, 2018 FINDINGS: There is atelectatic change in the left base with small left pleural effusion. The lungs elsewhere are clear. Heart is upper normal in size with pulmonary vascularity normal. There is aortic atherosclerosis. No adenopathy. Bones are osteoporotic. IMPRESSION: Left base atelectasis with small left pleural effusion. Early pneumonia in this area in the left base can not be excluded. Lungs elsewhere clear. Stable cardiac silhouette. No evident adenopathy. There is aortic atherosclerosis. Bones osteoporotic. Aortic Atherosclerosis (ICD10-I70.0). Electronically Signed   By: Lowella Grip III M.D.   On: 02/19/2018 11:27      Scheduled Meds: . docusate sodium  100 mg Oral BID  . enoxaparin (LOVENOX) injection  30 mg Subcutaneous Q24H  . pantoprazole  40 mg Oral Daily  . sertraline  100 mg Oral QHS   Continuous Infusions: . dextrose 5 % and 0.45% NaCl 50 mL/hr at 02/20/18 0300  . methocarbamol (ROBAXIN) IV       LOS: 4 days    Time spent: 45 minutes   Dessa Phi, DO [facility] [email or web address] 02/20/2018, 12:16 PM

## 2018-02-20 NOTE — Progress Notes (Signed)
Pt off floor to MRI

## 2018-02-20 NOTE — Progress Notes (Signed)
  Speech Language Pathology Treatment: Dysphagia  Patient Details Name: [name] MRN: [id number] DOB: Oct 02, 1923 Today's Date: 02/20/2018 Time: [id number] SLP Time Calculation (min) (ACUTE ONLY): 33 min  Assessment / Plan / Recommendation Clinical Impression  Pt confused, found with gown pulled off and covers removed, condom cath removed.  Informed RN and cath was replaced.  Pt positioned for optimal eating; provided with oral care and removed thick secretions from tongue and palate.  Pt demonstrated improved attention today - consumed four oz of applesauce with multiple sub-swallows per bolus.  Ice chips and thin liquids continue to elicit consistent belching, intermittent coughing.  Clinical signs were diminished with nectar-thick liquids. The conflation of pt's hx of esophageal dysphagia, current mental status, and new neuro findings, while [place], are contributing to compromised swallow function.  For today, begin a dysphagia 1 diet with nectar thick liquids with caution.  Give meds crushed in puree.  Hold tray if pt is coughing with meal.   SLP will follow for safety, diet progression vs MBS if warranted.  D/W RN.    HPI HPI: [name] is a 85 y.o. male with medical history significant of bladder cancer, hypertension, renal cell carcinoma, GERD who was at home and getting up to go to the bathroom and had a fall, resulting in R femoral neck fracture; Now s/p hemiarthroplasty for fixation, Anterior approach, WBAT. Referred for swallow evaluation; MD note indicates possible pocketing of food, "gurgling sounds." Prior esophagrams, most recent 04/25/13 with flash laryngeal penetration of thin noted with otherwise normal oropharyngeal swallow, tertiary contractions in distal esophagus. CXR 02/19/18 with possible early pneumonia in left base. MRI brain: grouping of acute microembolic infarcts at the right frontoparietal vertex, none larger than 2 mm.       SLP Plan  Continue with current plan of  care       Recommendations  Diet recommendations: Dysphagia 1 (puree);Nectar-thick liquid Liquids provided via: Cup Medication Administration: Crushed with puree Supervision: Staff to assist with self feeding Compensations: Minimize environmental distractions;Slow rate;Small sips/bites                Oral Care Recommendations: Oral care BID Follow up Recommendations: (tbd) SLP Visit Diagnosis: Dysphagia, unspecified (R13.10) Plan: Continue with current plan of care       [place]. Tivis Ringer, [place] Office number [phone] Pager [phone]   Assunta Curtis 02/20/2018, 4:38 PM

## 2018-02-20 NOTE — Consult Note (Addendum)
Neurology Consultation  Reason for Consult: Stroke  Referring Physician: Dr. Maylene Roes  CC: Confusion  History is obtained from: Family and chart  HPI: [name] is a 85 y.o. male with history of stroke, high cholesterol, hypertension, bladder cancer and now new onset A. Fib.  Patient arrived at the hospital after sustaining a fall while going to the bathroom.  X-ray in the ED showed mild displacement of the right femoral neck.  Patient underwent a ORIF on 1/12 and tolerated this well.  Per nursing chart on 02/19/2018 patient was noted to have dysphasia and also some difficulty with speech and confusion.  CT of head was ordered and showed chronic microvascular ischemia.  MRI brain was then obtained showing small grouping of acute microembolic infarcts at the right frontoparietal vertex, none larger than 2 mm.  Neurology was asked to consult patient for stroke  LKW: Unknown at this time tpa given?: no, out of time window  Premorbid modified Rankin scale (mRS): 0 NIH stroke scale of 5  ROS:  Unable to obtain due to altered mental status.   Past Medical History:  Diagnosis Date  . Acid reflux   . Bladder cancer ([place])   . Cancer ([place])   . High cholesterol   . Hypertension   . Renal cancer ([place])   . Stroke [facility])     History reviewed. No pertinent family history.   Social History:   reports that he has quit smoking. He does not have any smokeless tobacco history on file. He reports that he does not drink alcohol or use drugs.  Medications  Current Facility-Administered Medications:  .  0.9 %  sodium chloride infusion, , Intravenous, Continuous, Kerney Elbe, MD .  acetaminophen (TYLENOL) tablet 650 mg, 650 mg, Oral, Q6H PRN, 650 mg at 02/16/18 2330 **OR** acetaminophen (TYLENOL) suppository 650 mg, 650 mg, Rectal, Q6H PRN, Amin, Ankit Chirag, MD .  alum & mag hydroxide-simeth (MAALOX/MYLANTA) 200-200-20 MG/5ML suspension 30 mL, 30 mL, Oral, Q4H PRN, Amin, Ankit Chirag, MD .   docusate sodium (COLACE) capsule 100 mg, 100 mg, Oral, BID, Amin, Ankit Chirag, MD, 100 mg at 02/19/18 0911 .  enoxaparin (LOVENOX) injection 30 mg, 30 mg, Subcutaneous, Q24H, Amin, Ankit Chirag, MD, 30 mg at 02/19/18 2206 .  fentaNYL (SUBLIMAZE) injection 25 mcg, 25 mcg, Intravenous, Q2H PRN, Schorr, Rhetta Mura, NP, 25 mcg at 02/19/18 2313 .  HYDROcodone-acetaminophen (NORCO) 7.5-325 MG per tablet 1-2 tablet, 1-2 tablet, Oral, Q4H PRN, Damita Lack, MD, 2 tablet at 02/18/18 1928 .  HYDROcodone-acetaminophen (NORCO/VICODIN) 5-325 MG per tablet 1-2 tablet, 1-2 tablet, Oral, Q4H PRN, Damita Lack, MD, 1 tablet at 02/18/18 1129 .  magnesium citrate solution 1 Bottle, 1 Bottle, Oral, Once PRN, Amin, Ankit Chirag, MD .  menthol-cetylpyridinium (CEPACOL) lozenge 3 mg, 1 lozenge, Oral, PRN **OR** phenol (CHLORASEPTIC) mouth spray 1 spray, 1 spray, Mouth/Throat, PRN, Amin, Ankit Chirag, MD .  methocarbamol (ROBAXIN) tablet 500 mg, 500 mg, Oral, Q6H PRN, 500 mg at 02/18/18 2138 **OR** methocarbamol (ROBAXIN) 500 mg in dextrose 5 % 50 mL IVPB, 500 mg, Intravenous, Q6H PRN, Amin, Ankit Chirag, MD .  ondansetron (ZOFRAN) tablet 4 mg, 4 mg, Oral, Q6H PRN **OR** ondansetron (ZOFRAN) injection 4 mg, 4 mg, Intravenous, Q6H PRN, Amin, Ankit Chirag, MD .  pantoprazole (PROTONIX) EC tablet 40 mg, 40 mg, Oral, Daily, Amin, Ankit Chirag, MD, 40 mg at 02/19/18 1000 .  polyethylene glycol (MIRALAX / GLYCOLAX) packet 17 g, 17 g, Oral, Daily PRN,  Amin, Ankit Chirag, MD .  senna-docusate (Senokot-S) tablet 2 tablet, 2 tablet, Oral, QHS PRN, Amin, Ankit Chirag, MD .  sertraline (ZOLOFT) tablet 100 mg, 100 mg, Oral, QHS, Amin, Ankit Chirag, MD, 100 mg at 02/18/18 2140 .  sorbitol 70 % solution 30 mL, 30 mL, Oral, Daily PRN, Amin, Ankit Chirag, MD .  traMADol (ULTRAM) tablet 50 mg, 50 mg, Oral, Q6H PRN, Damita Lack, MD, 50 mg at 02/17/18 2037   Exam: Current vital signs: BP (!) 152/78 (BP Location: Right  Arm)   Pulse 68   Temp 98.2 F (36.8 C) (Oral)   Resp 18   Ht 5\' 8"  (1.727 m)   Wt 77.1 kg   SpO2 97%   BMI 25.85 kg/m  Vital signs in last 24 hours: Temp:  [98.2 F (36.8 C)-98.3 F (36.8 C)] 98.2 F (36.8 C) (01/15 0331) Pulse Rate:  [68-103] 68 (01/15 0331) Resp:  [18] 18 (01/15 0331) BP: (139-152)/(73-78) 152/78 (01/15 0331) SpO2:  [96 %-97 %] 97 % (01/15 0331)  Physical Exam  Constitutional: Appears cachectic Psych: Confused Eyes: No scleral injection HENT: No OP obstrucion Head: Normocephalic. Decreased hydration of oral mucosa Cardiovascular: Normal rate and regular rhythm.  Respiratory: Effort normal, non-labored breathing GI: Soft.  No distension. There is no tenderness.  Skin: WDI with an African amount of ecchymotic regions in his arms  Neurological exam: Mental Status: Patient is awake, alert, but not oriented to person, place, month, year, or situation. Has left-right confusion. Patient is not able to give a clear and coherent history. Cranial Nerves: II: Blinks to threat bilaterally III,IV, VI: EOMI without ptosis or diploplia. Pupils are equal, round, and reactive to light.   V: Facial sensation is symmetric to temperature VII: Facial movement is symmetric.  VIII: hearing is intact to voice X: Uvula elevates symmetrically XI: Shoulder shrug is symmetric. XII: tongue is midline  Motor: Moving upper antigravity with the left arm slightly weaker than right arm, withdraws from noxious stimuli to lower extremities but does not raise legs off bed Sensory: Sensation is subjectively symmetric to light touch and temperature in the arms and legs.  Clearly shows difficulty with left and right likely secondary to her cognitive decline Deep Tendon Reflexes: 2+ and symmetric in the biceps and patellae.  Plantars: Toes are downgoing bilaterally.  Cerebellar: FNF shows optic ataxia bilaterally   Labs I have reviewed labs in epic and the results pertinent to this  consultation are:  CBC    Component Value Date/Time   WBC 12.2 (H) 02/20/2018 0127   RBC 3.43 (L) 02/20/2018 0127   HGB 11.0 (L) 02/20/2018 0127   HCT 32.4 (L) 02/20/2018 0127   PLT 161 02/20/2018 0127   MCV 94.5 02/20/2018 0127   MCH 32.1 02/20/2018 0127   MCHC 34.0 02/20/2018 0127   RDW 13.3 02/20/2018 0127   LYMPHSABS 1.1 12/16/2013 0138   MONOABS 0.8 12/16/2013 0138   EOSABS 0.1 12/16/2013 0138   BASOSABS 0.0 12/16/2013 0138    CMP     Component Value Date/Time   NA 134 (L) 02/19/2018 0408   K 4.2 02/19/2018 0408   CL 103 02/19/2018 0408   CO2 21 (L) 02/19/2018 0408   GLUCOSE 129 (H) 02/19/2018 0408   BUN 18 02/19/2018 0408   CREATININE 1.63 (H) 02/19/2018 0408   CALCIUM 8.1 (L) 02/19/2018 0408   PROT 5.9 (L) 02/20/2018 1225   ALBUMIN 2.1 (L) 02/20/2018 1225   AST 18 02/20/2018 1225  ALT 8 02/20/2018 1225   ALKPHOS 72 02/20/2018 1225   BILITOT 1.2 02/20/2018 1225   GFRNONAA 36 (L) 02/19/2018 0408   GFRAA 42 (L) 02/19/2018 0408    Lipid Panel     Component Value Date/Time   CHOL  01/26/2008 1925    177        ATP III CLASSIFICATION:  <200     mg/dL   Desirable  200-239  mg/dL   Borderline High  >=240    mg/dL   High   TRIG 555 (H) 01/26/2008 1925   HDL 26 (L) 01/26/2008 1925   CHOLHDL 6.8 01/26/2008 1925   VLDL UNABLE TO CALCULATE IF TRIGLYCERIDE OVER 400 mg/dL 01/26/2008 1925   LDLCALC  01/26/2008 1925    UNABLE TO CALCULATE IF TRIGLYCERIDE OVER 400 mg/dL        Total Cholesterol/HDL:CHD Risk Coronary Heart Disease Risk Table                     Men   Women  1/2 Average Risk   3.4   3.3     Imaging I have reviewed the images obtained:  CT-scan of the brain-diffuse atrophy and moderate severe small vessel ischemic changes throughout the periventricular white matter with no acute intracranial abnormality.  MRI examination of the brain- small grouping of acute micro embolic infarcts at the right frontal parietal vertex, none larger than 2 mm.  No  swelling or hemorrhage  Etta Quill PA-C [facility] [phone] 02/20/2018, 2:15 PM     Assessment:  85 year old male status post right hip ORIF, now showing confusion and difficulty following commands.   1. MRI reveals a cluster of multiple small acute microembolic infarcts in the right frontoparietal vertex. DDx for etiology includes cardioembolic strokes and fat emboli from recent ORIF.  2. Given the small size of the infarcts, most likely his confusion is secondary to another etiology, such as dehydration on a possible background of decreased neurological reserve in the setting of the prominent cerebral atropy seen on MRI.  3. Optic ataxia bilaterally. Usually this is referable to the parietal lobes. His diffuse cerebral atrophy suggests a possible underlying neurodegenerative etiology for this finding.    Recommendations: # MRA Head  # Carotid ultrasound  # Transthoracic Echo,  # Due to new onset atrial fibrillation would recommend starting patient on anticoagulation if not a fall risk and if surgery is in agreement  # Given his advanced age, benefits of a statin are felt to be significantly outweighed by risks # BP goal: Normotensive as there is unlikely to be a significant penumbra adjacent to the acute infarctions # HBAIC and Lipid profile # Telemetry monitoring # Frequent neuro checks # NPO until passes stroke swallow screen # please page stroke NP  Or  PA  Or MD from 8am -4 pm  as this patient from this time will be  followed by the stroke.   You can look them up on [email or web address]  Password [email or web address]  I have interviewed the patient and family. I have completed the neurological exam, which was documented by Etta Quill, PA. I have formulated the assessment and recommendations.  Electronically signed: Dr. Kerney Elbe

## 2018-02-20 NOTE — Progress Notes (Signed)
Pt returned from MRI °

## 2018-02-20 NOTE — Progress Notes (Signed)
Physical Therapy Note    02/20/18 0700   PT - Assessment/Plan  PT Plan Discharge plan needs to be updated;Frequency needs to be updated (Solidly recommending SNF at this time)  PT Frequency (ACUTE ONLY) Min 2X/week (per departmental protocol)    Roney Marion, [place] Pager [phone] Office [phone]

## 2018-02-20 NOTE — Progress Notes (Signed)
PASRR number received:  [id number] [name], [place]

## 2018-02-20 NOTE — Plan of Care (Signed)
  Problem: Clinical Measurements: Goal: Will remain free from infection Outcome: Progressing Goal: Respiratory complications will improve Outcome: Progressing   Problem: Safety: Goal: Ability to remain free from injury will improve Outcome: Progressing   Problem: Activity: Goal: Risk for activity intolerance will decrease Outcome: Not Progressing   Problem: Nutrition: Goal: Adequate nutrition will be maintained Outcome: Not Progressing

## 2018-02-20 NOTE — Progress Notes (Signed)
Pt s dtr in room. Had some questions and concerns about pts plan of care. MD made aware. Pt denies pain at this time. More alert this am and oriented to self and place. Continue to monitor.

## 2018-02-21 ENCOUNTER — Inpatient Hospital Stay (HOSPITAL_COMMUNITY): Payer: Medicare Other

## 2018-02-21 DIAGNOSIS — I6340 Cerebral infarction due to embolism of unspecified cerebral artery: Secondary | ICD-10-CM

## 2018-02-21 DIAGNOSIS — I480 Paroxysmal atrial fibrillation: Secondary | ICD-10-CM

## 2018-02-21 DIAGNOSIS — N183 Chronic kidney disease, stage 3 (moderate): Secondary | ICD-10-CM

## 2018-02-21 DIAGNOSIS — I63411 Cerebral infarction due to embolism of right middle cerebral artery: Secondary | ICD-10-CM

## 2018-02-21 DIAGNOSIS — I340 Nonrheumatic mitral (valve) insufficiency: Secondary | ICD-10-CM

## 2018-02-21 DIAGNOSIS — I10 Essential (primary) hypertension: Secondary | ICD-10-CM

## 2018-02-21 DIAGNOSIS — I639 Cerebral infarction, unspecified: Secondary | ICD-10-CM

## 2018-02-21 DIAGNOSIS — E785 Hyperlipidemia, unspecified: Secondary | ICD-10-CM

## 2018-02-21 LAB — BASIC METABOLIC PANEL
Anion gap: 10 (ref 5–15)
BUN: 24 mg/dL — ABNORMAL HIGH (ref 8–23)
CALCIUM: 8.1 mg/dL — AB (ref 8.9–10.3)
CO2: 23 mmol/L (ref 22–32)
Chloride: 100 mmol/L (ref 98–111)
Creatinine, Ser: 1.32 mg/dL — ABNORMAL HIGH (ref 0.61–1.24)
GFR calc Af Amer: 54 mL/min — ABNORMAL LOW (ref >60)
GFR calc non Af Amer: 47 mL/min — ABNORMAL LOW (ref >60)
Glucose, Bld: 114 mg/dL — ABNORMAL HIGH (ref 70–99)
Potassium: 3.7 mmol/L (ref 3.5–5.1)
Sodium: 133 mmol/L — ABNORMAL LOW (ref 135–145)

## 2018-02-21 LAB — LIPID PANEL
Cholesterol: 147 mg/dL (ref 0–200)
HDL: 15 mg/dL — ABNORMAL LOW (ref >40)
LDL Cholesterol: 94 mg/dL (ref 0–99)
TRIGLYCERIDES: 188 mg/dL — AB (ref <150)
Total CHOL/HDL Ratio: 9.8 RATIO
VLDL: 38 mg/dL (ref 0–40)

## 2018-02-21 LAB — CBC
HCT: 32.4 % — ABNORMAL LOW (ref 39.0–52.0)
Hemoglobin: 11.3 g/dL — ABNORMAL LOW (ref 13.0–17.0)
MCH: 32.5 pg (ref 26.0–34.0)
MCHC: 34.9 g/dL (ref 30.0–36.0)
MCV: 93.1 fL (ref 80.0–100.0)
Platelets: 165 10*3/uL (ref 150–400)
RBC: 3.48 MIL/uL — ABNORMAL LOW (ref 4.22–5.81)
RDW: 13.4 % (ref 11.5–15.5)
WBC: 8.6 10*3/uL (ref 4.0–10.5)
nRBC: 0 % (ref 0.0–0.2)

## 2018-02-21 LAB — ECHOCARDIOGRAM COMPLETE
Height: 68 in
Weight: 2720 oz

## 2018-02-21 LAB — HEMOGLOBIN A1C
Hgb A1c MFr Bld: 6.2 % — ABNORMAL HIGH (ref 4.8–5.6)
Mean Plasma Glucose: 131.24 mg/dL

## 2018-02-21 MED ORDER — APIXABAN 2.5 MG PO TABS
2.5000 mg | ORAL_TABLET | Freq: Two times a day (BID) | ORAL | Status: DC
  Administered 2018-02-21 – 2018-02-22 (×2): 2.5 mg via ORAL
  Filled 2018-02-21 (×2): qty 1

## 2018-02-21 MED ORDER — IOPAMIDOL (ISOVUE-370) INJECTION 76%
100.0000 mL | Freq: Once | INTRAVENOUS | Status: AC | PRN
  Administered 2018-02-21: 75 mL via INTRAVENOUS

## 2018-02-21 MED ORDER — PRAVASTATIN SODIUM 10 MG PO TABS
20.0000 mg | ORAL_TABLET | Freq: Every day | ORAL | Status: DC
  Administered 2018-02-21 – 2018-02-22 (×2): 20 mg via ORAL
  Filled 2018-02-21 (×3): qty 2

## 2018-02-21 MED ORDER — METOPROLOL TARTRATE 5 MG/5ML IV SOLN
5.0000 mg | Freq: Once | INTRAVENOUS | Status: AC
  Administered 2018-02-21: 5 mg via INTRAVENOUS
  Filled 2018-02-21: qty 5

## 2018-02-21 NOTE — Progress Notes (Signed)
SLP Cancellation Note  Patient Details Name: [name] MRN: [id number] DOB: 1923-04-08   Cancelled treatment:       Reason Eval/Treat Not Completed: Patient at procedure or test/unavailable;Other (comment)(PT/OT to see pt now. Will continue efforts.)  Celia B. Quentin [facility], CCC-SLP Speech Language Pathologist [phone]  Shonna Chock 02/21/2018, 2:38 PM

## 2018-02-21 NOTE — Progress Notes (Signed)
Occupational Therapy Treatment Patient Details Name: [name] MRN: [id number] DOB: 1924/01/31 Today's Date: 02/21/2018    History of present illness [name] is a 85 y.o. male with medical history significant of bladder cancer, hypertension, renal cell carcinoma, GERD who was at home and getting up to go to the bathroom and had a fall, resulting in R femoral neck fracture; Now s/p hemiarthroplasty for fixation, Anterior approach, WBAT. Post op had trouble swallowing and MRI revealed a Small grouping of acute micro embolic infarctions at the right frontoparietal vertex.   OT comments  Pt with new CVA since last seen by OT, left grasp slightly weaker than the right, but functional throughout session. Pt with improved bed mobility and sit<>stand this session requiring mod A +2. Pt required use of STEDY for transfer. Pt easily distracted during session, trouble following visual tracking while in the recliner (attention vs. Visual deficits). OT will continue to follow acutely, and Pt continues to recommend SNF level care  Please consider Palliative consult to discuss code status.   Follow Up Recommendations  SNF;Supervision/Assistance - 24 hour    Equipment Recommendations  Other (comment)(defer to next venue of care)    Recommendations for Other Services Other (comment)(Palliative)    Precautions / Restrictions Precautions Precautions: Fall Precaution Comments: no hip precautions Restrictions Weight Bearing Restrictions: Yes RLE Weight Bearing: Weight bearing as tolerated       Mobility Bed Mobility Overal bed mobility: Needs Assistance Bed Mobility: Supine to Sit     Supine to sit: Mod assist;+2 for physical assistance     General bed mobility comments: Mod A +2 for bed level mobility for LE ant trunk management  Transfers Overall transfer level: Needs assistance Equipment used: 2 person hand held assist;Ambulation equipment used Transfers: Sit to/from Stand Sit to  Stand: Mod assist;Max assist;+2 physical assistance         General transfer comment: sit to stand at bedside x 2; first attempt with [facility] with posterior bias noted - able to maintain upright with Mod A+2 but unable to reduce LE resting on bed; 2nd attempt with STEDY with patient pulling up.    Balance Overall balance assessment: Needs assistance Sitting-balance support: Bilateral upper extremity supported;Feet supported Sitting balance-Leahy Scale: Fair     Standing balance support: Bilateral upper extremity supported;During functional activity Standing balance-Leahy Scale: Poor Standing balance comment: requires external assist                           ADL either performed or assessed with clinical judgement   ADL Overall ADL's : Needs assistance/impaired Eating/Feeding: NPO   Grooming: Wash/dry face;Set up;Sitting Grooming Details (indicate cue type and reason): in recliner                 Toilet Transfer: Moderate assistance;+2 for physical assistance;+2 for safety/equipment(sit <>stand and use of STEDY) Toilet Transfer Details (indicate cue type and reason): use of stedy to perform transfer   Toileting - Clothing Manipulation Details (indicate cue type and reason): Pt currently using catheter       General ADL Comments: Pt with decreased access to LB for ADL due to pain, decerased activity tolerance, generalized weakness     Vision       Perception     Praxis      Cognition Arousal/Alertness: Awake/alert Behavior During Therapy: WFL for tasks assessed/performed Overall Cognitive Status: Within Functional Limits for tasks assessed  General Comments: attentive to task. answers questions appropriately        Exercises     Shoulder Instructions       General Comments family present and start and finish of session - very supportive    Pertinent Vitals/ Pain       Pain Assessment:  Faces Faces Pain Scale: Hurts a little bit Pain Location: R hip Pain Descriptors / Indicators: Grimacing Pain Intervention(s): Monitored during session;Repositioned  Home Living                                          Prior Functioning/Environment              Frequency  Min 2X/week        Progress Toward Goals  OT Goals(current goals can now be found in the care plan section)  Progress towards OT goals: Progressing toward goals  Acute Rehab OT Goals Patient Stated Goal: "Get out of bed and eat" OT Goal Formulation: With patient/family Time For Goal Achievement: 03/04/18 Potential to Achieve Goals: [place] Discharge plan remains appropriate;Frequency remains appropriate    Co-evaluation    PT/OT/SLP Co-Evaluation/Treatment: Yes Reason for Co-Treatment: Complexity of the patient's impairments (multi-system involvement);For patient/therapist safety;To address functional/ADL transfers;Other (comment)(new CVA) PT goals addressed during session: Mobility/safety with mobility;Balance;Strengthening/ROM OT goals addressed during session: ADL's and self-care;Strengthening/ROM      AM-PAC OT "6 Clicks" Daily Activity     Outcome Measure   Help from another person eating meals?: A Little Help from another person taking care of personal grooming?: A Little Help from another person toileting, which includes using toliet, bedpan, or urinal?: A Lot Help from another person bathing (including washing, rinsing, drying)?: A Lot Help from another person to put on and taking off regular upper body clothing?: A Little Help from another person to put on and taking off regular lower body clothing?: Total 6 Click Score: 14    End of Session Equipment Utilized During Treatment: Gait belt;Rolling walker  OT Visit Diagnosis: Unsteadiness on feet (R26.81);Other abnormalities of gait and mobility (R26.89);Muscle weakness (generalized) (M62.81);History of falling  (Z91.81);Pain Pain - Right/Left: Right Pain - part of body: Hip   Activity Tolerance Patient tolerated treatment well   Patient Left in chair;with call bell/phone within reach;with chair alarm set;with family/visitor present   Nurse Communication Mobility status;Precautions;Need for lift equipment;Weight bearing status        Time: 5701-7793 OT Time Calculation (min): 26 min  Charges: OT General Charges $OT Visit: 1 Visit OT Treatments $Self Care/Home Management : 8-22 mins  Hulda Humphrey OTR/L Acute Rehabilitation Services Pager: [phone] Office: [place] 02/21/2018, 4:00 PM

## 2018-02-21 NOTE — Progress Notes (Signed)
Bilateral lower extremity venous duplex has been completed.   Preliminary results in CV Proc.   Abram Sander 02/21/2018 9:26 AM

## 2018-02-21 NOTE — Progress Notes (Addendum)
STROKE TEAM PROGRESS NOTE   INTERVAL HISTORY His daughter is at the bedside.  Patient in bed, sleeping. Arouses easily, but needs constant stimulation to stay awake. NAD.  As discussed with daughter patient had an abdominal aneurysm about 10 years ago. They cauterized the aneurysm to stop the bleeding. Family would like to discuss as a family whether or not patient should be on anticoagulation for a. Fib.  Update: family agreeable to start eliquis. Pharmacy consult in for dosing suggestions. Okay per Dr. Maylene Roes ( primary team) to start.   Vitals:   02/19/18 2034 02/20/18 0331 02/20/18 1950 02/21/18 0324  BP: 139/73 (!) 152/78 (!) 147/84 (!) 148/82  Pulse: (!) 103 68 84 74  Resp: 18 18 18 18   Temp: 98.3 F (36.8 C) 98.2 F (36.8 C) 98.2 F (36.8 C) 98 F (36.7 C)  TempSrc: Oral Oral Oral Oral  SpO2: 96% 97% 95% 91%  Weight:      Height:        CBC:  Recent Labs  Lab 02/20/18 0127 02/21/18 0154  WBC 12.2* 8.6  HGB 11.0* 11.3*  HCT 32.4* 32.4*  MCV 94.5 93.1  PLT 161 580    Basic Metabolic Panel:  Recent Labs  Lab 02/19/18 0408 02/21/18 0154  NA 134* 133*  K 4.2 3.7  CL 103 100  CO2 21* 23  GLUCOSE 129* 114*  BUN 18 24*  CREATININE 1.63* 1.32*  CALCIUM 8.1* 8.1*   Lipid Panel:     Component Value Date/Time   CHOL 147 02/21/2018 0154   TRIG 188 (H) 02/21/2018 0154   HDL 15 (L) 02/21/2018 0154   CHOLHDL 9.8 02/21/2018 0154   VLDL 38 02/21/2018 0154   LDLCALC 94 02/21/2018 0154   HgbA1c:  Lab Results  Component Value Date   HGBA1C 6.2 (H) 02/21/2018   Urine Drug Screen: No results found for: LABOPIA, COCAINSCRNUR, LABBENZ, AMPHETMU, THCU, LABBARB  Alcohol Level     Component Value Date/Time   ETH <10 02/16/2018 1642    IMAGING Ct Head Wo Contrast  Result Date: 02/19/2018 CLINICAL DATA:  Altered level of consciousness, dementia EXAM: CT HEAD WITHOUT CONTRAST TECHNIQUE: Contiguous axial images were obtained from the base of the skull through the vertex  without intravenous contrast. COMPARISON:  CT brain scan of 05/21/2014 FINDINGS: Brain: There is little change in ventricular dilatation and prominent cortical sulci diffusely consistent with diffuse atrophy. The septum is midline in position. Moderately severe small vessel ischemic change throughout the periventricular white matter is unchanged. No hemorrhage, mass lesion, or acute infarction is seen. Vascular: No vascular abnormality is noted on this unenhanced study. Skull: On bone window images, no calvarial abnormality is noted. Sinuses/Orbits: There is evidence of right maxillary sinus disease with diffuse mucosal thickening throughout the right maxillary sinus and minimal mucosal thickening within the left maxillary sinus. No air-fluid level is seen. Some mucosal thickening is also present within the right partition of the sphenoid sinus. This may indicate chronic sphenoid sinusitis in view of the prior CT. Other: None. IMPRESSION: 1. Diffuse atrophy and moderately severe small vessel ischemic change throughout the periventricular white matter. No acute intracranial abnormality. 2. Probable chronic sphenoid sinus disease with maxillary sinus disease as well right greater than left. No air-fluid level is seen. Electronically Signed   By: Ivar Drape M.D.   On: 02/19/2018 14:53   Mr Brain Wo Contrast  Result Date: 02/20/2018 CLINICAL DATA:  Dysphagia.  Unexplained encephalopathy. EXAM: MRI HEAD WITHOUT CONTRAST TECHNIQUE:  Multiplanar, multiecho pulse sequences of the brain and surrounding structures were obtained without intravenous contrast. COMPARISON:  Head CT 02/19/2018.  MRI 11/12/2008. FINDINGS: Brain: Diffusion imaging shows a few punctate acute infarctions at the right frontoparietal vertex, none larger than 2 mm, consistent with a small grouping of micro embolic infarctions. No evidence of hemorrhage associated with those infarctions. Elsewhere, the brain shows chronic ischemic changes affecting  the pons. No focal cerebellar insult. Cerebral hemispheres show advanced chronic small-vessel ischemic changes of the deep and subcortical white matter. No large vessel territory infarction. No mass lesion, hemorrhage, hydrocephalus or extra-axial collection. Vascular: Major vessels at the base of the brain show flow. Skull and upper cervical spine: Negative Sinuses/Orbits: Mucosal inflammatory changes of the right maxillary sinus and the sphenoid sinus. Orbits negative. Other: None IMPRESSION: Small grouping of acute micro embolic infarctions at the right frontoparietal vertex, none larger than 2 mm. No swelling or hemorrhage. Background pattern of advanced chronic small-vessel ischemic changes throughout the brain. Electronically Signed   By: Nelson Chimes M.D.   On: 02/20/2018 12:57   Dg Chest Port 1 View  Result Date: 02/19/2018 CLINICAL DATA:  Shortness of breath and dysphagia. History of bladder carcinoma EXAM: PORTABLE CHEST 1 VIEW COMPARISON:  February 16, 2018 FINDINGS: There is atelectatic change in the left base with small left pleural effusion. The lungs elsewhere are clear. Heart is upper normal in size with pulmonary vascularity normal. There is aortic atherosclerosis. No adenopathy. Bones are osteoporotic. IMPRESSION: Left base atelectasis with small left pleural effusion. Early pneumonia in this area in the left base can not be excluded. Lungs elsewhere clear. Stable cardiac silhouette. No evident adenopathy. There is aortic atherosclerosis. Bones osteoporotic. Aortic Atherosclerosis (ICD10-I70.0). Electronically Signed   By: Lowella Grip III M.D.   On: 02/19/2018 11:27   Vas [place] Lower Extremity Venous (dvt)  Result Date: 02/21/2018  Lower Venous Study Indications: Stroke.  Performing Technologist: Abram Sander RVS  Examination Guidelines: A complete evaluation includes B-mode imaging, spectral Doppler, color Doppler, and power Doppler as needed of all accessible portions of each vessel.  Bilateral testing is considered an integral part of a complete examination. Limited examinations for reoccurring indications may be performed as noted.  Right Venous Findings: +---------+---------------+---------+-----------+----------+-------+          CompressibilityPhasicitySpontaneityPropertiesSummary +---------+---------------+---------+-----------+----------+-------+ CFV      Full           Yes      Yes                          +---------+---------------+---------+-----------+----------+-------+ SFJ      Full                                                 +---------+---------------+---------+-----------+----------+-------+ FV Prox  Full                                                 +---------+---------------+---------+-----------+----------+-------+ FV Mid   Full                                                 +---------+---------------+---------+-----------+----------+-------+  FV DistalFull                                                 +---------+---------------+---------+-----------+----------+-------+ PFV      Full                                                 +---------+---------------+---------+-----------+----------+-------+ POP      Full           Yes      Yes                          +---------+---------------+---------+-----------+----------+-------+ PTV      Full                                                 +---------+---------------+---------+-----------+----------+-------+ PERO     Full                                                 +---------+---------------+---------+-----------+----------+-------+  Left Venous Findings: +---------+---------------+---------+-----------+----------+--------------+          CompressibilityPhasicitySpontaneityPropertiesSummary        +---------+---------------+---------+-----------+----------+--------------+ CFV      Full           Yes      Yes                                  +---------+---------------+---------+-----------+----------+--------------+ SFJ      Full                                                        +---------+---------------+---------+-----------+----------+--------------+ FV Prox  Full                                                        +---------+---------------+---------+-----------+----------+--------------+ FV Mid   Full                                                        +---------+---------------+---------+-----------+----------+--------------+ FV DistalFull                                                        +---------+---------------+---------+-----------+----------+--------------+ PFV      Full                                                        +---------+---------------+---------+-----------+----------+--------------+  POP      Full           Yes      Yes                                 +---------+---------------+---------+-----------+----------+--------------+ PTV      Full                                                        +---------+---------------+---------+-----------+----------+--------------+ PERO                                                  Not visualized +---------+---------------+---------+-----------+----------+--------------+    Summary: Right: There is no evidence of deep vein thrombosis in the lower extremity. No cystic structure found in the popliteal fossa. Left: There is no evidence of deep vein thrombosis in the lower extremity. No cystic structure found in the popliteal fossa.  *See table(s) above for measurements and observations.    Preliminary     PHYSICAL EXAM  Physical Exam  Constitutional: Appears well- nourished Psych: Confused Eyes: No scleral injection HENT: No OP obstrucion,  Head: Normocephalic.  oral mucosa dry Cardiovascular: Normal rate and regular rhythm.  Respiratory: Effort normal, non-labored breathing GI: Soft.  No distension.  There is no tenderness.  Skin: warm and dry. Several bruises noted on bilateral arms. Skin tear on left arm with pressure relief dressing   Neurological exam: Mental Status: Patient is drowsy, alert, oriented to person only. on.   Cranial Nerves: II: Blinks to threat bilaterally III,IV, VI: EOMI without ptosis or diploplia. Pupils are equal, round, and reactive to light.   V: Facial sensation is symmetric to light touch VII: Facial movement is symmetric.  VIII: hearing is intact to voice X: Uvula elevates midline XI: Shoulder shrug is symmetric. XII: tongue is midline  Motor: BUE raised antigravity 5/5. Left hand grip weaker than right.  Was able to move LLE antigravity and wiggle toes.  Sensory: Patient could feel light touch as he would raise whichever limb I touched, but when asked can you feel me touching you he would not respond.  Plantars: Toes are downgoing bilaterally.  Cerebellar: FNF  ataxia bilaterally, unable to perform HKS  ASSESSMENT/PLAN Mr. Kwali Wrinkle Jeffreys is a 85 y.o. male with history of  CVA, HLD, HTN, fall, bladder cancer, and new on set A. Fib ( this admission) presenting with  AMS.   Stroke:  right  Frontoparietal vertex infarct embolic secondary to new onset a. Fib ( not anticoagulated)  CT head 1. Diffuse atrophy and moderately severe small vessel ischemic change throughout the periventricular white matter. No acute intracranial abnormality. 2. Probable chronic sphenoid sinus disease with maxillary sinus disease as well right greater than left. No air-fluid level is seen.  CTA head & neck pending  MRI  Small grouping of acute micro embolic infarctions at the right frontoparietal vertex, none larger than 2 mm. No swelling or hemorrhage. Background pattern of advanced chronic small-vessel ischemic changes  throughout the brai  2D Echo  pending LE doppler:Right: There is no evidence of deep vein thrombosis in the lower  extremity. No cystic structure  found in the popliteal fossa.  Left: There is no evidence of deep vein thrombosis in the lower extremity. No cystic structure found in the popliteal fossa.  LDL 94  HgbA1c 6.4  SCD's and TED Hose and lovenox for VTE prophylaxis Diet Order            DIET - DYS 1 Room service appropriate? Yes; Fluid consistency: Nectar Thick  Diet effective now               No antithrombotic prior to admission, now on No antithrombotic.   Therapy recommendations:  SLP: dysphagia 1 diet PT/ OT : pending   Disposition:  Pending   Hypertension  Stable . Permissive hypertension (OK if < 220/120) but gradually normalize in 5-7 days . Long-term BP goal normotensive  Hyperlipidemia  Home meds:  None ,  LDL 94, goal < 70  Pravastatin 20 mg daily  Continue statin at discharge  Diabetes type II  HgbA1c 6.2, goal < 7.0  Controlled  Other Stroke Risk Factors  Advanced age   Hx stroke/TIA  Other Active Problems  New on set a. Fib- family discussion pending anticoagulation   Hospital day # [place], MSN, NP-C [facility] [phone]  ATTENDING NOTE: I reviewed above note and agree with the assessment and plan. Pt was seen and examined.   85 year old male with history of hypertension, hyperlipidemia, bladder cancer, stroke admitted for status post fall.  Found to have right femoral neck fracture status post ORIF on 02/17/2018.  EKG showed A. fib, which was new to the patient. On 02/19/2018 he was found to have confusion and slurred speech.  CT no acute finding, MRI showed right MCA and MCA/ACA territory 2-3 punctate DWI signal changes concerning for acute infarct.  TSH and ammonia within normal limit.  A1c 6.2 and LDL 94.  LE venous Doppler no DVT bilaterally.  2D echo pending.  CTA head and neck left ICA proximal 60% stenosis, bilateral VA origin stenosis 30 to 50%.  On exam, patient lying in bed, sleepy but easily arousable, orientated to people and place,  not orientated to time.  Moderate dysarthria.  Otherwise no focal neurological deficit. Although the DWI changes on MRI were faint signal in the setting of shine through from T2 hyperintensity with severe white matter ischemic changes, given new onset A. fib, it is reasonable to suspect cardioembolic events. As per family, pt was independent before the fall, no common fall risk at home, we would recommend eliquis for stroke prevention if cleared by orthopedics. Given his age and Cre, will favor pharmacy consult for eliquis dosing. Also put pt on low dose statin - pravastatin 20mg  for LDL 94. Continue stroke risk factor modification.   Neurology will sign off. Please call with questions. Pt will follow up with stroke clinic NP at [facility] in about 4 weeks. Thanks for the consult.   Rosalin Hawking, MD PhD Stroke Neurology 02/21/2018 1:57 PM  I spent  35 minutes in total face-to-face time with the patient, more than 50% of which was spent in counseling and coordination of care, reviewing test results, images and medication, and discussing the diagnosis of stroke, A. fib, encephalopathy, treatment plan and potential prognosis. This patient's care requiresreview of multiple databases, neurological assessment, discussion with family, other specialists and medical decision making of high complexity. I had long discussion with daughter and grandson and granddaughter at bedside, updated pt current condition, treatment plan and potential prognosis. They  expressed understanding and appreciation.         To contact Stroke Continuity provider, please refer to [email or web address] After hours, contact General Neurology

## 2018-02-21 NOTE — Progress Notes (Addendum)
  Speech Language Pathology Treatment: Dysphagia  Patient Details Name: [name] MRN: [id number] DOB: [birth year] Today's Date: 02/21/2018 Time: 9518-8416 SLP Time Calculation (min) (ACUTE ONLY): 20 min  Assessment / Plan / Recommendation Clinical Impression  Pt seen at bedside for assessment of diet tolerance, readiness to advance, and need for objective study. Pt appears to be tolerating current diet (puree/nectar). He is afebrile, and lungs are CTA per MD notes. Pt's daughter present. Oral care was completed with suction. Dentures were placed. Pt accepted trials of thin liquid, puree, and soft solid. Wet breath sounds noted after thin liquid trial. Pt tolerated trials of graham cracker softened in applesauce without obvious oral difficulty or residue, and without overt s/s aspiration. At this time, will advance diet to dys 2 (finely chopped), and continue nectar thick liquids. MBS was discussed with pt/daughter, and will be completed next date to objectively assess swallow function and safety.    HPI HPI: [name] is a 84 y.o. male with medical history significant of bladder cancer, hypertension, renal cell carcinoma, GERD who was at home and getting up to go to the bathroom and had a fall, resulting in R femoral neck fracture; Now s/p hemiarthroplasty for fixation, Anterior approach, WBAT. Referred for swallow evaluation; MD note indicates possible pocketing of food, "gurgling sounds." Prior esophagrams, most recent 04/25/13 with flash laryngeal penetration of thin noted with otherwise normal oropharyngeal swallow, tertiary contractions in distal esophagus. CXR 02/19/18 with possible early pneumonia in left base. MRI brain: grouping of acute microembolic infarcts at the right frontoparietal vertex, none larger than 2 mm.       SLP Plan  MBS       Recommendations  Diet recommendations: Dysphagia 2 (fine chop);Nectar-thick liquid Liquids provided via: Cup;Straw Medication Administration:  Crushed with puree(if crushable) Supervision: Staff to assist with self feeding;Full supervision/cueing for compensatory strategies Compensations: Minimize environmental distractions;Slow rate;Small sips/bites;Lingual sweep for clearance of pocketing Postural Changes and/or Swallow Maneuvers: Seated upright 90 degrees;Upright 30-60 min after meal                Oral Care Recommendations: Oral care BID Follow up Recommendations: (TBD) SLP Visit Diagnosis: Dysphagia, unspecified (R13.10) Plan: MBS       GO               [name] B. Quentin Ore [facility], CCC-SLP Speech Language Pathologist [phone]  Shonna Chock 02/21/2018, 4:25 PM

## 2018-02-21 NOTE — Progress Notes (Signed)
PROGRESS NOTE    [name]  YTK:[id number] DOB: 1923-06-19 DOA: 02/16/2018 PCP: Harlan Stains, MD     Brief Narrative:  [name] is a 85 year old male with history of bladder cancer, renal cell carcinoma, essential hypertension, GERD came to the hospital after sustaining a fall while getting going to the bathroom.  His x-ray in the ER showed mildly displaced right femoral neck fracture.  Patient underwent ORIF on 1/12 which he tolerated well.  On 1/14 morning there was some concerns of possible dysphagia versus patient pulling food in his mouth therefore speech and swallow was consulted and he was made n.p.o. He has also had some episodes of confusion as well. Due to mentation change, he underwent MRI brain which reveal acute right fronto-parietal CVA as well as A Fib on EKG.   New events last 24 hours / Subjective: No new events, continues to be somewhat confused and mentation not at baseline. SLP upgraded diet to dysphagia 1 diet. Daughter and I discussed anticoagulation given new onset A Fib with stroke. We discussed risk of bleeding and benefit of preventing another stroke. She admits patient had a GI bleeding episode >10 years ago.   I personally reviewed records from 2007. Patient was admitted for UGIB in 12/2005 with syncope, duodenal ulcer disease requiring 3 EGD procedures at that time and required 7u pRBC during admission as well as endo clip placement, epinephrine injection. Patient also with hypovolemic shock during that admission as well.   Assessment & Plan:   Principal Problem:   Closed displaced fracture of right femoral neck (HCC) Active Problems:   HTN (hypertension)   Hyperlipemia   CKD (chronic kidney disease) stage 3, GFR 30-59 ml/min (HCC)   Dementia, senile (HCC)   Cerebral embolism with cerebral infarction   Closed right femoral neck fracture status post ORIF 02/17/2018 -Postop management per orthopedic, DVT prophylaxis per orthopedic will use Lovenox, follow  up with Dr. Erlinda Hong in 2 weeks for post-op suture removal  -PT/OT recommend skilled nursing facility   Right frontoparietal CVA -CTA head/neck, echo pending -Negative doppler US for DVT  -Stroke team following  -PT/OT/SLP following   New onset A Fib -CHA2DS2-VASc: 5, high risk  -HASBLED: 3, high risk, estimated rate of major bleeding with 44yr of oral anticoagulation is 4.9-19.6%  -No previous hx per family. ?Anticoagulation. Discussed benefit vs risk. Patient has hx of massive UGIB in 2007. Family will continue to contemplate and let us know regarding decision on anticoagulation   Dysphagia -Currently dysphagia 1 diet   AKI versus CKD stage 3  -Last known baseline creatinine was 1.35 4 years ago. During this hospitalization, his creatinine has ranged from 1.63-1.79.  This is likely his new baseline.  Stable.   Essential hypertension -Not on any medications  GERD -PPI  History of renal cell carcinoma -Follow-up outpatient with primary care provider and oncology   DVT prophylaxis: Lovenox Code Status: Full Family Communication: Daughter at bedside Disposition Plan: Pending further stroke work up, SNF when ready   Consultants:   Orthopedic surgery   Neurology/Stroke team   Procedures:   Prosthetic replacement for femoral neck fracture 1/12  Antimicrobials:  Anti-infectives (From admission, onward)   Start     Dose/Rate Route Frequency Ordered Stop   02/17/18 1045  ceFAZolin (ANCEF) IVPB 2g/100 mL premix     2 g 200 mL/hr over 30 Minutes Intravenous Every 6 hours 02/17/18 1038 02/18/18 0635       Objective: Vitals:   02/19/18  2034 02/20/18 0331 02/20/18 1950 02/21/18 0324  BP: 139/73 (!) 152/78 (!) 147/84 (!) 148/82  Pulse: (!) 103 68 84 74  Resp: 18 18 18 18   Temp: 98.3 F (36.8 C) 98.2 F (36.8 C) 98.2 F (36.8 C) 98 F (36.7 C)  TempSrc: Oral Oral Oral Oral  SpO2: 96% 97% 95% 91%  Weight:      Height:        Intake/Output Summary (Last 24 hours)  at 02/21/2018 1025 Last data filed at 02/21/2018 0700 Gross per 24 hour  Intake 1110 ml  Output 900 ml  Net 210 ml   Filed Weights   02/16/18 1628  Weight: 77.1 kg    Examination: General exam: Appears calm and comfortable  Respiratory system: Clear to auscultation. Respiratory effort normal. Cardiovascular system: S1 & S2 heard, Irreg rhythm. No JVD, murmurs, rubs, gallops or clicks. No pedal edema. Gastrointestinal system: Abdomen is nondistended, soft and nontender. No organomegaly or masses felt. Normal bowel sounds heard. Central nervous system: Alert but not oriented, non focal  Extremities: Symmetric  Skin: No rashes, lesions or ulcers Psychiatry: Judgement and insight appear poor, remains confused    Data Reviewed: I have personally reviewed following labs and imaging studies  CBC: Recent Labs  Lab 02/16/18 1642 02/16/18 1656 02/18/18 0210 02/19/18 0408 02/20/18 0127 02/21/18 0154  WBC 13.5*  --  11.8* 11.9* 12.2* 8.6  HGB 14.6 15.3 11.0* 11.5* 11.0* 11.3*  HCT 44.6 45.0 33.3* 35.0* 32.4* 32.4*  MCV 97.0  --  95.1 95.9 94.5 93.1  PLT 171  --  118* 121* 161 580   Basic Metabolic Panel: Recent Labs  Lab 02/16/18 1642 02/16/18 1656 02/17/18 0429 02/18/18 0210 02/19/18 0408 02/21/18 0154  NA 139 139 136 134* 134* 133*  K 4.3 4.5 3.8 3.9 4.2 3.7  CL 103 103 101 103 103 100  CO2 25  --  22 23 21* 23  GLUCOSE 161* 155* 135* 128* 129* 114*  BUN 26* 34* 22 18 18  24*  CREATININE 1.79* 1.70* 1.70* 1.63* 1.63* 1.32*  CALCIUM 9.3  --  8.8* 7.9* 8.1* 8.1*   GFR: Estimated Creatinine Clearance: 34.5 mL/min (A) (by C-G formula based on SCr of 1.32 mg/dL (H)). Liver Function Tests: Recent Labs  Lab 02/16/18 1642 02/17/18 0429 02/20/18 1225  AST 24 29 18   ALT 20 17 8   ALKPHOS 80 75 72  BILITOT 1.0 1.3* 1.2  PROT 8.0 7.0 5.9*  ALBUMIN 3.5 3.1* 2.1*   No results for input(s): LIPASE, AMYLASE in the last 168 hours. Recent Labs  Lab 02/20/18 1225  AMMONIA  24   Coagulation Profile: Recent Labs  Lab 02/16/18 1642  INR 1.09   Cardiac Enzymes: No results for input(s): CKTOTAL, CKMB, CKMBINDEX, TROPONINI in the last 168 hours. BNP (last 3 results) No results for input(s): PROBNP in the last 8760 hours. HbA1C: Recent Labs    02/21/18 0154  HGBA1C 6.2*   CBG: No results for input(s): GLUCAP in the last 168 hours. Lipid Profile: Recent Labs    02/21/18 0154  CHOL 147  HDL 15*  LDLCALC 94  TRIG 188*  CHOLHDL 9.8   Thyroid Function Tests: Recent Labs    02/20/18 1225  TSH 1.905   Anemia Panel: Recent Labs    02/20/18 1225  VITAMINB12 498  FOLATE 11.6   Sepsis Labs: Recent Labs  Lab 02/16/18 1656  LATICACIDVEN 1.81    Recent Results (from the past 240 hour(s))  MRSA PCR  Screening     Status: None   Collection Time: 02/17/18  4:56 AM  Result Value Ref Range Status   MRSA by PCR NEGATIVE NEGATIVE Final    Comment:        The GeneXpert MRSA Assay (FDA approved for NASAL specimens only), is one component of a comprehensive MRSA colonization surveillance program. It is not intended to diagnose MRSA infection nor to guide or monitor treatment for MRSA infections. Performed at [facility], [place] [address], [place], [place] [address]        Radiology Studies: Ct Head Wo Contrast  Result Date: 02/19/2018 CLINICAL DATA:  Altered level of consciousness, dementia EXAM: CT HEAD WITHOUT CONTRAST TECHNIQUE: Contiguous axial images were obtained from the base of the skull through the vertex without intravenous contrast. COMPARISON:  CT brain scan of 05/21/2014 FINDINGS: Brain: There is little change in ventricular dilatation and prominent cortical sulci diffusely consistent with diffuse atrophy. The septum is midline in position. Moderately severe small vessel ischemic change throughout the periventricular white matter is unchanged. No hemorrhage, mass lesion, or acute infarction is seen. Vascular: No vascular  abnormality is noted on this unenhanced study. Skull: On bone window images, no calvarial abnormality is noted. Sinuses/Orbits: There is evidence of right maxillary sinus disease with diffuse mucosal thickening throughout the right maxillary sinus and minimal mucosal thickening within the left maxillary sinus. No air-fluid level is seen. Some mucosal thickening is also present within the right partition of the sphenoid sinus. This may indicate chronic sphenoid sinusitis in view of the prior CT. Other: None. IMPRESSION: 1. Diffuse atrophy and moderately severe small vessel ischemic change throughout the periventricular white matter. No acute intracranial abnormality. 2. Probable chronic sphenoid sinus disease with maxillary sinus disease as well right greater than left. No air-fluid level is seen. Electronically Signed   By: Ivar Drape M.D.   On: 02/19/2018 14:53   Mr Brain Wo Contrast  Result Date: 02/20/2018 CLINICAL DATA:  Dysphagia.  Unexplained encephalopathy. EXAM: MRI HEAD WITHOUT CONTRAST TECHNIQUE: Multiplanar, multiecho pulse sequences of the brain and surrounding structures were obtained without intravenous contrast. COMPARISON:  Head CT 02/19/2018.  MRI 11/12/2008. FINDINGS: Brain: Diffusion imaging shows a few punctate acute infarctions at the right frontoparietal vertex, none larger than 2 mm, consistent with a small grouping of micro embolic infarctions. No evidence of hemorrhage associated with those infarctions. Elsewhere, the brain shows chronic ischemic changes affecting the pons. No focal cerebellar insult. Cerebral hemispheres show advanced chronic small-vessel ischemic changes of the deep and subcortical white matter. No large vessel territory infarction. No mass lesion, hemorrhage, hydrocephalus or extra-axial collection. Vascular: Major vessels at the base of the brain show flow. Skull and upper cervical spine: Negative Sinuses/Orbits: Mucosal inflammatory changes of the right maxillary  sinus and the sphenoid sinus. Orbits negative. Other: None IMPRESSION: Small grouping of acute micro embolic infarctions at the right frontoparietal vertex, none larger than 2 mm. No swelling or hemorrhage. Background pattern of advanced chronic small-vessel ischemic changes throughout the brain. Electronically Signed   By: Nelson Chimes M.D.   On: 02/20/2018 12:57   Dg Chest Port 1 View  Result Date: 02/19/2018 CLINICAL DATA:  Shortness of breath and dysphagia. History of bladder carcinoma EXAM: PORTABLE CHEST 1 VIEW COMPARISON:  February 16, 2018 FINDINGS: There is atelectatic change in the left base with small left pleural effusion. The lungs elsewhere are clear. Heart is upper normal in size with pulmonary vascularity normal. There is aortic atherosclerosis.  No adenopathy. Bones are osteoporotic. IMPRESSION: Left base atelectasis with small left pleural effusion. Early pneumonia in this area in the left base can not be excluded. Lungs elsewhere clear. Stable cardiac silhouette. No evident adenopathy. There is aortic atherosclerosis. Bones osteoporotic. Aortic Atherosclerosis (ICD10-I70.0). Electronically Signed   By: Lowella Grip III M.D.   On: 02/19/2018 11:27   Vas [place] Lower Extremity Venous (dvt)  Result Date: 02/21/2018  Lower Venous Study Indications: Stroke.  Performing Technologist: Abram Sander RVS  Examination Guidelines: A complete evaluation includes B-mode imaging, spectral Doppler, color Doppler, and power Doppler as needed of all accessible portions of each vessel. Bilateral testing is considered an integral part of a complete examination. Limited examinations for reoccurring indications may be performed as noted.  Right Venous Findings: +---------+---------------+---------+-----------+----------+-------+          CompressibilityPhasicitySpontaneityPropertiesSummary +---------+---------------+---------+-----------+----------+-------+ CFV      Full           Yes      Yes                           +---------+---------------+---------+-----------+----------+-------+ SFJ      Full                                                 +---------+---------------+---------+-----------+----------+-------+ FV Prox  Full                                                 +---------+---------------+---------+-----------+----------+-------+ FV Mid   Full                                                 +---------+---------------+---------+-----------+----------+-------+ FV DistalFull                                                 +---------+---------------+---------+-----------+----------+-------+ PFV      Full                                                 +---------+---------------+---------+-----------+----------+-------+ POP      Full           Yes      Yes                          +---------+---------------+---------+-----------+----------+-------+ PTV      Full                                                 +---------+---------------+---------+-----------+----------+-------+ PERO     Full                                                 +---------+---------------+---------+-----------+----------+-------+  Left Venous Findings: +---------+---------------+---------+-----------+----------+--------------+          CompressibilityPhasicitySpontaneityPropertiesSummary        +---------+---------------+---------+-----------+----------+--------------+ CFV      Full           Yes      Yes                                 +---------+---------------+---------+-----------+----------+--------------+ SFJ      Full                                                        +---------+---------------+---------+-----------+----------+--------------+ FV Prox  Full                                                        +---------+---------------+---------+-----------+----------+--------------+ FV Mid   Full                                                         +---------+---------------+---------+-----------+----------+--------------+ FV DistalFull                                                        +---------+---------------+---------+-----------+----------+--------------+ PFV      Full                                                        +---------+---------------+---------+-----------+----------+--------------+ POP      Full           Yes      Yes                                 +---------+---------------+---------+-----------+----------+--------------+ PTV      Full                                                        +---------+---------------+---------+-----------+----------+--------------+ PERO                                                  Not visualized +---------+---------------+---------+-----------+----------+--------------+    Summary: Right: There is no evidence of deep vein thrombosis in the lower extremity. No cystic structure found in the popliteal fossa. Left: There is no evidence of deep vein thrombosis in the lower extremity. No cystic structure found  in the popliteal fossa.  *See table(s) above for measurements and observations.    Preliminary       Scheduled Meds: . chlorhexidine  15 mL Mouth Rinse BID  . docusate sodium  100 mg Oral BID  . enoxaparin (LOVENOX) injection  30 mg Subcutaneous Q24H  . mouth rinse  15 mL Mouth Rinse q12n4p  . pantoprazole  40 mg Oral Daily  . sertraline  100 mg Oral QHS   Continuous Infusions: . methocarbamol (ROBAXIN) IV       LOS: 5 days    Time spent: 25 minutes   Dessa Phi, DO [facility] [email or web address] 02/21/2018, 10:25 AM

## 2018-02-21 NOTE — Progress Notes (Signed)
ANTICOAGULATION CONSULT NOTE - Initial Consult  Pharmacy Consult for Eliquis Indication: atrial fibrillation  Allergies  Allergen Reactions  . Morphine Other (See Comments)    Makes patient "crazy"     Patient Measurements: Height: 5\' 8"  (172.7 cm) Weight: 170 lb (77.1 kg) IBW/kg (Calculated) : 68.4   Vital Signs: Temp: 98 F (36.7 C) (01/16 0324) Temp Source: Oral (01/16 0324) BP: 148/82 (01/16 0324) Pulse Rate: 74 (01/16 0324)  Labs: Recent Labs    02/19/18 0408 02/20/18 0127 02/21/18 0154  HGB 11.5* 11.0* 11.3*  HCT 35.0* 32.4* 32.4*  PLT 121* 161 165  CREATININE 1.63*  --  1.32*    Estimated Creatinine Clearance: 34.5 mL/min (A) (by C-G formula based on SCr of 1.32 mg/dL (H)).   Medical History: Past Medical History:  Diagnosis Date  . Acid reflux   . Bladder cancer ([place])   . Cancer ([place])   . High cholesterol   . Hypertension   . Renal cancer ([place])   . Stroke [facility])     Medications:  Scheduled:  . chlorhexidine  15 mL Mouth Rinse BID  . docusate sodium  100 mg Oral BID  . enoxaparin (LOVENOX) injection  30 mg Subcutaneous Q24H  . mouth rinse  15 mL Mouth Rinse q12n4p  . pantoprazole  40 mg Oral Daily  . pravastatin  20 mg Oral q1800  . sertraline  100 mg Oral QHS    Assessment: 85 yo male here for rt. Femoral neck fracture s/p fall. Incidentally while admitted, pt diagnosed with right  frontoparietal vertex infarct embolic secondary to new onset a. Fib ( not anticoagulated). -Last known baseline creatinine was 1.35 4 years ago. During this hospitalization, his creatinine has ranged from 1.63-1.79, noted to be his possible new baseline. In addition, patient has hx of massive UGIB in 2007 per hospitalist note.  Goal of Therapy:   Monitor platelets by anticoagulation protocol: Yes   Plan:  Initiate Eliquis 2.5mg  q12hr. Monitor signs/symptoms of bleeding.  Wyline Mood 02/21/2018,1:13 PM

## 2018-02-21 NOTE — Progress Notes (Signed)
Physical Therapy Treatment Patient Details Name: [name] MRN: [id number] DOB: [birth year] Today's Date: 02/21/2018    History of Present Illness [name] is a 85 y.o. male with medical history significant of bladder cancer, hypertension, renal cell carcinoma, GERD who was at home and getting up to go to the bathroom and had a fall, resulting in R femoral neck fracture; Now s/p hemiarthroplasty for fixation, Anterior approach, WBAT. Post op had trouble swallowing and MRI revealed a Small grouping of acute micro embolic infarctions at the right frontoparietal vertex.    PT Comments    Patient seen for mobility progression. Mod A +2 for bed level mobility. Sit to stand at bedside x 2 with first attempt [facility] with good ability to maintain WB throughout B LE with Mod A - attempted to side step at bedside, however patient with difficulty with initiating R LE hip abduction and weight shift to complete. Patient to continue to benefit from skilled short term rehab. PT to continue follow.     Follow Up Recommendations  SNF;Supervision/Assistance - 24 hour     Equipment Recommendations  Rolling walker with 5" wheels;3in1 (PT)    Recommendations for Other Services       Precautions / Restrictions Precautions Precautions: Fall Precaution Comments: no hip precautions Restrictions Weight Bearing Restrictions: Yes RLE Weight Bearing: Weight bearing as tolerated    Mobility  Bed Mobility Overal bed mobility: Needs Assistance Bed Mobility: Supine to Sit     Supine to sit: Mod assist;+2 for physical assistance     General bed mobility comments: Mod A +2 for bed level mobility for LE ant trunk management  Transfers Overall transfer level: Needs assistance Equipment used: 2 person hand held assist;Ambulation equipment used Transfers: Sit to/from Stand Sit to Stand: Mod assist;Max assist;+2 physical assistance         General transfer comment: sit to stand at bedside x 2; first  attempt with [facility] with posterior bias noted - able to maintain upright with Mod A+2 but unable to reduce LE resting on bed; 2nd attempt with [facility] with patient pulling up.  Ambulation/Gait             General Gait Details: attempted to side step at bedside - unable   Stairs             Wheelchair Mobility    Modified Rankin (Stroke Patients Only) Modified Rankin (Stroke Patients Only) Pre-Morbid Rankin Score: No significant disability Modified Rankin: Severe disability     Balance Overall balance assessment: Needs assistance Sitting-balance support: Bilateral upper extremity supported;Feet supported Sitting balance-Leahy Scale: Fair     Standing balance support: Bilateral upper extremity supported;During functional activity Standing balance-Leahy Scale: Poor Standing balance comment: requires external assist                            Cognition Arousal/Alertness: Awake/alert Behavior During Therapy: WFL for tasks assessed/performed Overall Cognitive Status: Within Functional Limits for tasks assessed                                 General Comments: attentive to task. answers questions appropriately      Exercises      General Comments General comments (skin integrity, edema, etc.): family present and start and finish of session - very supportive      Pertinent Vitals/Pain Pain Assessment: No/denies pain  Home Living                      Prior Function            PT Goals (current goals can now be found in the care plan section) Acute Rehab PT Goals Patient Stated Goal: Did not state today, but agreeable to OOB PT Goal Formulation: With patient/family Time For Goal Achievement: 03/04/18 Potential to Achieve Goals: Good Progress towards PT goals: Progressing toward goals    Frequency    Min 2X/week      PT Plan Current plan remains appropriate    Co-evaluation PT/OT/SLP Co-Evaluation/Treatment:  Yes Reason for Co-Treatment: Complexity of the patient's impairments (multi-system involvement);For patient/therapist safety;To address functional/ADL transfers PT goals addressed during session: Mobility/safety with mobility;Balance;Strengthening/ROM        AM-PAC PT "6 Clicks" Mobility   Outcome Measure  Help needed turning from your back to your side while in a flat bed without using bedrails?: A Lot Help needed moving from lying on your back to sitting on the side of a flat bed without using bedrails?: A Lot Help needed moving to and from a bed to a chair (including a wheelchair)?: A Lot Help needed standing up from a chair using your arms (e.g., wheelchair or bedside chair)?: A Lot Help needed to walk in hospital room?: A Lot Help needed climbing 3-5 steps with a railing? : Total 6 Click Score: 11    End of Session Equipment Utilized During Treatment: Gait belt Activity Tolerance: Patient tolerated treatment well Patient left: in chair;with call bell/phone within reach;with chair alarm set;with family/visitor present Nurse Communication: Mobility status PT Visit Diagnosis: Unsteadiness on feet (R26.81);Other abnormalities of gait and mobility (R26.89);Muscle weakness (generalized) (M62.81);Pain Pain - Right/Left: Right Pain - part of body: Hip     Time: 6761-9509 PT Time Calculation (min) (ACUTE ONLY): 29 min  Charges:  $Therapeutic Activity: 8-22 mins                      Lanney Gins, PT, DPT Supplemental Physical Therapist 02/21/18 3:19 PM Pager: [phone] Office: [phone]

## 2018-02-21 NOTE — Progress Notes (Signed)
Paged [facility] on call to notify them that pt continues to be tachy up to the 140's at times even while at rest/sleep. Requested IV Metoprolol. Awaiting response.

## 2018-02-22 ENCOUNTER — Inpatient Hospital Stay (HOSPITAL_COMMUNITY): Payer: Medicare Other

## 2018-02-22 ENCOUNTER — Encounter (HOSPITAL_COMMUNITY): Payer: Self-pay

## 2018-02-22 DIAGNOSIS — F0151 Vascular dementia with behavioral disturbance: Secondary | ICD-10-CM | POA: Diagnosis not present

## 2018-02-22 DIAGNOSIS — R630 Anorexia: Secondary | ICD-10-CM | POA: Diagnosis not present

## 2018-02-22 DIAGNOSIS — K0889 Other specified disorders of teeth and supporting structures: Secondary | ICD-10-CM | POA: Diagnosis not present

## 2018-02-22 DIAGNOSIS — F0390 Unspecified dementia without behavioral disturbance: Secondary | ICD-10-CM | POA: Diagnosis not present

## 2018-02-22 DIAGNOSIS — I69322 Dysarthria following cerebral infarction: Secondary | ICD-10-CM | POA: Diagnosis not present

## 2018-02-22 DIAGNOSIS — D72820 Lymphocytosis (symptomatic): Secondary | ICD-10-CM | POA: Diagnosis not present

## 2018-02-22 DIAGNOSIS — Z79899 Other long term (current) drug therapy: Secondary | ICD-10-CM | POA: Diagnosis not present

## 2018-02-22 DIAGNOSIS — L89151 Pressure ulcer of sacral region, stage 1: Secondary | ICD-10-CM | POA: Diagnosis not present

## 2018-02-22 DIAGNOSIS — F0150 Vascular dementia without behavioral disturbance: Secondary | ICD-10-CM | POA: Diagnosis not present

## 2018-02-22 DIAGNOSIS — I63441 Cerebral infarction due to embolism of right cerebellar artery: Secondary | ICD-10-CM | POA: Diagnosis not present

## 2018-02-22 DIAGNOSIS — I959 Hypotension, unspecified: Secondary | ICD-10-CM | POA: Diagnosis not present

## 2018-02-22 DIAGNOSIS — G459 Transient cerebral ischemic attack, unspecified: Secondary | ICD-10-CM | POA: Diagnosis not present

## 2018-02-22 DIAGNOSIS — S71001A Unspecified open wound, right hip, initial encounter: Secondary | ICD-10-CM | POA: Diagnosis not present

## 2018-02-22 DIAGNOSIS — K148 Other diseases of tongue: Secondary | ICD-10-CM | POA: Diagnosis not present

## 2018-02-22 DIAGNOSIS — I63111 Cerebral infarction due to embolism of right vertebral artery: Secondary | ICD-10-CM | POA: Diagnosis not present

## 2018-02-22 DIAGNOSIS — I69391 Dysphagia following cerebral infarction: Secondary | ICD-10-CM | POA: Diagnosis not present

## 2018-02-22 DIAGNOSIS — M2550 Pain in unspecified joint: Secondary | ICD-10-CM | POA: Diagnosis not present

## 2018-02-22 DIAGNOSIS — N179 Acute kidney failure, unspecified: Secondary | ICD-10-CM | POA: Diagnosis not present

## 2018-02-22 DIAGNOSIS — S7291XD Unspecified fracture of right femur, subsequent encounter for closed fracture with routine healing: Secondary | ICD-10-CM | POA: Diagnosis not present

## 2018-02-22 DIAGNOSIS — R488 Other symbolic dysfunctions: Secondary | ICD-10-CM | POA: Diagnosis not present

## 2018-02-22 DIAGNOSIS — I4891 Unspecified atrial fibrillation: Secondary | ICD-10-CM | POA: Diagnosis not present

## 2018-02-22 DIAGNOSIS — S51011A Laceration without foreign body of right elbow, initial encounter: Secondary | ICD-10-CM | POA: Diagnosis not present

## 2018-02-22 DIAGNOSIS — I480 Paroxysmal atrial fibrillation: Secondary | ICD-10-CM | POA: Diagnosis not present

## 2018-02-22 DIAGNOSIS — J40 Bronchitis, not specified as acute or chronic: Secondary | ICD-10-CM | POA: Diagnosis not present

## 2018-02-22 DIAGNOSIS — S72031A Displaced midcervical fracture of right femur, initial encounter for closed fracture: Secondary | ICD-10-CM | POA: Diagnosis not present

## 2018-02-22 DIAGNOSIS — R279 Unspecified lack of coordination: Secondary | ICD-10-CM | POA: Diagnosis not present

## 2018-02-22 DIAGNOSIS — F419 Anxiety disorder, unspecified: Secondary | ICD-10-CM | POA: Diagnosis not present

## 2018-02-22 DIAGNOSIS — K219 Gastro-esophageal reflux disease without esophagitis: Secondary | ICD-10-CM | POA: Diagnosis not present

## 2018-02-22 DIAGNOSIS — S51802A Unspecified open wound of left forearm, initial encounter: Secondary | ICD-10-CM | POA: Diagnosis not present

## 2018-02-22 DIAGNOSIS — S72001A Fracture of unspecified part of neck of right femur, initial encounter for closed fracture: Secondary | ICD-10-CM | POA: Diagnosis not present

## 2018-02-22 DIAGNOSIS — R634 Abnormal weight loss: Secondary | ICD-10-CM | POA: Diagnosis not present

## 2018-02-22 DIAGNOSIS — I63411 Cerebral infarction due to embolism of right middle cerebral artery: Secondary | ICD-10-CM | POA: Diagnosis not present

## 2018-02-22 DIAGNOSIS — Z9189 Other specified personal risk factors, not elsewhere classified: Secondary | ICD-10-CM | POA: Diagnosis not present

## 2018-02-22 DIAGNOSIS — F329 Major depressive disorder, single episode, unspecified: Secondary | ICD-10-CM | POA: Diagnosis not present

## 2018-02-22 DIAGNOSIS — I10 Essential (primary) hypertension: Secondary | ICD-10-CM | POA: Diagnosis not present

## 2018-02-22 DIAGNOSIS — E785 Hyperlipidemia, unspecified: Secondary | ICD-10-CM | POA: Diagnosis not present

## 2018-02-22 DIAGNOSIS — S51801A Unspecified open wound of right forearm, initial encounter: Secondary | ICD-10-CM | POA: Diagnosis not present

## 2018-02-22 DIAGNOSIS — Z96641 Presence of right artificial hip joint: Secondary | ICD-10-CM | POA: Diagnosis not present

## 2018-02-22 DIAGNOSIS — Z7401 Bed confinement status: Secondary | ICD-10-CM | POA: Diagnosis not present

## 2018-02-22 DIAGNOSIS — R2689 Other abnormalities of gait and mobility: Secondary | ICD-10-CM | POA: Diagnosis not present

## 2018-02-22 DIAGNOSIS — M6281 Muscle weakness (generalized): Secondary | ICD-10-CM | POA: Diagnosis not present

## 2018-02-22 DIAGNOSIS — R1310 Dysphagia, unspecified: Secondary | ICD-10-CM | POA: Diagnosis not present

## 2018-02-22 LAB — BASIC METABOLIC PANEL
Anion gap: 7 (ref 5–15)
BUN: 22 mg/dL (ref 8–23)
CO2: 26 mmol/L (ref 22–32)
Calcium: 8.2 mg/dL — ABNORMAL LOW (ref 8.9–10.3)
Chloride: 100 mmol/L (ref 98–111)
Creatinine, Ser: 1.29 mg/dL — ABNORMAL HIGH (ref 0.61–1.24)
GFR calc Af Amer: 55 mL/min — ABNORMAL LOW (ref >60)
GFR calc non Af Amer: 48 mL/min — ABNORMAL LOW (ref >60)
Glucose, Bld: 110 mg/dL — ABNORMAL HIGH (ref 70–99)
Potassium: 3.6 mmol/L (ref 3.5–5.1)
Sodium: 133 mmol/L — ABNORMAL LOW (ref 135–145)

## 2018-02-22 LAB — CBC
HCT: 34.6 % — ABNORMAL LOW (ref 39.0–52.0)
Hemoglobin: 11.8 g/dL — ABNORMAL LOW (ref 13.0–17.0)
MCH: 32.3 pg (ref 26.0–34.0)
MCHC: 34.1 g/dL (ref 30.0–36.0)
MCV: 94.8 fL (ref 80.0–100.0)
Platelets: 170 10*3/uL (ref 150–400)
RBC: 3.65 MIL/uL — ABNORMAL LOW (ref 4.22–5.81)
RDW: 13.4 % (ref 11.5–15.5)
WBC: 7.1 10*3/uL (ref 4.0–10.5)
nRBC: 0 % (ref 0.0–0.2)

## 2018-02-22 MED ORDER — APIXABAN 5 MG PO TABS: 5.0000 mg | ORAL_TABLET | Freq: Two times a day (BID) | ORAL | 0 refills | Status: AC

## 2018-02-22 MED ORDER — APIXABAN 5 MG PO TABS
5.0000 mg | ORAL_TABLET | Freq: Two times a day (BID) | ORAL | Status: DC
  Administered 2018-02-22: 5 mg via ORAL
  Filled 2018-02-22: qty 1

## 2018-02-22 MED ORDER — PRAVASTATIN SODIUM 20 MG PO TABS: 20.0000 mg | ORAL_TABLET | Freq: Every day | ORAL | 0 refills | Status: AC

## 2018-02-22 NOTE — Progress Notes (Signed)
[facility] will DC to: [facility] Anticipated DC date: 02/22/2018 Family notified: Jenny Reichmann Transport [id number]  Per MD patient ready for DC to [place]. RN, patient, patient's family, and facility notified of DC. Discharge Summary sent to facility. RN given number for report [phone]. DC packet on chart. Ambulance transport requested for patient.  CSW signing off.  [place], [place]

## 2018-02-22 NOTE — Discharge Instructions (Signed)
° ° °  1. Change dressings as needed 2. May shower but keep incisions covered and dry 3. Take lovenox to prevent blood clots 4. Take stool softeners as needed 5. Take pain meds as needed    Information on my medicine - ELIQUIS (apixaban)  This medication education was reviewed with me or my healthcare representative as part of my discharge preparation.  The pharmacist that spoke with me during my hospital stay was:  Brain Hilts, [facility]  Why was Eliquis prescribed for you? Eliquis was prescribed for you to reduce the risk of a blood clot forming that can cause a stroke if you have a medical condition called atrial fibrillation (a type of irregular heartbeat).  What do You need to know about Eliquis ? Take your Eliquis TWICE DAILY - one tablet in the morning and one tablet in the evening with or without food. If you have difficulty swallowing the tablet whole please discuss with your pharmacist how to take the medication safely.  Take Eliquis exactly as prescribed by your doctor and DO NOT stop taking Eliquis without talking to the doctor who prescribed the medication.  Stopping may increase your risk of developing a stroke.  Refill your prescription before you run out.  After discharge, you should have regular check-up appointments with your healthcare provider that is prescribing your Eliquis.  In the future your dose may need to be changed if your kidney function or weight changes by a significant amount or as you get older.  What do you do if you miss a dose? If you miss a dose, take it as soon as you remember on the same day and resume taking twice daily.  Do not take more than one dose of ELIQUIS at the same time to make up a missed dose.  Important Safety Information A possible side effect of Eliquis is bleeding. You should call your healthcare provider right away if you experience any of the following: ? Bleeding from an injury or your nose that does not  stop. ? Unusual colored urine (red or dark brown) or unusual colored stools (red or black). ? Unusual bruising for unknown reasons. ? A serious fall or if you hit your head (even if there is no bleeding).  Some medicines may interact with Eliquis and might increase your risk of bleeding or clotting while on Eliquis. To help avoid this, consult your healthcare provider or pharmacist prior to using any new prescription or non-prescription medications, including herbals, vitamins, non-steroidal anti-inflammatory drugs (NSAIDs) and supplements.  This website has more information on Eliquis (apixaban): http://www.eliquis.com/eliquis/home

## 2018-02-22 NOTE — Plan of Care (Signed)
  Problem: Nutrition: Goal: Adequate nutrition will be maintained Outcome: Progressing   Problem: Clinical Measurements: Goal: Ability to maintain clinical measurements within normal limits will improve Outcome: Progressing Goal: Will remain free from infection Outcome: Progressing Goal: Diagnostic test results will improve Outcome: Progressing Goal: Respiratory complications will improve Outcome: Not Applicable Goal: Cardiovascular complication will be avoided Outcome: Progressing

## 2018-02-22 NOTE — Clinical Social Work Placement (Signed)
   CLINICAL SOCIAL WORK PLACEMENT  NOTE  Date:  02/22/2018  Patient Details  Name: [name] MRN: [id number] Date of Birth: 01/29/1924  Clinical Social Work is seeking post-discharge placement for this patient at the [place] level of care (*CSW will initial, date and re-position this form in  chart as items are completed):      Patient/family provided with [facility] Work Department's list of facilities offering this level of care within the geographic area requested by the patient (or if unable, by the patient's family).  Yes   Patient/family informed of their freedom to choose among providers that offer the needed level of care, that participate in Medicare, Medicaid or managed care program needed by the patient, have an available bed and are willing to accept the patient.      Patient/family informed of [facility]'s ownership interest in [facility] and [facility], as well as of the fact that they are under no obligation to receive care at these facilities.  PASRR submitted to EDS on       PASRR number received on 02/20/18     Existing PASRR number confirmed on       FL2 transmitted to all facilities in geographic area requested by pt/family on 02/19/18     FL2 transmitted to all facilities within larger geographic area on       Patient informed that his/her managed care company has contracts with or will negotiate with certain facilities, including the following:        Yes   Patient/family informed of bed offers received.  Patient chooses bed at [facility]     Physician recommends and patient chooses bed at      Patient to be transferred to [facility] on 02/22/18.  Patient to be transferred to facility by [facility]     Patient family notified on 02/22/18 of transfer.  Name of family member notified:  Cindy     PHYSICIAN       Additional Comment:    _______________________________________________ [name],  LCSW 02/22/2018, 3:40 PM

## 2018-02-22 NOTE — Consult Note (Signed)
Modified Barium Swallow Progress Note  Patient Details  Name: [name] MRN: [id number] Date of Birth: 1923-02-24  Today's Date: 02/22/2018  Modified Barium Swallow completed.  Full report located under Chart Review in the Imaging Section.  Brief recommendations include the following:  Clinical Impression Pt presents with moderate oropharyngeal dysphagia, characterized as follows:Pt was given trials of thin liquid, nectar thick liquid, puree, solid, and barium tablet. ORALLY, pt is edentulous, and demonstrates poor mastication. He has significant xerostomia, which further impacts oral prep and propulsion. Residue apparently under the tongue was noted across consistencies, despite cues to swallow again. Oral stage of the swallow is noted to be discoordinated, increasing energy required, and therefore increasing aspiration risk. Posterior spillage across consistencies is also noted.   PHARYNGEALLY, pt exhibits swallow reflex at the level of the pyriform sinuses on thin liquid, and at the vallecular sinus on other consistencies. There is slight tongue base and vallecular residue noted after the swallow, due to decreased tongue base retraction and pharyngeal constriction. One episode of penetration on nectar thick liquids was seen, despite multiple presentations. Penetration to the level of the vocal folds was noted before and during the swallow of thin liquids, with weak throat clear in response. Barium tablet cleared the oropharynx without difficulty (given with nectar thick liquid)  Esophageal sweep revealed significant stasis throughout the esophagus. This increases risk for aspiration if stasis backs up to the UES and/or pyriform sinus. Recommend dys 2 (finely chopped) diet and nectar thick liquids, crushed meds. Per MD, pt is to be discharged today. Recommend speech therapy consult at next level of care for assessment of diet tolerance and education.      Swallow Evaluation Recommendations  Recommended Consults: Consider esophageal assessment   SLP Diet Recommendations: Dysphagia 2 (Fine chop) solids;Nectar thick liquid   Liquid Administration via: Cup;Straw   Medication Administration: Crushed with puree   Supervision: Full assist for feeding;Staff to assist with self feeding;Full supervision/cueing for compensatory strategies(for energy conservation)   Compensations: Minimize environmental distractions;Slow rate;Small sips/bites;Follow solids with liquid   Postural Changes: Remain semi-upright after after feeds/meals (Comment);Seated upright at 90 degrees   Oral Care Recommendations: Oral care QID  Celia B. Quentin Ore [facility], [place] Speech Language Pathologist [phone]  Shonna Chock 02/22/2018,3:23 PM

## 2018-02-22 NOTE — Discharge Summary (Signed)
Physician Discharge Summary  [name] WUJ:[id number] DOB: [birth year] DOA: 02/16/2018  PCP: Harlan Stains, MD  Admit date: 02/16/2018 Discharge date: 02/22/2018  Admitted From: Home Disposition:  SNF  Recommendations for Outpatient Follow-up:  1. Follow up with PCP in 1 week 2. Follow up with Dr. Erlinda Hong, orthopedic surgery, in 2 weeks for post-op suture removal  3. Follow up with Neurology in 4 weeks for stroke follow up  4. Recommend outpatient referral for Palliative Care Medicine to discuss ongoing goals of care, code status. Briefly discussed with daughter during hospitalization.  5. Avoid all NSAIDs, ibuprofen while on Eliquis and with hx of upper GI bleed   Discharge Condition: Stable CODE STATUS: Full  Diet recommendation: Dysphagia 2, continue SLP evaluation   Brief/Interim Summary: [name] is a 85 year old male with history of bladder cancer, renal cell carcinoma, essential hypertension, GERD came to the hospital after sustaining a fall while getting going to the bathroom. His x-ray in the ER showed mildly displaced right femoral neck fracture. Patient underwent ORIF on 1/12 which he tolerated well.On 1/14 morning there was some concerns of possible dysphagia versus patient pocketing food in his mouth therefore speech and swallow was consulted and he was made n.p.o. He has also had some episodes of confusion as well. Due to mentation change, he underwent MRI brain which reveal acute right fronto-parietal CVA as well as A Fib on EKG. He was evaluated by neurology and underwent appropriate stroke work up. He was started on Eliquis after discussion with family on benefit/risk of anticoagulation   Closed right femoral neck fracture status post ORIF 02/17/2018 -Postop management per orthopedic -Follow up with Dr. Erlinda Hong in 2 weeks for post-op suture removal  -PT/OTrecommend skilled nursing facility   Right frontoparietal CVA -CTA head/neck: no intracranial large or medium vessel  occlusion or correctable proxmal stenosis, 20% stenosis right prox ICA, 60% stenosis left prox ICA -Echo with normal EF 55-60%, no wall motion abnormalities  -Negative doppler US for DVT  -Stroke team consulted  -PT/OTrecommend skilled nursing facility  -Eliquis, pravachol   New onset A Fib -CHA2DS2-VASc: 5, high risk  -HASBLED: 3, high risk, estimated rate of major bleeding with 26yr of oral anticoagulation is 4.9-19.6%  -No previous hx per family. Discussed benefit vs risk. Patient has hx of massive UGIB in 2007 requiring 3x EGD and 7u pRBC transfusion. Family has decided to start Eliquis and understand risk of anticoagulation. Recommended to avoid all NSAIDs/ibuprofen.    Dysphagia -SLP Evaluation. Recommend dysphagia 2 diet   AKI versus CKD stage 3  -Last known baseline creatinine was 1.35 4 years ago. During this hospitalization, his creatinine has ranged from 1.63-1.79.  This is likely his new baseline.  Stable.   Essential hypertension -Not on any medications  GERD -PPI  History of renal cell carcinoma -Follow-up outpatient with primary care provider and oncology  Vascular dementia -Supportive care   Discharge Instructions  Discharge Instructions    Ambulatory referral to Neurology   Complete by:  As directed    Follow up with stroke clinic NP (Jessica Vanschaick or Cecille Rubin, if both not available, consider Zachery Dauer, or A[place]) at [facility] in about 4 weeks. Thanks.   Increase activity slowly   Complete by:  As directed    Weight bearing as tolerated   Complete by:  As directed      Allergies as of 02/22/2018      Reactions   Morphine Other (See Comments)   Makes patient "  crazy"      Medication List    STOP taking these medications   LORazepam 1 MG tablet Commonly known as:  ATIVAN   QUEtiapine 25 MG tablet Commonly known as:  SEROQUEL     TAKE these medications   acetaminophen 325 MG tablet Commonly known as:  TYLENOL Take 650 mg by  mouth every 6 (six) hours as needed (for pain).   apixaban 5 MG Tabs tablet Commonly known as:  ELIQUIS Take 1 tablet (5 mg total) by mouth 2 (two) times daily.   HYDROcodone-acetaminophen 7.5-325 MG tablet Commonly known as:  NORCO Take 1-2 tablets by mouth every 6 (six) hours as needed for moderate pain.   omeprazole 40 MG capsule Commonly known as:  PRILOSEC Take 40 mg by mouth at bedtime.   pravastatin 20 MG tablet Commonly known as:  PRAVACHOL Take 1 tablet (20 mg total) by mouth daily at 6 PM.   sertraline 100 MG tablet Commonly known as:  ZOLOFT Take 100 mg by mouth at bedtime.            Discharge Care Instructions  (From admission, onward)         Start     Ordered   02/17/18 0000  Weight bearing as tolerated     02/17/18 5573         Follow-up Information    Leandrew Koyanagi, MD. Schedule an appointment as soon as possible for a visit in 2 week(s).   Specialty:  Orthopedic Surgery Contact information: [place] [address] [phone]        [facility]. Schedule an appointment as soon as possible for a visit in 4 week(s).   Specialty:  Neurology Contact information: [place] [phone]         Allergies  Allergen Reactions  . Morphine Other (See Comments)    Makes patient "crazy"     Consultations:  Orthopedic surgery  Neurology    Procedures/Studies: Ct Angio Head W Or Wo Contrast  Result Date: 02/21/2018 CLINICAL DATA:  Follow-up stroke. Cluster of acute micro embolic infarctions at the right frontoparietal vertex. EXAM: CT ANGIOGRAPHY HEAD AND NECK TECHNIQUE: Multidetector CT imaging of the head and neck was performed using the standard protocol during bolus administration of intravenous contrast. Multiplanar CT image reconstructions and MIPs were obtained to evaluate the vascular anatomy. Carotid stenosis measurements (when  applicable) are obtained utilizing NASCET criteria, using the distal internal carotid diameter as the denominator. CONTRAST:  37mL ISOVUE-370 IOPAMIDOL (ISOVUE-370) INJECTION 76% COMPARISON:  MRI 02/20/2018 FINDINGS: CT HEAD FINDINGS Brain: Generalized atrophy. Extensive chronic small-vessel ischemic changes of the white matter. No sign of acute infarction by CT. No mass lesion, hemorrhage, hydrocephalus or extra-axial collection. The patient has a tendency towards dural calcification. Vascular: There is atherosclerotic calcification of the major vessels at the base of the brain. Skull: Negative Sinuses: Mild mucosal inflammatory changes. More extensive mucosal thickening in the sphenoid sinus. Orbits: Negative Review of the MIP images confirms the above findings CTA NECK FINDINGS Aortic arch: Aortic atherosclerosis. Aneurysm or dissection. Branching pattern is normal without origin stenosis. Right carotid system: Common carotid artery shows 30% stenosis at its origin but is widely patent beyond that to the bifurcation. There is calcified plaque at the carotid bifurcation and ICA bulb. Minimal diameter at the distal bulb is 4 mm. Compared to a more distal cervical ICA diameter of 5 mm, this indicates a 20%  stenosis. Left carotid system: Common carotid artery is widely patent to the bifurcation. At the carotid bifurcation and ICA bulb, there is extensive calcified plaque. Minimal diameter of the proximal ICA is 2 mm. Compared to a more distal cervical ICA diameter of 5 mm, this indicates a 60% stenosis. Vertebral arteries: Calcified plaque at both vertebral artery origins. On the right, there is stenosis of 30%. On the left, there is stenosis of 50%. Beyond that, vertebral arteries are widely patent through the cervical region to the foramen magnum. Skeleton: Chronic cervical spondylosis and facet arthropathy. Other neck: No mass or lymphadenopathy. Upper chest: Scarring and emphysematous change in the upper lungs.  Dependent pneumonia in both upper lobes with some layering pleural fluid. Review of the MIP images confirms the above findings CTA HEAD FINDINGS Anterior circulation: Both internal carotid arteries are patent through the skull base and siphon regions. There is ordinary atherosclerotic calcification in the carotid siphon regions without stenosis greater than 30%. The anterior and middle cerebral vessels are patent without proximal stenosis, aneurysm or vascular malformation. Posterior circulation: Both vertebral arteries are patent through the foramen magnum to the basilar. No basilar stenosis. Posterior circulation branch vessels are patent. Venous sinuses: Patent and normal. Anatomic variants: None significant. Delayed phase: No abnormal enhancement. Review of the MIP images confirms the above findings IMPRESSION: 1. Atherosclerotic disease at both carotid bifurcations. 20% stenosis of the proximal ICA on the right. 60% stenosis of the proximal ICA on the left. 2. Bilateral vertebral artery origin stenoses, 30% on the right and 50% on the left. 3. No intracranial large or medium vessel occlusion or correctable proximal stenosis. 4. Emphysema and pulmonary scarring. Dependent pneumonia in both upper lobes with layering pleural fluid. Aortic Atherosclerosis (ICD10-I70.0) and Emphysema (ICD10-J43.9). Electronically Signed   By: Nelson Chimes M.D.   On: 02/21/2018 11:37   Ct Head Wo Contrast  Result Date: 02/19/2018 CLINICAL DATA:  Altered level of consciousness, dementia EXAM: CT HEAD WITHOUT CONTRAST TECHNIQUE: Contiguous axial images were obtained from the base of the skull through the vertex without intravenous contrast. COMPARISON:  CT brain scan of 05/21/2014 FINDINGS: Brain: There is little change in ventricular dilatation and prominent cortical sulci diffusely consistent with diffuse atrophy. The septum is midline in position. Moderately severe small vessel ischemic change throughout the periventricular  white matter is unchanged. No hemorrhage, mass lesion, or acute infarction is seen. Vascular: No vascular abnormality is noted on this unenhanced study. Skull: On bone window images, no calvarial abnormality is noted. Sinuses/Orbits: There is evidence of right maxillary sinus disease with diffuse mucosal thickening throughout the right maxillary sinus and minimal mucosal thickening within the left maxillary sinus. No air-fluid level is seen. Some mucosal thickening is also present within the right partition of the sphenoid sinus. This may indicate chronic sphenoid sinusitis in view of the prior CT. Other: None. IMPRESSION: 1. Diffuse atrophy and moderately severe small vessel ischemic change throughout the periventricular white matter. No acute intracranial abnormality. 2. Probable chronic sphenoid sinus disease with maxillary sinus disease as well right greater than left. No air-fluid level is seen. Electronically Signed   By: Ivar Drape M.D.   On: 02/19/2018 14:53   Ct Angio Neck W Or Wo Contrast  Result Date: 02/21/2018 CLINICAL DATA:  Follow-up stroke. Cluster of acute micro embolic infarctions at the right frontoparietal vertex. EXAM: CT ANGIOGRAPHY HEAD AND NECK TECHNIQUE: Multidetector CT imaging of the head and neck was performed using the standard protocol during bolus administration of  intravenous contrast. Multiplanar CT image reconstructions and MIPs were obtained to evaluate the vascular anatomy. Carotid stenosis measurements (when applicable) are obtained utilizing NASCET criteria, using the distal internal carotid diameter as the denominator. CONTRAST:  84mL ISOVUE-370 IOPAMIDOL (ISOVUE-370) INJECTION 76% COMPARISON:  MRI 02/20/2018 FINDINGS: CT HEAD FINDINGS Brain: Generalized atrophy. Extensive chronic small-vessel ischemic changes of the white matter. No sign of acute infarction by CT. No mass lesion, hemorrhage, hydrocephalus or extra-axial collection. The patient has a tendency towards dural  calcification. Vascular: There is atherosclerotic calcification of the major vessels at the base of the brain. Skull: Negative Sinuses: Mild mucosal inflammatory changes. More extensive mucosal thickening in the sphenoid sinus. Orbits: Negative Review of the MIP images confirms the above findings CTA NECK FINDINGS Aortic arch: Aortic atherosclerosis. Aneurysm or dissection. Branching pattern is normal without origin stenosis. Right carotid system: Common carotid artery shows 30% stenosis at its origin but is widely patent beyond that to the bifurcation. There is calcified plaque at the carotid bifurcation and ICA bulb. Minimal diameter at the distal bulb is 4 mm. Compared to a more distal cervical ICA diameter of 5 mm, this indicates a 20% stenosis. Left carotid system: Common carotid artery is widely patent to the bifurcation. At the carotid bifurcation and ICA bulb, there is extensive calcified plaque. Minimal diameter of the proximal ICA is 2 mm. Compared to a more distal cervical ICA diameter of 5 mm, this indicates a 60% stenosis. Vertebral arteries: Calcified plaque at both vertebral artery origins. On the right, there is stenosis of 30%. On the left, there is stenosis of 50%. Beyond that, vertebral arteries are widely patent through the cervical region to the foramen magnum. Skeleton: Chronic cervical spondylosis and facet arthropathy. Other neck: No mass or lymphadenopathy. Upper chest: Scarring and emphysematous change in the upper lungs. Dependent pneumonia in both upper lobes with some layering pleural fluid. Review of the MIP images confirms the above findings CTA HEAD FINDINGS Anterior circulation: Both internal carotid arteries are patent through the skull base and siphon regions. There is ordinary atherosclerotic calcification in the carotid siphon regions without stenosis greater than 30%. The anterior and middle cerebral vessels are patent without proximal stenosis, aneurysm or vascular  malformation. Posterior circulation: Both vertebral arteries are patent through the foramen magnum to the basilar. No basilar stenosis. Posterior circulation branch vessels are patent. Venous sinuses: Patent and normal. Anatomic variants: None significant. Delayed phase: No abnormal enhancement. Review of the MIP images confirms the above findings IMPRESSION: 1. Atherosclerotic disease at both carotid bifurcations. 20% stenosis of the proximal ICA on the right. 60% stenosis of the proximal ICA on the left. 2. Bilateral vertebral artery origin stenoses, 30% on the right and 50% on the left. 3. No intracranial large or medium vessel occlusion or correctable proximal stenosis. 4. Emphysema and pulmonary scarring. Dependent pneumonia in both upper lobes with layering pleural fluid. Aortic Atherosclerosis (ICD10-I70.0) and Emphysema (ICD10-J43.9). Electronically Signed   By: Nelson Chimes M.D.   On: 02/21/2018 11:37   Mr Brain Wo Contrast  Result Date: 02/20/2018 CLINICAL DATA:  Dysphagia.  Unexplained encephalopathy. EXAM: MRI HEAD WITHOUT CONTRAST TECHNIQUE: Multiplanar, multiecho pulse sequences of the brain and surrounding structures were obtained without intravenous contrast. COMPARISON:  Head CT 02/19/2018.  MRI 11/12/2008. FINDINGS: Brain: Diffusion imaging shows a few punctate acute infarctions at the right frontoparietal vertex, none larger than 2 mm, consistent with a small grouping of micro embolic infarctions. No evidence of hemorrhage associated with those infarctions. Elsewhere,  the brain shows chronic ischemic changes affecting the pons. No focal cerebellar insult. Cerebral hemispheres show advanced chronic small-vessel ischemic changes of the deep and subcortical white matter. No large vessel territory infarction. No mass lesion, hemorrhage, hydrocephalus or extra-axial collection. Vascular: Major vessels at the base of the brain show flow. Skull and upper cervical spine: Negative Sinuses/Orbits:  Mucosal inflammatory changes of the right maxillary sinus and the sphenoid sinus. Orbits negative. Other: None IMPRESSION: Small grouping of acute micro embolic infarctions at the right frontoparietal vertex, none larger than 2 mm. No swelling or hemorrhage. Background pattern of advanced chronic small-vessel ischemic changes throughout the brain. Electronically Signed   By: Nelson Chimes M.D.   On: 02/20/2018 12:57   Pelvis Portable  Result Date: 02/17/2018 CLINICAL DATA:  Right total hip arthroplasty anterior approach EXAM: PORTABLE PELVIS 1-2 VIEWS COMPARISON:  Intraoperative right hip radiographs dated 02/17/2018 at 0936 hours FINDINGS: Right hip hemiarthroplasty in satisfactory position. Bilateral hip joint spaces are preserved. Visualized bony pelvis appears intact. Overlying soft tissue gas. IMPRESSION: Right hip hemiarthroplasty in satisfactory position. Electronically Signed   By: Julian Hy M.D.   On: 02/17/2018 20:45   Dg Pelvis Portable  Result Date: 02/16/2018 CLINICAL DATA:  Fall.  Right hip pain. EXAM: PORTABLE PELVIS 1-2 VIEWS COMPARISON:  None. FINDINGS: There is a fracture of the mid right femoral neck. No significant fracture comminution. Fracture is displaced, distal component displacing superiorly by 1.3 cm. No significant varus or valgus angulation. No other fractures. Skeletal structures are demineralized. No bone lesions. Hip joints, SI joints and symphysis pubis are normally aligned. IMPRESSION: 1. Mildly displaced right femoral neck fracture as described. No other fractures. No dislocation Electronically Signed   By: Lajean Manes M.D.   On: 02/16/2018 17:41   Dg Chest Port 1 View  Result Date: 02/19/2018 CLINICAL DATA:  Shortness of breath and dysphagia. History of bladder carcinoma EXAM: PORTABLE CHEST 1 VIEW COMPARISON:  February 16, 2018 FINDINGS: There is atelectatic change in the left base with small left pleural effusion. The lungs elsewhere are clear. Heart is upper  normal in size with pulmonary vascularity normal. There is aortic atherosclerosis. No adenopathy. Bones are osteoporotic. IMPRESSION: Left base atelectasis with small left pleural effusion. Early pneumonia in this area in the left base can not be excluded. Lungs elsewhere clear. Stable cardiac silhouette. No evident adenopathy. There is aortic atherosclerosis. Bones osteoporotic. Aortic Atherosclerosis (ICD10-I70.0). Electronically Signed   By: Lowella Grip III M.D.   On: 02/19/2018 11:27   Dg Chest Port 1 View  Result Date: 02/16/2018 CLINICAL DATA:  Fall with hip pain. EXAM: PORTABLE CHEST 1 VIEW COMPARISON:  12/16/2013 FINDINGS: 1652 hours. Low volume film. Cardiopericardial silhouette is at upper limits of normal for size. Interstitial markings are diffusely coarsened with chronic features. There is pulmonary vascular congestion without overt pulmonary edema. Patchy opacity at the lung bases is similar to prior and likely reflects scarring although pneumonia not excluded. Bones are diffusely demineralized. Telemetry leads overlie the chest. IMPRESSION: 1. Low volume film with borderline cardiomegaly and vascular congestion. 2. Patchy opacity at the lung bases likely reflects scarring although pneumonia not excluded. Electronically Signed   By: Misty Stanley M.D.   On: 02/16/2018 17:40   Dg C-arm 1-60 Min  Result Date: 02/17/2018 CLINICAL DATA:  Right hip hemiarthroplasty EXAM: DG C-ARM 61-120 MIN; OPERATIVE RIGHT HIP WITH PELVIS COMPARISON:  02/16/2018 FINDINGS: 2 intraoperative spot fluoro film show interval right hip hemiarthroplasty. No evidence for immediate  hardware complication. IMPRESSION: Status post right hip replacement for femoral neck fracture. Electronically Signed   By: Misty Stanley M.D.   On: 02/17/2018 16:04   Dg Hip Operative Unilat With Pelvis Right  Result Date: 02/17/2018 CLINICAL DATA:  Right hip hemiarthroplasty EXAM: DG C-ARM 61-120 MIN; OPERATIVE RIGHT HIP WITH PELVIS  COMPARISON:  02/16/2018 FINDINGS: 2 intraoperative spot fluoro film show interval right hip hemiarthroplasty. No evidence for immediate hardware complication. IMPRESSION: Status post right hip replacement for femoral neck fracture. Electronically Signed   By: Misty Stanley M.D.   On: 02/17/2018 16:04   Dg Femur Portable Min 2 Views Right  Result Date: 02/16/2018 CLINICAL DATA:  Fall today. Lateral right hip pain. Pt denies chest pain and SOB. No previous injury or surgery to the right hip.a EXAM: RIGHT FEMUR PORTABLE 2 VIEW COMPARISON:  None. FINDINGS: Fracture of the femoral neck, mildly displaced, shaft component displacing superiorly by 8-9 mm. No fracture comminution. No other fractures.  Hip and knee joints are normally aligned. IMPRESSION: 1. Right femoral neck fracture. No other fractures. No dislocation. Electronically Signed   By: Lajean Manes M.D.   On: 02/16/2018 17:40   Vas [place] Lower Extremity Venous (dvt)  Result Date: 02/21/2018  Lower Venous Study Indications: Stroke.  Performing Technologist: Abram Sander RVS  Examination Guidelines: A complete evaluation includes B-mode imaging, spectral Doppler, color Doppler, and power Doppler as needed of all accessible portions of each vessel. Bilateral testing is considered an integral part of a complete examination. Limited examinations for reoccurring indications may be performed as noted.  Right Venous Findings: +---------+---------------+---------+-----------+----------+-------+          CompressibilityPhasicitySpontaneityPropertiesSummary +---------+---------------+---------+-----------+----------+-------+ CFV      Full           Yes      Yes                          +---------+---------------+---------+-----------+----------+-------+ SFJ      Full                                                 +---------+---------------+---------+-----------+----------+-------+ FV Prox  Full                                                  +---------+---------------+---------+-----------+----------+-------+ FV Mid   Full                                                 +---------+---------------+---------+-----------+----------+-------+ FV DistalFull                                                 +---------+---------------+---------+-----------+----------+-------+ PFV      Full                                                 +---------+---------------+---------+-----------+----------+-------+  POP      Full           Yes      Yes                          +---------+---------------+---------+-----------+----------+-------+ PTV      Full                                                 +---------+---------------+---------+-----------+----------+-------+ PERO     Full                                                 +---------+---------------+---------+-----------+----------+-------+  Left Venous Findings: +---------+---------------+---------+-----------+----------+--------------+          CompressibilityPhasicitySpontaneityPropertiesSummary        +---------+---------------+---------+-----------+----------+--------------+ CFV      Full           Yes      Yes                                 +---------+---------------+---------+-----------+----------+--------------+ SFJ      Full                                                        +---------+---------------+---------+-----------+----------+--------------+ FV Prox  Full                                                        +---------+---------------+---------+-----------+----------+--------------+ FV Mid   Full                                                        +---------+---------------+---------+-----------+----------+--------------+ FV DistalFull                                                        +---------+---------------+---------+-----------+----------+--------------+ PFV      Full                                                         +---------+---------------+---------+-----------+----------+--------------+ POP      Full           Yes      Yes                                 +---------+---------------+---------+-----------+----------+--------------+  PTV      Full                                                        +---------+---------------+---------+-----------+----------+--------------+ PERO                                                  Not visualized +---------+---------------+---------+-----------+----------+--------------+    Summary: Right: There is no evidence of deep vein thrombosis in the lower extremity. No cystic structure found in the popliteal fossa. Left: There is no evidence of deep vein thrombosis in the lower extremity. No cystic structure found in the popliteal fossa.  *See table(s) above for measurements and observations. Electronically signed by Servando Snare MD on 02/21/2018 at 3:37:57 PM.    Final     Echo   Study Conclusions  - Left ventricle: The cavity size was normal. Wall thickness was   normal. Systolic function was normal. The estimated ejection   fraction was in the range of 55% to 60%. Wall motion was normal;   there were no regional wall motion abnormalities. The study is   not technically sufficient to allow evaluation of LV diastolic   function. - Aortic valve: There was mild stenosis. - Mitral valve: Calcified annulus. There was mild regurgitation.  Impressions:  - Normal LV systolic function; calcified aortic valve with mild AS   (mean gradient 17 mmHg) and trace AI; mild MR.     Discharge Exam: Vitals:   02/22/18 0400 02/22/18 1344  BP: 132/67 136/78  Pulse: 61 80  Resp: 15 16  Temp: 97.8 F (36.6 C) (!) 97.4 F (36.3 C)  SpO2: 97% 95%    General: Pt is alert, awake, not in acute distress Cardiovascular: Irreg rhythm, rate 70s, S1/S2 +, no rubs, no gallops Respiratory: CTA bilaterally, no wheezing, no  rhonchi Abdominal: Soft, NT, ND, bowel sounds + Extremities: no edema, no cyanosis Neuro: alert, oriented to city but not hospital, not oriented to year, able to recognize daughter at bedside, remains confused, no other focal deficits noted     The results of significant diagnostics from this hospitalization (including imaging, microbiology, ancillary and laboratory) are listed below for reference.     Microbiology: Recent Results (from the past 240 hour(s))  MRSA PCR Screening     Status: None   Collection Time: 02/17/18  4:56 AM  Result Value Ref Range Status   MRSA by PCR NEGATIVE NEGATIVE Final    Comment:        The GeneXpert MRSA Assay (FDA approved for NASAL specimens only), is one component of a comprehensive MRSA colonization surveillance program. It is not intended to diagnose MRSA infection nor to guide or monitor treatment for MRSA infections. Performed at [facility], [place] [address], [place], [place] [address]      Labs: BNP (last 3 results) No results for input(s): BNP in the last 8760 hours. Basic Metabolic Panel: Recent Labs  Lab 02/17/18 0429 02/18/18 0210 02/19/18 0408 02/21/18 0154 02/22/18 0555  NA 136 134* 134* 133* 133*  K 3.8 3.9 4.2 3.7 3.6  CL 101 103 103 100 100  CO2 22 23 21* 23 26  GLUCOSE 135* 128* 129* 114* 110*  BUN 22 18 18  24* 22  CREATININE 1.70* 1.63* 1.63* 1.32* 1.29*  CALCIUM 8.8* 7.9* 8.1* 8.1* 8.2*   Liver Function Tests: Recent Labs  Lab 02/16/18 1642 02/17/18 0429 02/20/18 1225  AST 24 29 18   ALT 20 17 8   ALKPHOS 80 75 72  BILITOT 1.0 1.3* 1.2  PROT 8.0 7.0 5.9*  ALBUMIN 3.5 3.1* 2.1*   No results for input(s): LIPASE, AMYLASE in the last 168 hours. Recent Labs  Lab 02/20/18 1225  AMMONIA 24   CBC: Recent Labs  Lab 02/18/18 0210 02/19/18 0408 02/20/18 0127 02/21/18 0154 02/22/18 0555  WBC 11.8* 11.9* 12.2* 8.6 7.1  HGB 11.0* 11.5* 11.0* 11.3* 11.8*  HCT 33.3* 35.0* 32.4* 32.4* 34.6*  MCV  95.1 95.9 94.5 93.1 94.8  PLT 118* 121* 161 165 170   Cardiac Enzymes: No results for input(s): CKTOTAL, CKMB, CKMBINDEX, TROPONINI in the last 168 hours. BNP: Invalid input(s): POCBNP CBG: No results for input(s): GLUCAP in the last 168 hours. D-Dimer No results for input(s): DDIMER in the last 72 hours. Hgb A1c Recent Labs    02/21/18 0154  HGBA1C 6.2*   Lipid Profile Recent Labs    02/21/18 0154  CHOL 147  HDL 15*  LDLCALC 94  TRIG 188*  CHOLHDL 9.8   Thyroid function studies Recent Labs    02/20/18 1225  TSH 1.905   Anemia work up Recent Labs    02/20/18 1225  VITAMINB12 498  FOLATE 11.6   Urinalysis    Component Value Date/Time   COLORURINE YELLOW 02/16/2018 1931   APPEARANCEUR CLEAR 02/16/2018 1931   LABSPEC 1.018 02/16/2018 1931   PHURINE 5.0 02/16/2018 1931   GLUCOSEU NEGATIVE 02/16/2018 1931   HGBUR NEGATIVE 02/16/2018 1931   BILIRUBINUR NEGATIVE 02/16/2018 1931   KETONESUR NEGATIVE 02/16/2018 1931   PROTEINUR NEGATIVE 02/16/2018 1931   UROBILINOGEN 1.0 12/08/2013 1846   NITRITE NEGATIVE 02/16/2018 1931   LEUKOCYTESUR NEGATIVE 02/16/2018 1931   Sepsis Labs Invalid input(s): PROCALCITONIN,  WBC,  LACTICIDVEN Microbiology Recent Results (from the past 240 hour(s))  MRSA PCR Screening     Status: None   Collection Time: 02/17/18  4:56 AM  Result Value Ref Range Status   MRSA by PCR NEGATIVE NEGATIVE Final    Comment:        The GeneXpert MRSA Assay (FDA approved for NASAL specimens only), is one component of a comprehensive MRSA colonization surveillance program. It is not intended to diagnose MRSA infection nor to guide or monitor treatment for MRSA infections. Performed at [facility], [place] [address], [place], [place] [address]      Patient was seen and examined on the day of discharge and was found to be in stable condition. Time coordinating discharge: 45 minutes including assessment and coordination of care, as well as  examination of the patient.   SIGNED:  Dessa Phi, DO [facility] [email or web address] 02/22/2018, 2:56 PM

## 2018-02-22 NOTE — Progress Notes (Signed)
Called [facility] and gave report to [place]. Written discharge instructions in discharge packet for EMS to transport. IV removed and pt tolerated. Daughter at bedside and made aware.

## 2018-02-24 LAB — VITAMIN B1: VITAMIN B1 (THIAMINE): 106.9 nmol/L (ref 66.5–200.0)

## 2018-02-25 DIAGNOSIS — I63111 Cerebral infarction due to embolism of right vertebral artery: Secondary | ICD-10-CM | POA: Diagnosis not present

## 2018-02-25 DIAGNOSIS — S72001A Fracture of unspecified part of neck of right femur, initial encounter for closed fracture: Secondary | ICD-10-CM | POA: Diagnosis not present

## 2018-02-25 DIAGNOSIS — N179 Acute kidney failure, unspecified: Secondary | ICD-10-CM | POA: Diagnosis not present

## 2018-02-26 DIAGNOSIS — Z9189 Other specified personal risk factors, not elsewhere classified: Secondary | ICD-10-CM | POA: Diagnosis not present

## 2018-02-26 DIAGNOSIS — F0150 Vascular dementia without behavioral disturbance: Secondary | ICD-10-CM | POA: Diagnosis not present

## 2018-02-26 DIAGNOSIS — K148 Other diseases of tongue: Secondary | ICD-10-CM | POA: Diagnosis not present

## 2018-03-04 DIAGNOSIS — I63111 Cerebral infarction due to embolism of right vertebral artery: Secondary | ICD-10-CM | POA: Diagnosis not present

## 2018-03-04 DIAGNOSIS — S51011A Laceration without foreign body of right elbow, initial encounter: Secondary | ICD-10-CM | POA: Diagnosis not present

## 2018-03-04 DIAGNOSIS — F0150 Vascular dementia without behavioral disturbance: Secondary | ICD-10-CM | POA: Diagnosis not present

## 2018-03-05 DIAGNOSIS — F0151 Vascular dementia with behavioral disturbance: Secondary | ICD-10-CM | POA: Diagnosis not present

## 2018-03-05 DIAGNOSIS — R630 Anorexia: Secondary | ICD-10-CM | POA: Diagnosis not present

## 2018-03-05 DIAGNOSIS — I63111 Cerebral infarction due to embolism of right vertebral artery: Secondary | ICD-10-CM | POA: Diagnosis not present

## 2018-03-05 DIAGNOSIS — R634 Abnormal weight loss: Secondary | ICD-10-CM | POA: Diagnosis not present

## 2018-03-07 ENCOUNTER — Ambulatory Visit (INDEPENDENT_AMBULATORY_CARE_PROVIDER_SITE_OTHER): Payer: No Typology Code available for payment source

## 2018-03-07 ENCOUNTER — Ambulatory Visit (INDEPENDENT_AMBULATORY_CARE_PROVIDER_SITE_OTHER): Payer: No Typology Code available for payment source | Admitting: Physician Assistant

## 2018-03-07 ENCOUNTER — Encounter (INDEPENDENT_AMBULATORY_CARE_PROVIDER_SITE_OTHER): Payer: Self-pay | Admitting: Orthopaedic Surgery

## 2018-03-07 DIAGNOSIS — R630 Anorexia: Secondary | ICD-10-CM | POA: Diagnosis not present

## 2018-03-07 DIAGNOSIS — R634 Abnormal weight loss: Secondary | ICD-10-CM | POA: Diagnosis not present

## 2018-03-07 DIAGNOSIS — Z96641 Presence of right artificial hip joint: Secondary | ICD-10-CM | POA: Diagnosis not present

## 2018-03-07 DIAGNOSIS — F0151 Vascular dementia with behavioral disturbance: Secondary | ICD-10-CM | POA: Diagnosis not present

## 2018-03-07 DIAGNOSIS — S72001A Fracture of unspecified part of neck of right femur, initial encounter for closed fracture: Secondary | ICD-10-CM | POA: Diagnosis not present

## 2018-03-07 NOTE — Progress Notes (Signed)
   Post-Op Visit Note   Patient: [name]           Date of Birth: 24-May-1923           MRN: [id number] Visit Date: 03/07/2018 PCP: Harlan Stains, MD   Assessment & Plan:  Chief Complaint:  Chief Complaint  Patient presents with  . Right Hip - Pain   Visit Diagnoses:  1. Status post right hip replacement     Plan: Patient is a pleasant 85 year old gentleman who presents our clinic today 18 days status post right anterior hip hemiarthroplasty, date of surgery 02/17/2018.  He suffered CVA during the initial postoperative period.  He has had increased weakness to bilateral lower extremities as well as dysphasia.  He is currently residing at [facility].  He comes in today for follow-up.  He has had moderate pain to the right hip which is starting to improve.  No fevers or chills.  He is getting around with a wheelchair at this time.  Examination of the right hip reveals a well-healed surgical incision with nylon sutures in place.  He does have slight peri-incisional erythema that looks more like irritation from the sutures.  No drainage.  No induration or tenderness.  He is neurovascularly intact distally.  At this point, he will continue to advance with physical therapy as tolerated.  Follow-up with [place] in 4 weeks time for repeat evaluation and x-rays.  Follow-Up Instructions: Return in about 4 weeks (around 04/04/2018).   Orders:  Orders Placed This Encounter  Procedures  . XR HIP UNILAT W OR W/O PELVIS 2-3 VIEWS RIGHT   No orders of the defined types were placed in this encounter.   Imaging: Xr Hip Unilat W Or W/o Pelvis 2-3 Views Right  Result Date: 03/07/2018 X-rays demonstrate stable alignment of the hip replacement   PMFS History: Patient Active Problem List   Diagnosis Date Noted  . Status post right hip replacement 03/07/2018  . Cerebral embolism with cerebral infarction 02/21/2018  . Closed displaced fracture of right femoral neck ([place]) 02/16/2018  . Dementia,  senile ([place]) 12/01/2013  . Confusion 11/30/2013  . Macular degeneration 11/30/2013  . HTN (hypertension) 11/30/2013  . Hyperlipemia 11/30/2013  . H/O renal cell cancer 11/30/2013  . CKD (chronic kidney disease) stage 3, GFR 30-59 ml/min (HCC) 11/30/2013  . BLADDER CANCER 10/31/2007  . KIDNEY CANCER 10/31/2007  . ACUT GASTROJEJUN ULCER W/HEMORR W/O MENTION OBST 10/31/2007   Past Medical History:  Diagnosis Date  . Acid reflux   . Bladder cancer ([place])   . Cancer ([place])   . High cholesterol   . Hypertension   . Renal cancer ([place])   . Stroke [facility])     History reviewed. No pertinent family history.  Past Surgical History:  Procedure Laterality Date  . NEPHRECTOMY    . TOTAL HIP ARTHROPLASTY Right 02/17/2018   Procedure: TOTAL HIP ARTHROPLASTY ANTERIOR APPROACH;  Surgeon: Leandrew Koyanagi, MD;  Location: [facility];  Service: Orthopedics;  Laterality: Right;   Social History   Occupational History  . Not on file  Tobacco Use  . Smoking status: Former Research scientist (life sciences)  . Smokeless tobacco: Never Used  Substance and Sexual Activity  . Alcohol use: No  . Drug use: No  . Sexual activity: Not on file

## 2018-03-10 DIAGNOSIS — F0151 Vascular dementia with behavioral disturbance: Secondary | ICD-10-CM | POA: Diagnosis not present

## 2018-03-10 DIAGNOSIS — F419 Anxiety disorder, unspecified: Secondary | ICD-10-CM | POA: Diagnosis not present

## 2018-03-10 DIAGNOSIS — I63111 Cerebral infarction due to embolism of right vertebral artery: Secondary | ICD-10-CM | POA: Diagnosis not present

## 2018-03-12 ENCOUNTER — Other Ambulatory Visit: Payer: Self-pay | Admitting: *Deleted

## 2018-03-12 NOTE — Patient Outreach (Signed)
[facility]) Care Management  03/12/2018  Tanish Prien Chason 1923/11/12 [id number]   Facility site visit to [facility] and [facility]. Collaboration with Francetta Found SW/DC planner concerning patient's progress, discharge plan and potential care management needs. SW states patient has not had a care planning meeting concerning discharge but is progressing with therapy.  SW stated patient is slightly confused but is alert and oriented to name and place. Patient admitted to SNF on 02/22/18 after a hospitalization for fall and right hip fracture. Planned discharge date is not set and discharge disposition is daughter's home where he has lived since 52.  Patient will have [place] at discharge. Patient was evaluated for community based chronic disease management services with [facility] care Management Program as a benefit of patient's Next Gen Medicare.   Went to patient's bedside to speak with patient and further assess for care management needs.  Patient noted to be slightly confused but able to hold a conversation.  Patient states his main support person is his daughter Weston Anna . Patient states transportation needs will be provided by his daughter. Patient states medication management provided by his daughter.  Introduced Psychologist, forensic and gave patient [facility] Patient Packet with my contact information included.  Patient agreed to [facility] services.  Patient gave [phone] as the best number to reach them. Patient  gave verbal permission to speak with his daughter Weston Anna.   Related to patient being slightly confused placed a call to his daughter Jenny Reichmann.  Jenny Reichmann confirmed patient has lived with her and her spouse since 76 when his spouse passed away.  Explained [facility] CM and Cindy expressed thankfulness for offering of services.  Daughter requested assistance from [facility] LCSW with ramp resources and caregiver resources.  Cindy  expressed gratitude for the call and stated her cell number is a good contact for her.  Referral placed for [facility] LCSW to reach out to daughter with resources prior to patient's SNF discharge.  Referral placed for [facility] to engage patient for transition of care and evaluate for monthly home visits.   [facility] Care Management services do not interfere with or replace any services arranged by the facility discharge planner.   Plan to make [facility] UM team member aware [facility] will be following for care management.   For additional questions please contact:   Debrina Kizer Monroy RN, [facility] Liaison  [phone]) Business Mobile [phone]) Toll free office

## 2018-03-19 ENCOUNTER — Other Ambulatory Visit: Payer: Self-pay | Admitting: *Deleted

## 2018-03-19 NOTE — Patient Outreach (Signed)
[facility]) Care Management  03/19/2018  [name] 1924/01/22 [id number]   Post Acute care coordination phone call to [place], discharge planner at [facility] to follow up on patient's progress in rehab and to discuss discharge needs.  There was no answer, voicemail message left for a return call.  Plan: This Education officer, museum will follow up within 1 week.     Sheralyn Boatman [facility] Care Management [phone]

## 2018-03-21 ENCOUNTER — Other Ambulatory Visit: Payer: Self-pay | Admitting: *Deleted

## 2018-03-21 NOTE — Patient Outreach (Signed)
[facility]) Care Management  03/21/2018  [name] [birth year] [id number]   Post Acute Care Coordination call   Phone call to patient's daughter [name] to discuss potential discharge and community resource needs for patient. Voicemail message left requesting a return call.    Sheralyn Boatman [facility] Care Management [phone]

## 2018-03-28 ENCOUNTER — Other Ambulatory Visit: Payer: Self-pay | Admitting: *Deleted

## 2018-03-28 ENCOUNTER — Encounter: Payer: Self-pay | Admitting: *Deleted

## 2018-03-28 NOTE — Patient Outreach (Signed)
[facility]) Care Management  03/28/2018  [name] 04-27-1923 [id number]   Post Acute Care Coordination call   Phone call to patient's daughter [name] to discuss potential discharge and community resource needs for patient.  Voicemail message left requesting a return call.    Sheralyn Boatman [facility] Care Management [phone]
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
## 2018-03-28 NOTE — Telephone Encounter (Signed)
This encounter was created in error - please disregard.

## 2018-03-28 NOTE — Patient Outreach (Signed)
[facility]) Care Management  03/28/2018  [name] Sep 15, 1923 [id number]   Post Acute Care Coordination Call   Collaboration phone call to [facility] Liaison regarding patient's progress in rehab. Per Liaison, patient will remain in rehab for the next couple of weeks.. Home visit to take place on 04/01/18 from the facility to evaluate home modifications completed.   Plan: This Education officer, museum will follow up with patient's daughter to assess for community resource needs.   Sheralyn Boatman [facility] Care Management [phone]

## 2018-04-01 ENCOUNTER — Other Ambulatory Visit: Payer: Self-pay | Admitting: *Deleted

## 2018-04-01 NOTE — Patient Outreach (Signed)
[facility]) Care Management  04/01/2018  Thermon Zulauf Schrag [birth year] [id number]   Facility site visit to [facility] and Rehab with [facility] LCSW to follow up on patient's discharge plan.  Went to patient's bedside.  Patient stated he was feeling stronger but is unaware of discharge plan.  Patient stated his daughter is working out the details.  [facility] LCSW in to talk with facility SW/Discharge planner.   Rutherford Limerick RN, BSN [place] Acute Care Coordinator [phone]) Business Mobile [phone]) Toll free office

## 2018-04-01 NOTE — Patient Outreach (Signed)
[place] [facility]) Care Management  04/01/2018  [name] [birth year] [id number]   Post Acute Care Coordination Visit   Post Acute Care Visit to [facility] to follow up on patient's progress while in rehab. Per patient, he is going to therapy daily and is feeling stronger. Patient would like to return home when rehab services are complete, however stated that he would rather this social worker speak with  his daughter regarding discharge plan.   This Education officer, museum spoke with discharge [place] to discuss patient's daughter's concerns regarding patient';s discharge home with limited supervision during the day and to discuss options. Per [name], she did receive a message today to reach out to patient's daughter to discuss discharge plan and agrees to contact her to discuss options. (in home care, family supports, facility care).   Plan: This Education officer, museum will continue to follow patient's progress in rehab.    Sheralyn Boatman [facility] Care Management [phone]

## 2018-04-02 DIAGNOSIS — F419 Anxiety disorder, unspecified: Secondary | ICD-10-CM | POA: Diagnosis not present

## 2018-04-02 DIAGNOSIS — F0151 Vascular dementia with behavioral disturbance: Secondary | ICD-10-CM | POA: Diagnosis not present

## 2018-04-02 DIAGNOSIS — F329 Major depressive disorder, single episode, unspecified: Secondary | ICD-10-CM | POA: Diagnosis not present

## 2018-04-03 ENCOUNTER — Encounter: Payer: Self-pay | Admitting: Adult Health

## 2018-04-03 ENCOUNTER — Ambulatory Visit (INDEPENDENT_AMBULATORY_CARE_PROVIDER_SITE_OTHER): Payer: Medicare Other | Admitting: Adult Health

## 2018-04-03 VITALS — BP 106/57 | HR 88 | Ht 68.0 in | Wt 138.2 lb

## 2018-04-03 DIAGNOSIS — I480 Paroxysmal atrial fibrillation: Secondary | ICD-10-CM | POA: Diagnosis not present

## 2018-04-03 DIAGNOSIS — I10 Essential (primary) hypertension: Secondary | ICD-10-CM | POA: Diagnosis not present

## 2018-04-03 DIAGNOSIS — I63411 Cerebral infarction due to embolism of right middle cerebral artery: Secondary | ICD-10-CM | POA: Diagnosis not present

## 2018-04-03 DIAGNOSIS — E785 Hyperlipidemia, unspecified: Secondary | ICD-10-CM | POA: Diagnosis not present

## 2018-04-03 DIAGNOSIS — R1310 Dysphagia, unspecified: Secondary | ICD-10-CM

## 2018-04-03 NOTE — Progress Notes (Signed)
I agree with the above plan 

## 2018-04-03 NOTE — Progress Notes (Signed)
[facility] [address] [place]. [place] [address] [phone]       OFFICE FOLLOW UP NOTE  Mr. Duc Crocket Santaella Date of Birth:  02-28-1923 Medical Record Number:  [id number]   Reason for Referral:  hospital stroke follow up  CHIEF COMPLAINT:  Chief Complaint  Patient presents with  . Hospitalization Follow-up    Np for stroke. Aid present. Rm 9. No concerns at this time.     HPI: Powell Halbert Granade is being seen today for initial visit in the office for right embolic MCA and MCA/ACA territory infarcts secondary to new onset A. fib on 02/19/2018. History obtained from patient, facility aide and chart review. Reviewed all radiology images and labs personally.  Mr. Virginia is a 85 year old male with history of hypertension, hyperlipidemia, bladder cancer, and prior stroke who was admitted status post fall.  Found to have right femoral neck fracture status post ORIF on 02/17/2018.  EKG showed A. fib, which was new to the patient. On 02/19/2018 he was found to have confusion and dysphasia. CT no acute finding, MRI showed right MCA and MCA/ACA territory 2-3 punctate DWI signal changes concerning for acute infarct.  TSH and ammonia within normal limit.  A1c 6.2 and LDL 94.  LE venous Doppler no DVT bilaterally.  2D echo showed an EF of 55 to 60%.  CTA head and neck left ICA proximal 60% stenosis, bilateral VA origin stenosis 30 to 50%.  Due to new diagnosis of atrial fibrillation and areas of infarct, suspect etiology cardioembolic and initiate Eliquis for secondary stroke prevention as he was independent prior to fall and not considered fall risk at home.  Also initiated pravastatin 20 mg for elevated LDL.  He was discharged to [facility] for ongoing therapy with ongoing deficits of dysphagia with diet recommendations dys 2 NTL and ongoing assistance with ADLs.  Mr. Luoma is being seen today for hospital discharge follow-up and is accompanied by facility aide.  He continues to have residual  stroke deficits of dysphagia, dysarthria and decreased right hand dexterity but otherwise doing well from a stroke standpoint.  Unable to determine if he is currently participating in any therapies based on paperwork provided by facility.  He is currently sitting in wheelchair but is able to ambulate with rolling walker and denies any recent falls.  He does have underlying dementia and unable to determine if any worsening since his stroke but he appears to be on Namenda at facility.  He continues on Eliquis without side effects of bleeding or bruising.  Continues on pravastatin without side effects myalgias.  Denies new or worsening stroke/TIA symptoms.   ROS:   14 system review of systems performed and negative with exception of see HPI  PMH:  Past Medical History:  Diagnosis Date  . Acid reflux   . Bladder cancer ([place])   . Cancer ([place])   . High cholesterol   . Hypertension   . Renal cancer ([place])   . Stroke [facility])     PSH:  Past Surgical History:  Procedure Laterality Date  . NEPHRECTOMY    . TOTAL HIP ARTHROPLASTY Right 02/17/2018   Procedure: TOTAL HIP ARTHROPLASTY ANTERIOR APPROACH;  Surgeon: Leandrew Koyanagi, MD;  Location: [place];  Service: Orthopedics;  Laterality: Right;    Social History:  Social History   Socioeconomic History  . Marital status: Widowed    Spouse name: Not on file  . Number of children: Not on file  . Years of  education: Not on file  . Highest education level: Not on file  Occupational History  . Not on file  Social Needs  . Financial resource strain: Not on file  . Food insecurity:    Worry: Not on file    Inability: Not on file  . Transportation needs:    Medical: Not on file    Non-medical: Not on file  Tobacco Use  . Smoking status: Former Research scientist (life sciences)  . Smokeless tobacco: Never Used  Substance and Sexual Activity  . Alcohol use: No  . Drug use: No  . Sexual activity: Not on file  Lifestyle  . Physical activity:    Days per week: Not on file      Minutes per session: Not on file  . Stress: Not on file  Relationships  . Social connections:    Talks on phone: Not on file    Gets together: Not on file    Attends religious service: Not on file    Active member of club or organization: Not on file    Attends meetings of clubs or organizations: Not on file    Relationship status: Not on file  . Intimate partner violence:    Fear of current or ex partner: Not on file    Emotionally abused: Not on file    Physically abused: Not on file    Forced sexual activity: Not on file  Other Topics Concern  . Not on file  Social History Narrative  . Not on file    Family History: No family history on file.  Medications:   Current Outpatient Medications on File Prior to Visit  Medication Sig Dispense Refill  . acetaminophen (TYLENOL) 325 MG tablet Take 650 mg by mouth every 6 (six) hours as needed (for pain).     Marland Kitchen apixaban (ELIQUIS) 5 MG TABS tablet Take 1 tablet (5 mg total) by mouth 2 (two) times daily. 60 tablet 0  . HYDROcodone-acetaminophen (NORCO) 7.5-325 MG tablet Take 1-2 tablets by mouth every 6 (six) hours as needed for moderate pain. 30 tablet 0  . omeprazole (PRILOSEC) 40 MG capsule Take 40 mg by mouth at bedtime.     . pravastatin (PRAVACHOL) 20 MG tablet Take 1 tablet (20 mg total) by mouth daily at 6 PM. 30 tablet 0  . sertraline (ZOLOFT) 100 MG tablet Take 100 mg by mouth at bedtime.      No current facility-administered medications on file prior to visit.     Allergies:   Allergies  Allergen Reactions  . Morphine Other (See Comments)    Makes patient "crazy"      Physical Exam  Vitals:   04/03/18 0825  Height: 5\' 8"  (1.727 m)   Body mass index is 25.85 kg/m. No exam data present  No flowsheet data found.   General: Frail pleasant elderly Caucasian male, seated, in no evident distress Head: head normocephalic and atraumatic.   Neck: supple with no carotid or supraclavicular bruits Cardiovascular:  regular rate and rhythm, no murmurs Musculoskeletal: no deformity Skin:  no rash/petichiae Vascular:  Normal pulses all extremities  Neurologic Exam Mental Status: Awake and fully alert.  Mild dysarthria.  Oriented to self but disoriented to time and place. Recent and remote memory diminished. attention span, concentration and fund of knowledge diminished. Mood and affect appropriate.  Cranial Nerves: Fundoscopic exam reveals sharp disc margins. Pupils equal, briskly reactive to light. Extraocular movements full without nystagmus. Visual fields full to confrontation. Hearing intact. Facial sensation  intact. Face, tongue, palate moves normally and symmetrically.  Motor: Normal bulk and tone. Normal strength in all tested extremity muscles. Sensory.: intact to touch , pinprick , position and vibratory sensation.  Coordination: Finger-to-nose performed accurately but heel-to-shin unable to perform due to right hip replacement (patient refused to try).  Decreased left hand finger dexterity Gait and Station: Arises from chair without difficulty. Stance is normal. Gait demonstrates normal stride length and balance. Able to heel, toe and tandem walk without difficulty.  Reflexes: 1+ and symmetric. Toes downgoing.    NIHSS  3 Modified Rankin  3 CHA2DS2-VASc 5 HAS-BLED 2   Diagnostic Data (Labs, Imaging, Testing)  CT HEAD WO CONTRAST 02/19/2018 IMPRESSION: 1. Diffuse atrophy and moderately severe small vessel ischemic change throughout the periventricular white matter. No acute intracranial abnormality. 2. Probable chronic sphenoid sinus disease with maxillary sinus disease as well right greater than left. No air-fluid level is seen.  CT ANGIO HEAD W OR WO CONTRAST CT ANGIO NECK W OR WO CONTRAST 02/21/2018 IMPRESSION: 1. Atherosclerotic disease at both carotid bifurcations. 20% stenosis of the proximal ICA on the right. 60% stenosis of the proximal ICA on the left. 2. Bilateral vertebral  artery origin stenoses, 30% on the right and 50% on the left. 3. No intracranial large or medium vessel occlusion or correctable proximal stenosis. 4. Emphysema and pulmonary scarring. Dependent pneumonia in both upper lobes with layering pleural fluid.  MR BRAIN WO CONTRAST 02/20/2018 IMPRESSION: Small grouping of acute micro embolic infarctions at the right frontoparietal vertex, none larger than 2 mm. No swelling or hemorrhage. Background pattern of advanced chronic small-vessel ischemic changes throughout the brain.  ECHOCARDIOGRAM 02/21/2018 Study Conclusions - Left ventricle: The cavity size was normal. Wall thickness was   normal. Systolic function was normal. The estimated ejection   fraction was in the range of 55% to 60%. Wall motion was normal;   there were no regional wall motion abnormalities. The study is   not technically sufficient to allow evaluation of LV diastolic   function. - Aortic valve: There was mild stenosis. - Mitral valve: Calcified annulus. There was mild regurgitation. Impressions: - Normal LV systolic function; calcified aortic valve with mild AS   (mean gradient 17 mmHg) and trace AI; mild MR.    ASSESSMENT: Jayveon Convey Fauteux is a 85 y.o. year old male here with right MCA and MCA/ACA embolic infarcts on 1/61/0960 secondary to new onset A. fib. Vascular risk factors include new onset A. fib, prior stroke, HLD, and HTN.  He is being seen today for hospital follow-up with continued mild left  hand decreased dexterity, dysphagia and dysarthria.    PLAN:  1. Right MCA and MCA/ACA infarcts: Continue Eliquis (apixaban) daily  and pravastatin for secondary stroke prevention. Maintain strict control of hypertension with blood pressure goal below 130/90, diabetes with hemoglobin A1c goal below 6.5% and cholesterol with LDL cholesterol (bad cholesterol) goal below 70 mg/dL.  I also advised the patient to eat a healthy diet with plenty of whole grains, cereals,  fruits and vegetables, exercise regularly with at least 30 minutes of continuous activity daily and maintain ideal body weight. 2. Residual deficits: Unable to adequately determine current participation in therapies.  Recommend participation in PT/OT/ST for ongoing deficits 3. HTN: Advised to continue current treatment regimen.  Today's BP 106/57.  Advised to continue to monitor at home along with continued follow-up with PCP for management 4. HLD: Advised to continue current treatment regimen along with continued follow-up with  PCP for future prescribing and monitoring of lipid panel 5. New onset A. Fib: Continue Eliquis for secondary stroke prevention    Follow up in 3 months or call earlier if needed   Greater than 50% of time during this 25 minute visit was spent on counseling, explanation of diagnosis of right MCA and MCA/ACA embolic infarcts, reviewing risk factor management of new onset A. fib, HTN and HLD, planning of further management along with potential future management, and discussion with patient and family answering all questions.    Venancio Poisson, AGNP-BC  [facility] [address] [place], [place] [address]  Phone [phone] Fax [phone] Note: This document was prepared with digital dictation and possible smart phrase technology. Any transcriptional errors that result from this process are unintentional.

## 2018-04-03 NOTE — Patient Instructions (Signed)
Continue Eliquis (apixaban) daily  and pravastatin  for secondary stroke prevention  Continue to follow up with PCP regarding cholesterol and blood pressure management   Ensure therapies are continued included physical, occupation and speech therapies for continued deficits  Continue to stay active while maintaining fall precautions  Continue to monitor blood pressure at home  Maintain strict control of hypertension with blood pressure goal below 130/90, diabetes with hemoglobin A1c goal below 6.5% and cholesterol with LDL cholesterol (bad cholesterol) goal below 70 mg/dL. I also advised the patient to eat a healthy diet with plenty of whole grains, cereals, fruits and vegetables, exercise regularly and maintain ideal body weight.  Followup in the future with me in 3 months or call earlier if needed       Thank you for coming to see [place] at [facility]. I hope we have been able to provide you high quality care today.  You may receive a patient satisfaction survey over the next few weeks. We would appreciate your feedback and comments so that we may continue to improve ourselves and the health of our patients.

## 2018-04-04 ENCOUNTER — Other Ambulatory Visit: Payer: Self-pay | Admitting: *Deleted

## 2018-04-04 ENCOUNTER — Ambulatory Visit (INDEPENDENT_AMBULATORY_CARE_PROVIDER_SITE_OTHER): Payer: No Typology Code available for payment source

## 2018-04-04 ENCOUNTER — Ambulatory Visit (INDEPENDENT_AMBULATORY_CARE_PROVIDER_SITE_OTHER): Payer: No Typology Code available for payment source | Admitting: Orthopaedic Surgery

## 2018-04-04 DIAGNOSIS — Z96641 Presence of right artificial hip joint: Secondary | ICD-10-CM

## 2018-04-04 NOTE — Patient Outreach (Signed)
[name] [facility]) Care Management  04/04/2018  [name] February 19, 1923 [id number]   Post Acute Care Coordination Call  Return call from patient's daughter who confirmed that she did speak to the discharge planner at [facility]. Patient has a discharge date of 04/16/18, however she is in the process of working on completing a medicaid application for long term care for patient. Per patient's daughter, at this time she does not feel that patient would be able to return home due to his medical condition. Per patient's daughter, the home visit for 04/01/18 by Isaias Cowman was cancelled as patient was too weak to travel.  The plan at this time is for patient to transfer to a long term care facility once patient's Medicaid is approved.  This social worker explained that due t this Education officer, museum will no longer  follow patient while he is at [facility] to a change in role.  Plan: This Education officer, museum will inform [place] of long term care plan.   Sheralyn Boatman [facility] Care Management [phone]

## 2018-04-04 NOTE — Progress Notes (Signed)
   Post-Op Visit Note   Patient: [name]           Date of Birth: Mar 05, 1923           MRN: [id number] Visit Date: 04/04/2018 PCP: Harlan Stains, MD   Assessment & Plan:  Chief Complaint:  Chief Complaint  Patient presents with  . Right Hip - Follow-up   Visit Diagnoses:  1. Status post right hip replacement     Plan: Juliocesar is 6 weeks status post right partial hip replacement for femoral neck fracture.  Postoperatively he unfortunately had a stroke which has now confined him to a wheelchair.  He denies any pain in his right hip.  His surgical scar is fully healed.  He has no pain with range of motion of the hip.  X-rays demonstrate stable partial hip replacement without complication.  At this point I hope that he can continue to improve from the stroke and that he can mobilize with physical therapy eventually.  He is stable from a orthopedic standpoint.  We will see him back in 6 weeks with standing AP pelvis and lateral hip x-rays  Follow-Up Instructions: Return in about 6 weeks (around 05/16/2018).   Orders:  Orders Placed This Encounter  Procedures  . XR HIP UNILAT W OR W/O PELVIS 1V RIGHT   No orders of the defined types were placed in this encounter.   Imaging: Xr Hip Unilat W Or W/o Pelvis 1v Right  Result Date: 04/04/2018 Stable right partial hip replacement.   PMFS History: Patient Active Problem List   Diagnosis Date Noted  . Status post right hip replacement 03/07/2018  . Cerebral embolism with cerebral infarction 02/21/2018  . Closed displaced fracture of right femoral neck ([place]) 02/16/2018  . Dementia, senile ([place]) 12/01/2013  . Confusion 11/30/2013  . Macular degeneration 11/30/2013  . HTN (hypertension) 11/30/2013  . Hyperlipemia 11/30/2013  . H/O renal cell cancer 11/30/2013  . CKD (chronic kidney disease) stage 3, GFR 30-59 ml/min (HCC) 11/30/2013  . BLADDER CANCER 10/31/2007  . KIDNEY CANCER 10/31/2007  . ACUT GASTROJEJUN ULCER W/HEMORR W/O  MENTION OBST 10/31/2007   Past Medical History:  Diagnosis Date  . Acid reflux   . Bladder cancer ([place])   . Cancer ([place])   . High cholesterol   . Hypertension   . Renal cancer ([place])   . Stroke [facility])     No family history on file.  Past Surgical History:  Procedure Laterality Date  . NEPHRECTOMY    . TOTAL HIP ARTHROPLASTY Right 02/17/2018   Procedure: TOTAL HIP ARTHROPLASTY ANTERIOR APPROACH;  Surgeon: Leandrew Koyanagi, MD;  Location: [place];  Service: Orthopedics;  Laterality: Right;   Social History   Occupational History  . Not on file  Tobacco Use  . Smoking status: Former Research scientist (life sciences)  . Smokeless tobacco: Never Used  Substance and Sexual Activity  . Alcohol use: No  . Drug use: No  . Sexual activity: Not on file

## 2018-04-04 NOTE — Patient Outreach (Signed)
[facility]) Care Management  04/04/2018  Andyn Sales Lefebre [birth year] [id number]   Phone call to patient's daughter to follow up on patient's progress in rehab and any information received from the discharge planner regarding patient's discharge plan. Voicemail message left requesting a return call.    Sheralyn Boatman [facility] Care Management [phone]

## 2018-04-08 DIAGNOSIS — R634 Abnormal weight loss: Secondary | ICD-10-CM | POA: Diagnosis not present

## 2018-04-08 DIAGNOSIS — F0150 Vascular dementia without behavioral disturbance: Secondary | ICD-10-CM | POA: Diagnosis not present

## 2018-04-08 DIAGNOSIS — I959 Hypotension, unspecified: Secondary | ICD-10-CM | POA: Diagnosis not present

## 2018-04-08 DIAGNOSIS — J40 Bronchitis, not specified as acute or chronic: Secondary | ICD-10-CM | POA: Diagnosis not present

## 2018-04-13 DIAGNOSIS — R630 Anorexia: Secondary | ICD-10-CM | POA: Diagnosis not present

## 2018-04-13 DIAGNOSIS — F0151 Vascular dementia with behavioral disturbance: Secondary | ICD-10-CM | POA: Diagnosis not present

## 2018-04-13 DIAGNOSIS — I4891 Unspecified atrial fibrillation: Secondary | ICD-10-CM | POA: Diagnosis not present

## 2018-04-13 DIAGNOSIS — I959 Hypotension, unspecified: Secondary | ICD-10-CM | POA: Diagnosis not present

## 2018-04-16 ENCOUNTER — Ambulatory Visit: Payer: Medicare Other

## 2018-04-16 ENCOUNTER — Other Ambulatory Visit: Payer: Self-pay

## 2018-04-16 DIAGNOSIS — F329 Major depressive disorder, single episode, unspecified: Secondary | ICD-10-CM | POA: Diagnosis not present

## 2018-04-16 DIAGNOSIS — F419 Anxiety disorder, unspecified: Secondary | ICD-10-CM | POA: Diagnosis not present

## 2018-04-16 DIAGNOSIS — F0151 Vascular dementia with behavioral disturbance: Secondary | ICD-10-CM | POA: Diagnosis not present

## 2018-04-16 NOTE — Patient Outreach (Signed)
[facility]) Care Management  04/16/2018  [name] 02/24/1923 [id number]   Received notification that "This patient's family has decided he will definitely will be staying long term care at [facility] so he will no longer need to be followed for CM." [facility] RN CM will not be required.   Benjamine Mola "ANN" Josiah Lobo, RN-BSN  [facility] Care Management  Community Care Management Coordinator  [phone] [email or web address]

## 2018-04-17 DIAGNOSIS — I63441 Cerebral infarction due to embolism of right cerebellar artery: Secondary | ICD-10-CM | POA: Diagnosis not present

## 2018-04-17 DIAGNOSIS — M6281 Muscle weakness (generalized): Secondary | ICD-10-CM | POA: Diagnosis not present

## 2018-04-17 DIAGNOSIS — I69322 Dysarthria following cerebral infarction: Secondary | ICD-10-CM | POA: Diagnosis not present

## 2018-04-17 DIAGNOSIS — R2689 Other abnormalities of gait and mobility: Secondary | ICD-10-CM | POA: Diagnosis not present

## 2018-04-17 DIAGNOSIS — R488 Other symbolic dysfunctions: Secondary | ICD-10-CM | POA: Diagnosis not present

## 2018-04-17 DIAGNOSIS — R278 Other lack of coordination: Secondary | ICD-10-CM | POA: Diagnosis not present

## 2018-04-17 DIAGNOSIS — I69391 Dysphagia following cerebral infarction: Secondary | ICD-10-CM | POA: Diagnosis not present

## 2018-04-17 NOTE — Telephone Encounter (Signed)
This encounter was created in error - please disregard.

## 2018-04-18 DIAGNOSIS — W19XXXD Unspecified fall, subsequent encounter: Secondary | ICD-10-CM | POA: Diagnosis not present

## 2018-04-18 DIAGNOSIS — S72001D Fracture of unspecified part of neck of right femur, subsequent encounter for closed fracture with routine healing: Secondary | ICD-10-CM | POA: Diagnosis not present

## 2018-04-18 DIAGNOSIS — I69391 Dysphagia following cerebral infarction: Secondary | ICD-10-CM | POA: Diagnosis not present

## 2018-04-18 DIAGNOSIS — K219 Gastro-esophageal reflux disease without esophagitis: Secondary | ICD-10-CM | POA: Diagnosis not present

## 2018-04-18 DIAGNOSIS — Z8551 Personal history of malignant neoplasm of bladder: Secondary | ICD-10-CM | POA: Diagnosis not present

## 2018-04-18 DIAGNOSIS — Z905 Acquired absence of kidney: Secondary | ICD-10-CM | POA: Diagnosis not present

## 2018-04-18 DIAGNOSIS — E785 Hyperlipidemia, unspecified: Secondary | ICD-10-CM | POA: Diagnosis not present

## 2018-04-18 DIAGNOSIS — I69354 Hemiplegia and hemiparesis following cerebral infarction affecting left non-dominant side: Secondary | ICD-10-CM | POA: Diagnosis not present

## 2018-04-18 DIAGNOSIS — I10 Essential (primary) hypertension: Secondary | ICD-10-CM | POA: Diagnosis not present

## 2018-04-18 DIAGNOSIS — Z85528 Personal history of other malignant neoplasm of kidney: Secondary | ICD-10-CM | POA: Diagnosis not present

## 2018-04-18 DIAGNOSIS — R634 Abnormal weight loss: Secondary | ICD-10-CM | POA: Diagnosis not present

## 2018-04-18 DIAGNOSIS — F0150 Vascular dementia without behavioral disturbance: Secondary | ICD-10-CM | POA: Diagnosis not present

## 2018-04-19 DIAGNOSIS — S72001D Fracture of unspecified part of neck of right femur, subsequent encounter for closed fracture with routine healing: Secondary | ICD-10-CM | POA: Diagnosis not present

## 2018-04-19 DIAGNOSIS — I69354 Hemiplegia and hemiparesis following cerebral infarction affecting left non-dominant side: Secondary | ICD-10-CM | POA: Diagnosis not present

## 2018-04-19 DIAGNOSIS — I69391 Dysphagia following cerebral infarction: Secondary | ICD-10-CM | POA: Diagnosis not present

## 2018-04-19 DIAGNOSIS — R634 Abnormal weight loss: Secondary | ICD-10-CM | POA: Diagnosis not present

## 2018-04-19 DIAGNOSIS — W19XXXD Unspecified fall, subsequent encounter: Secondary | ICD-10-CM | POA: Diagnosis not present

## 2018-04-19 DIAGNOSIS — F0150 Vascular dementia without behavioral disturbance: Secondary | ICD-10-CM | POA: Diagnosis not present

## 2018-04-22 DIAGNOSIS — S72001D Fracture of unspecified part of neck of right femur, subsequent encounter for closed fracture with routine healing: Secondary | ICD-10-CM | POA: Diagnosis not present

## 2018-04-22 DIAGNOSIS — R634 Abnormal weight loss: Secondary | ICD-10-CM | POA: Diagnosis not present

## 2018-04-22 DIAGNOSIS — I69391 Dysphagia following cerebral infarction: Secondary | ICD-10-CM | POA: Diagnosis not present

## 2018-04-22 DIAGNOSIS — I4891 Unspecified atrial fibrillation: Secondary | ICD-10-CM | POA: Diagnosis not present

## 2018-04-22 DIAGNOSIS — I69354 Hemiplegia and hemiparesis following cerebral infarction affecting left non-dominant side: Secondary | ICD-10-CM | POA: Diagnosis not present

## 2018-04-22 DIAGNOSIS — I63111 Cerebral infarction due to embolism of right vertebral artery: Secondary | ICD-10-CM | POA: Diagnosis not present

## 2018-04-22 DIAGNOSIS — F0150 Vascular dementia without behavioral disturbance: Secondary | ICD-10-CM | POA: Diagnosis not present

## 2018-04-22 DIAGNOSIS — W19XXXD Unspecified fall, subsequent encounter: Secondary | ICD-10-CM | POA: Diagnosis not present

## 2018-04-22 DIAGNOSIS — S72001A Fracture of unspecified part of neck of right femur, initial encounter for closed fracture: Secondary | ICD-10-CM | POA: Diagnosis not present

## 2018-04-24 DIAGNOSIS — W19XXXD Unspecified fall, subsequent encounter: Secondary | ICD-10-CM | POA: Diagnosis not present

## 2018-04-24 DIAGNOSIS — F0150 Vascular dementia without behavioral disturbance: Secondary | ICD-10-CM | POA: Diagnosis not present

## 2018-04-24 DIAGNOSIS — I69354 Hemiplegia and hemiparesis following cerebral infarction affecting left non-dominant side: Secondary | ICD-10-CM | POA: Diagnosis not present

## 2018-04-24 DIAGNOSIS — I69391 Dysphagia following cerebral infarction: Secondary | ICD-10-CM | POA: Diagnosis not present

## 2018-04-24 DIAGNOSIS — S72001D Fracture of unspecified part of neck of right femur, subsequent encounter for closed fracture with routine healing: Secondary | ICD-10-CM | POA: Diagnosis not present

## 2018-04-24 DIAGNOSIS — R634 Abnormal weight loss: Secondary | ICD-10-CM | POA: Diagnosis not present

## 2018-04-26 DIAGNOSIS — W19XXXD Unspecified fall, subsequent encounter: Secondary | ICD-10-CM | POA: Diagnosis not present

## 2018-04-26 DIAGNOSIS — I69354 Hemiplegia and hemiparesis following cerebral infarction affecting left non-dominant side: Secondary | ICD-10-CM | POA: Diagnosis not present

## 2018-04-26 DIAGNOSIS — F0150 Vascular dementia without behavioral disturbance: Secondary | ICD-10-CM | POA: Diagnosis not present

## 2018-04-26 DIAGNOSIS — I69391 Dysphagia following cerebral infarction: Secondary | ICD-10-CM | POA: Diagnosis not present

## 2018-04-26 DIAGNOSIS — S72001D Fracture of unspecified part of neck of right femur, subsequent encounter for closed fracture with routine healing: Secondary | ICD-10-CM | POA: Diagnosis not present

## 2018-04-26 DIAGNOSIS — R634 Abnormal weight loss: Secondary | ICD-10-CM | POA: Diagnosis not present

## 2018-04-29 DIAGNOSIS — W19XXXD Unspecified fall, subsequent encounter: Secondary | ICD-10-CM | POA: Diagnosis not present

## 2018-04-29 DIAGNOSIS — F0150 Vascular dementia without behavioral disturbance: Secondary | ICD-10-CM | POA: Diagnosis not present

## 2018-04-29 DIAGNOSIS — S72001D Fracture of unspecified part of neck of right femur, subsequent encounter for closed fracture with routine healing: Secondary | ICD-10-CM | POA: Diagnosis not present

## 2018-04-29 DIAGNOSIS — I69391 Dysphagia following cerebral infarction: Secondary | ICD-10-CM | POA: Diagnosis not present

## 2018-04-29 DIAGNOSIS — R634 Abnormal weight loss: Secondary | ICD-10-CM | POA: Diagnosis not present

## 2018-04-29 DIAGNOSIS — I69354 Hemiplegia and hemiparesis following cerebral infarction affecting left non-dominant side: Secondary | ICD-10-CM | POA: Diagnosis not present

## 2018-05-01 DIAGNOSIS — S72001D Fracture of unspecified part of neck of right femur, subsequent encounter for closed fracture with routine healing: Secondary | ICD-10-CM | POA: Diagnosis not present

## 2018-05-01 DIAGNOSIS — I69354 Hemiplegia and hemiparesis following cerebral infarction affecting left non-dominant side: Secondary | ICD-10-CM | POA: Diagnosis not present

## 2018-05-01 DIAGNOSIS — F0150 Vascular dementia without behavioral disturbance: Secondary | ICD-10-CM | POA: Diagnosis not present

## 2018-05-01 DIAGNOSIS — R634 Abnormal weight loss: Secondary | ICD-10-CM | POA: Diagnosis not present

## 2018-05-01 DIAGNOSIS — I69391 Dysphagia following cerebral infarction: Secondary | ICD-10-CM | POA: Diagnosis not present

## 2018-05-01 DIAGNOSIS — W19XXXD Unspecified fall, subsequent encounter: Secondary | ICD-10-CM | POA: Diagnosis not present

## 2018-05-03 DIAGNOSIS — I69354 Hemiplegia and hemiparesis following cerebral infarction affecting left non-dominant side: Secondary | ICD-10-CM | POA: Diagnosis not present

## 2018-05-03 DIAGNOSIS — S72001D Fracture of unspecified part of neck of right femur, subsequent encounter for closed fracture with routine healing: Secondary | ICD-10-CM | POA: Diagnosis not present

## 2018-05-03 DIAGNOSIS — R634 Abnormal weight loss: Secondary | ICD-10-CM | POA: Diagnosis not present

## 2018-05-03 DIAGNOSIS — W19XXXD Unspecified fall, subsequent encounter: Secondary | ICD-10-CM | POA: Diagnosis not present

## 2018-05-03 DIAGNOSIS — F0150 Vascular dementia without behavioral disturbance: Secondary | ICD-10-CM | POA: Diagnosis not present

## 2018-05-03 DIAGNOSIS — I69391 Dysphagia following cerebral infarction: Secondary | ICD-10-CM | POA: Diagnosis not present

## 2018-05-06 DIAGNOSIS — I69354 Hemiplegia and hemiparesis following cerebral infarction affecting left non-dominant side: Secondary | ICD-10-CM | POA: Diagnosis not present

## 2018-05-06 DIAGNOSIS — R630 Anorexia: Secondary | ICD-10-CM | POA: Diagnosis not present

## 2018-05-06 DIAGNOSIS — F0150 Vascular dementia without behavioral disturbance: Secondary | ICD-10-CM | POA: Diagnosis not present

## 2018-05-06 DIAGNOSIS — I69391 Dysphagia following cerebral infarction: Secondary | ICD-10-CM | POA: Diagnosis not present

## 2018-05-06 DIAGNOSIS — W19XXXD Unspecified fall, subsequent encounter: Secondary | ICD-10-CM | POA: Diagnosis not present

## 2018-05-06 DIAGNOSIS — S72001D Fracture of unspecified part of neck of right femur, subsequent encounter for closed fracture with routine healing: Secondary | ICD-10-CM | POA: Diagnosis not present

## 2018-05-06 DIAGNOSIS — R451 Restlessness and agitation: Secondary | ICD-10-CM | POA: Diagnosis not present

## 2018-05-06 DIAGNOSIS — R634 Abnormal weight loss: Secondary | ICD-10-CM | POA: Diagnosis not present

## 2018-05-06 DIAGNOSIS — Z515 Encounter for palliative care: Secondary | ICD-10-CM | POA: Diagnosis not present

## 2018-05-08 DIAGNOSIS — E785 Hyperlipidemia, unspecified: Secondary | ICD-10-CM | POA: Diagnosis not present

## 2018-05-08 DIAGNOSIS — I10 Essential (primary) hypertension: Secondary | ICD-10-CM | POA: Diagnosis not present

## 2018-05-08 DIAGNOSIS — W19XXXD Unspecified fall, subsequent encounter: Secondary | ICD-10-CM | POA: Diagnosis not present

## 2018-05-08 DIAGNOSIS — S72001D Fracture of unspecified part of neck of right femur, subsequent encounter for closed fracture with routine healing: Secondary | ICD-10-CM | POA: Diagnosis not present

## 2018-05-08 DIAGNOSIS — R634 Abnormal weight loss: Secondary | ICD-10-CM | POA: Diagnosis not present

## 2018-05-08 DIAGNOSIS — F0150 Vascular dementia without behavioral disturbance: Secondary | ICD-10-CM | POA: Diagnosis not present

## 2018-05-08 DIAGNOSIS — Z85528 Personal history of other malignant neoplasm of kidney: Secondary | ICD-10-CM | POA: Diagnosis not present

## 2018-05-08 DIAGNOSIS — I69354 Hemiplegia and hemiparesis following cerebral infarction affecting left non-dominant side: Secondary | ICD-10-CM | POA: Diagnosis not present

## 2018-05-08 DIAGNOSIS — I69391 Dysphagia following cerebral infarction: Secondary | ICD-10-CM | POA: Diagnosis not present

## 2018-05-08 DIAGNOSIS — Z905 Acquired absence of kidney: Secondary | ICD-10-CM | POA: Diagnosis not present

## 2018-05-08 DIAGNOSIS — Z8551 Personal history of malignant neoplasm of bladder: Secondary | ICD-10-CM | POA: Diagnosis not present

## 2018-05-08 DIAGNOSIS — K219 Gastro-esophageal reflux disease without esophagitis: Secondary | ICD-10-CM | POA: Diagnosis not present

## 2018-05-11 DIAGNOSIS — W19XXXD Unspecified fall, subsequent encounter: Secondary | ICD-10-CM | POA: Diagnosis not present

## 2018-05-11 DIAGNOSIS — I69391 Dysphagia following cerebral infarction: Secondary | ICD-10-CM | POA: Diagnosis not present

## 2018-05-11 DIAGNOSIS — S72001D Fracture of unspecified part of neck of right femur, subsequent encounter for closed fracture with routine healing: Secondary | ICD-10-CM | POA: Diagnosis not present

## 2018-05-11 DIAGNOSIS — F0150 Vascular dementia without behavioral disturbance: Secondary | ICD-10-CM | POA: Diagnosis not present

## 2018-05-11 DIAGNOSIS — R634 Abnormal weight loss: Secondary | ICD-10-CM | POA: Diagnosis not present

## 2018-05-11 DIAGNOSIS — I69354 Hemiplegia and hemiparesis following cerebral infarction affecting left non-dominant side: Secondary | ICD-10-CM | POA: Diagnosis not present

## 2018-05-13 ENCOUNTER — Telehealth (INDEPENDENT_AMBULATORY_CARE_PROVIDER_SITE_OTHER): Payer: Self-pay

## 2018-05-13 DIAGNOSIS — W19XXXD Unspecified fall, subsequent encounter: Secondary | ICD-10-CM | POA: Diagnosis not present

## 2018-05-13 DIAGNOSIS — R634 Abnormal weight loss: Secondary | ICD-10-CM | POA: Diagnosis not present

## 2018-05-13 DIAGNOSIS — Z515 Encounter for palliative care: Secondary | ICD-10-CM | POA: Diagnosis not present

## 2018-05-13 DIAGNOSIS — R451 Restlessness and agitation: Secondary | ICD-10-CM | POA: Diagnosis not present

## 2018-05-13 NOTE — Telephone Encounter (Signed)
Pt daughter called and stated that Hospice has taken over all of patient's care and wanted to let Dr Erlinda Hong aware, please inform. Thank you

## 2018-05-13 NOTE — Telephone Encounter (Signed)
fyi

## 2018-05-16 ENCOUNTER — Ambulatory Visit (INDEPENDENT_AMBULATORY_CARE_PROVIDER_SITE_OTHER): Payer: Medicare Other | Admitting: Physician Assistant

## 2018-05-16 ENCOUNTER — Ambulatory Visit (INDEPENDENT_AMBULATORY_CARE_PROVIDER_SITE_OTHER): Payer: Medicare Other | Admitting: Orthopaedic Surgery

## 2018-05-17 DIAGNOSIS — R634 Abnormal weight loss: Secondary | ICD-10-CM | POA: Diagnosis not present

## 2018-05-17 DIAGNOSIS — I69354 Hemiplegia and hemiparesis following cerebral infarction affecting left non-dominant side: Secondary | ICD-10-CM | POA: Diagnosis not present

## 2018-05-17 DIAGNOSIS — S72001D Fracture of unspecified part of neck of right femur, subsequent encounter for closed fracture with routine healing: Secondary | ICD-10-CM | POA: Diagnosis not present

## 2018-05-17 DIAGNOSIS — F0150 Vascular dementia without behavioral disturbance: Secondary | ICD-10-CM | POA: Diagnosis not present

## 2018-05-17 DIAGNOSIS — W19XXXD Unspecified fall, subsequent encounter: Secondary | ICD-10-CM | POA: Diagnosis not present

## 2018-05-17 DIAGNOSIS — I69391 Dysphagia following cerebral infarction: Secondary | ICD-10-CM | POA: Diagnosis not present

## 2018-05-21 DIAGNOSIS — R634 Abnormal weight loss: Secondary | ICD-10-CM | POA: Diagnosis not present

## 2018-05-21 DIAGNOSIS — W19XXXD Unspecified fall, subsequent encounter: Secondary | ICD-10-CM | POA: Diagnosis not present

## 2018-05-21 DIAGNOSIS — F0150 Vascular dementia without behavioral disturbance: Secondary | ICD-10-CM | POA: Diagnosis not present

## 2018-05-21 DIAGNOSIS — I69391 Dysphagia following cerebral infarction: Secondary | ICD-10-CM | POA: Diagnosis not present

## 2018-05-21 DIAGNOSIS — S72001D Fracture of unspecified part of neck of right femur, subsequent encounter for closed fracture with routine healing: Secondary | ICD-10-CM | POA: Diagnosis not present

## 2018-05-21 DIAGNOSIS — I69354 Hemiplegia and hemiparesis following cerebral infarction affecting left non-dominant side: Secondary | ICD-10-CM | POA: Diagnosis not present

## 2018-05-23 DIAGNOSIS — S72001D Fracture of unspecified part of neck of right femur, subsequent encounter for closed fracture with routine healing: Secondary | ICD-10-CM | POA: Diagnosis not present

## 2018-05-23 DIAGNOSIS — F0150 Vascular dementia without behavioral disturbance: Secondary | ICD-10-CM | POA: Diagnosis not present

## 2018-05-23 DIAGNOSIS — I69354 Hemiplegia and hemiparesis following cerebral infarction affecting left non-dominant side: Secondary | ICD-10-CM | POA: Diagnosis not present

## 2018-05-23 DIAGNOSIS — W19XXXD Unspecified fall, subsequent encounter: Secondary | ICD-10-CM | POA: Diagnosis not present

## 2018-05-23 DIAGNOSIS — I69391 Dysphagia following cerebral infarction: Secondary | ICD-10-CM | POA: Diagnosis not present

## 2018-05-23 DIAGNOSIS — R634 Abnormal weight loss: Secondary | ICD-10-CM | POA: Diagnosis not present

## 2018-05-28 DIAGNOSIS — F0150 Vascular dementia without behavioral disturbance: Secondary | ICD-10-CM | POA: Diagnosis not present

## 2018-05-28 DIAGNOSIS — S72001D Fracture of unspecified part of neck of right femur, subsequent encounter for closed fracture with routine healing: Secondary | ICD-10-CM | POA: Diagnosis not present

## 2018-05-28 DIAGNOSIS — I69354 Hemiplegia and hemiparesis following cerebral infarction affecting left non-dominant side: Secondary | ICD-10-CM | POA: Diagnosis not present

## 2018-05-28 DIAGNOSIS — I69391 Dysphagia following cerebral infarction: Secondary | ICD-10-CM | POA: Diagnosis not present

## 2018-05-28 DIAGNOSIS — R634 Abnormal weight loss: Secondary | ICD-10-CM | POA: Diagnosis not present

## 2018-05-28 DIAGNOSIS — W19XXXD Unspecified fall, subsequent encounter: Secondary | ICD-10-CM | POA: Diagnosis not present

## 2018-06-06 DIAGNOSIS — M6281 Muscle weakness (generalized): Secondary | ICD-10-CM | POA: Diagnosis not present

## 2018-06-06 DIAGNOSIS — I63441 Cerebral infarction due to embolism of right cerebellar artery: Secondary | ICD-10-CM | POA: Diagnosis not present

## 2018-06-06 DIAGNOSIS — S72031A Displaced midcervical fracture of right femur, initial encounter for closed fracture: Secondary | ICD-10-CM | POA: Diagnosis not present

## 2018-06-07 DIAGNOSIS — S72001D Fracture of unspecified part of neck of right femur, subsequent encounter for closed fracture with routine healing: Secondary | ICD-10-CM | POA: Diagnosis not present

## 2018-06-07 DIAGNOSIS — Z8551 Personal history of malignant neoplasm of bladder: Secondary | ICD-10-CM | POA: Diagnosis not present

## 2018-06-07 DIAGNOSIS — Z905 Acquired absence of kidney: Secondary | ICD-10-CM | POA: Diagnosis not present

## 2018-06-07 DIAGNOSIS — I69391 Dysphagia following cerebral infarction: Secondary | ICD-10-CM | POA: Diagnosis not present

## 2018-06-07 DIAGNOSIS — I10 Essential (primary) hypertension: Secondary | ICD-10-CM | POA: Diagnosis not present

## 2018-06-07 DIAGNOSIS — Z85528 Personal history of other malignant neoplasm of kidney: Secondary | ICD-10-CM | POA: Diagnosis not present

## 2018-06-07 DIAGNOSIS — I69354 Hemiplegia and hemiparesis following cerebral infarction affecting left non-dominant side: Secondary | ICD-10-CM | POA: Diagnosis not present

## 2018-06-07 DIAGNOSIS — W19XXXD Unspecified fall, subsequent encounter: Secondary | ICD-10-CM | POA: Diagnosis not present

## 2018-06-07 DIAGNOSIS — K219 Gastro-esophageal reflux disease without esophagitis: Secondary | ICD-10-CM | POA: Diagnosis not present

## 2018-06-07 DIAGNOSIS — E785 Hyperlipidemia, unspecified: Secondary | ICD-10-CM | POA: Diagnosis not present

## 2018-06-07 DIAGNOSIS — F0150 Vascular dementia without behavioral disturbance: Secondary | ICD-10-CM | POA: Diagnosis not present

## 2018-06-07 DIAGNOSIS — R634 Abnormal weight loss: Secondary | ICD-10-CM | POA: Diagnosis not present

## 2018-06-09 DIAGNOSIS — S72031A Displaced midcervical fracture of right femur, initial encounter for closed fracture: Secondary | ICD-10-CM | POA: Diagnosis not present

## 2018-06-09 DIAGNOSIS — M6281 Muscle weakness (generalized): Secondary | ICD-10-CM | POA: Diagnosis not present

## 2018-06-09 DIAGNOSIS — I63441 Cerebral infarction due to embolism of right cerebellar artery: Secondary | ICD-10-CM | POA: Diagnosis not present

## 2018-06-10 DIAGNOSIS — M6281 Muscle weakness (generalized): Secondary | ICD-10-CM | POA: Diagnosis not present

## 2018-06-10 DIAGNOSIS — I63441 Cerebral infarction due to embolism of right cerebellar artery: Secondary | ICD-10-CM | POA: Diagnosis not present

## 2018-06-10 DIAGNOSIS — S72031A Displaced midcervical fracture of right femur, initial encounter for closed fracture: Secondary | ICD-10-CM | POA: Diagnosis not present

## 2018-06-11 DIAGNOSIS — S72001D Fracture of unspecified part of neck of right femur, subsequent encounter for closed fracture with routine healing: Secondary | ICD-10-CM | POA: Diagnosis not present

## 2018-06-11 DIAGNOSIS — I69391 Dysphagia following cerebral infarction: Secondary | ICD-10-CM | POA: Diagnosis not present

## 2018-06-11 DIAGNOSIS — S72031A Displaced midcervical fracture of right femur, initial encounter for closed fracture: Secondary | ICD-10-CM | POA: Diagnosis not present

## 2018-06-11 DIAGNOSIS — R634 Abnormal weight loss: Secondary | ICD-10-CM | POA: Diagnosis not present

## 2018-06-11 DIAGNOSIS — M6281 Muscle weakness (generalized): Secondary | ICD-10-CM | POA: Diagnosis not present

## 2018-06-11 DIAGNOSIS — F0150 Vascular dementia without behavioral disturbance: Secondary | ICD-10-CM | POA: Diagnosis not present

## 2018-06-11 DIAGNOSIS — I69354 Hemiplegia and hemiparesis following cerebral infarction affecting left non-dominant side: Secondary | ICD-10-CM | POA: Diagnosis not present

## 2018-06-11 DIAGNOSIS — W19XXXD Unspecified fall, subsequent encounter: Secondary | ICD-10-CM | POA: Diagnosis not present

## 2018-06-11 DIAGNOSIS — I63441 Cerebral infarction due to embolism of right cerebellar artery: Secondary | ICD-10-CM | POA: Diagnosis not present

## 2018-06-12 DIAGNOSIS — M6281 Muscle weakness (generalized): Secondary | ICD-10-CM | POA: Diagnosis not present

## 2018-06-12 DIAGNOSIS — S72031A Displaced midcervical fracture of right femur, initial encounter for closed fracture: Secondary | ICD-10-CM | POA: Diagnosis not present

## 2018-06-12 DIAGNOSIS — I63441 Cerebral infarction due to embolism of right cerebellar artery: Secondary | ICD-10-CM | POA: Diagnosis not present

## 2018-06-17 DIAGNOSIS — R634 Abnormal weight loss: Secondary | ICD-10-CM | POA: Diagnosis not present

## 2018-06-17 DIAGNOSIS — F0150 Vascular dementia without behavioral disturbance: Secondary | ICD-10-CM | POA: Diagnosis not present

## 2018-06-17 DIAGNOSIS — W19XXXD Unspecified fall, subsequent encounter: Secondary | ICD-10-CM | POA: Diagnosis not present

## 2018-06-17 DIAGNOSIS — R400 Somnolence: Secondary | ICD-10-CM | POA: Diagnosis not present

## 2018-06-17 DIAGNOSIS — S72001D Fracture of unspecified part of neck of right femur, subsequent encounter for closed fracture with routine healing: Secondary | ICD-10-CM | POA: Diagnosis not present

## 2018-06-17 DIAGNOSIS — Z515 Encounter for palliative care: Secondary | ICD-10-CM | POA: Diagnosis not present

## 2018-06-17 DIAGNOSIS — I69391 Dysphagia following cerebral infarction: Secondary | ICD-10-CM | POA: Diagnosis not present

## 2018-06-17 DIAGNOSIS — R630 Anorexia: Secondary | ICD-10-CM | POA: Diagnosis not present

## 2018-06-17 DIAGNOSIS — I69354 Hemiplegia and hemiparesis following cerebral infarction affecting left non-dominant side: Secondary | ICD-10-CM | POA: Diagnosis not present

## 2018-06-18 DIAGNOSIS — W19XXXD Unspecified fall, subsequent encounter: Secondary | ICD-10-CM | POA: Diagnosis not present

## 2018-06-18 DIAGNOSIS — I69391 Dysphagia following cerebral infarction: Secondary | ICD-10-CM | POA: Diagnosis not present

## 2018-06-18 DIAGNOSIS — I69354 Hemiplegia and hemiparesis following cerebral infarction affecting left non-dominant side: Secondary | ICD-10-CM | POA: Diagnosis not present

## 2018-06-18 DIAGNOSIS — R0902 Hypoxemia: Secondary | ICD-10-CM | POA: Diagnosis not present

## 2018-06-18 DIAGNOSIS — S72001D Fracture of unspecified part of neck of right femur, subsequent encounter for closed fracture with routine healing: Secondary | ICD-10-CM | POA: Diagnosis not present

## 2018-06-18 DIAGNOSIS — Z515 Encounter for palliative care: Secondary | ICD-10-CM | POA: Diagnosis not present

## 2018-06-18 DIAGNOSIS — R634 Abnormal weight loss: Secondary | ICD-10-CM | POA: Diagnosis not present

## 2018-06-18 DIAGNOSIS — F0150 Vascular dementia without behavioral disturbance: Secondary | ICD-10-CM | POA: Diagnosis not present

## 2018-06-19 DIAGNOSIS — I69354 Hemiplegia and hemiparesis following cerebral infarction affecting left non-dominant side: Secondary | ICD-10-CM | POA: Diagnosis not present

## 2018-06-19 DIAGNOSIS — S72001D Fracture of unspecified part of neck of right femur, subsequent encounter for closed fracture with routine healing: Secondary | ICD-10-CM | POA: Diagnosis not present

## 2018-06-19 DIAGNOSIS — W19XXXD Unspecified fall, subsequent encounter: Secondary | ICD-10-CM | POA: Diagnosis not present

## 2018-06-19 DIAGNOSIS — R634 Abnormal weight loss: Secondary | ICD-10-CM | POA: Diagnosis not present

## 2018-06-19 DIAGNOSIS — I69391 Dysphagia following cerebral infarction: Secondary | ICD-10-CM | POA: Diagnosis not present

## 2018-06-19 DIAGNOSIS — F0150 Vascular dementia without behavioral disturbance: Secondary | ICD-10-CM | POA: Diagnosis not present

## 2018-06-20 DIAGNOSIS — R634 Abnormal weight loss: Secondary | ICD-10-CM | POA: Diagnosis not present

## 2018-06-20 DIAGNOSIS — I69354 Hemiplegia and hemiparesis following cerebral infarction affecting left non-dominant side: Secondary | ICD-10-CM | POA: Diagnosis not present

## 2018-06-20 DIAGNOSIS — W19XXXD Unspecified fall, subsequent encounter: Secondary | ICD-10-CM | POA: Diagnosis not present

## 2018-06-20 DIAGNOSIS — F0150 Vascular dementia without behavioral disturbance: Secondary | ICD-10-CM | POA: Diagnosis not present

## 2018-06-20 DIAGNOSIS — I69391 Dysphagia following cerebral infarction: Secondary | ICD-10-CM | POA: Diagnosis not present

## 2018-06-20 DIAGNOSIS — S72001D Fracture of unspecified part of neck of right femur, subsequent encounter for closed fracture with routine healing: Secondary | ICD-10-CM | POA: Diagnosis not present

## 2018-07-08 DEATH — deceased

## 2018-07-11 ENCOUNTER — Ambulatory Visit: Payer: Medicare Other | Admitting: Adult Health

## 2020-07-25 IMAGING — DX DG PORTABLE PELVIS
1 series · 1 of 1 positions shown · non-contrast
Comparison: Intraoperative right hip radiographs dated 02/17/2018
at 9626 hours

CLINICAL DATA: Right total hip arthroplasty anterior approach

EXAM:
PORTABLE PELVIS 1-2 VIEWS

[pelvis]
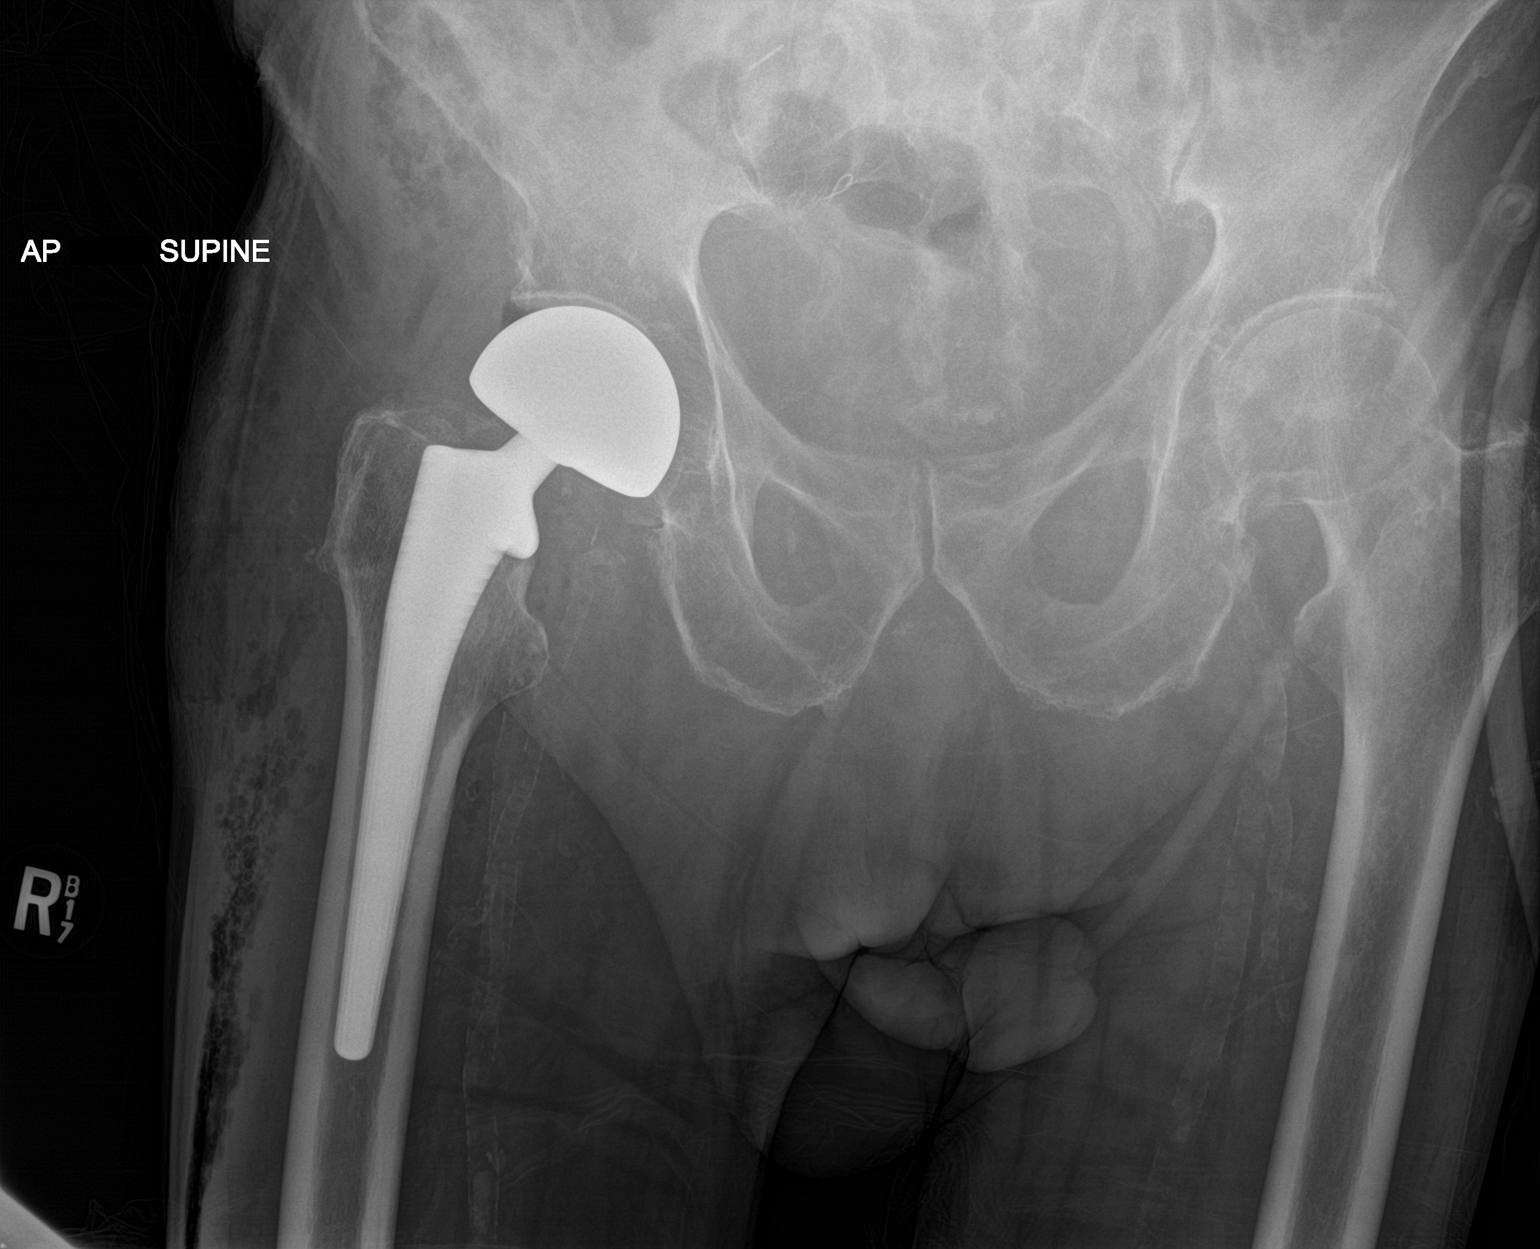

[1 of 1 positions shown; findings below may reference images not displayed]

FINDINGS: Right hip hemiarthroplasty in satisfactory position.

Bilateral hip joint spaces are preserved. Visualized bony pelvis
appears intact.

Overlying soft tissue gas.
IMPRESSION: Right hip hemiarthroplasty in satisfactory position.

## 2020-07-27 IMAGING — CT CT HEAD W/O CM
4 of 5 series · 14 of 47 positions shown, 16 images · non-contrast
Comparison: CT brain scan of 05/21/2014

CLINICAL DATA: Altered level of consciousness, dementia

EXAM:
CT HEAD WITHOUT CONTRAST
TECHNIQUE: Contiguous axial images were obtained from the base of the skull
through the vertex without intravenous contrast.

[Series 3: head without · axial · non-contrast · 0.47mm/px · z∈[+866,+932]mm · 3 of 34 slices shown (1 of 2)]
[im 7/34  brain]
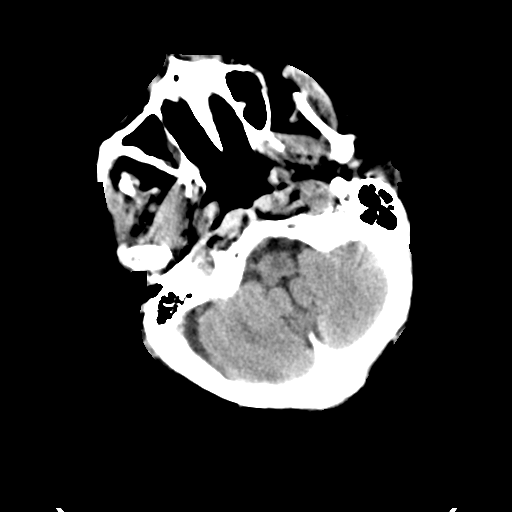
[im 14/34  brain]
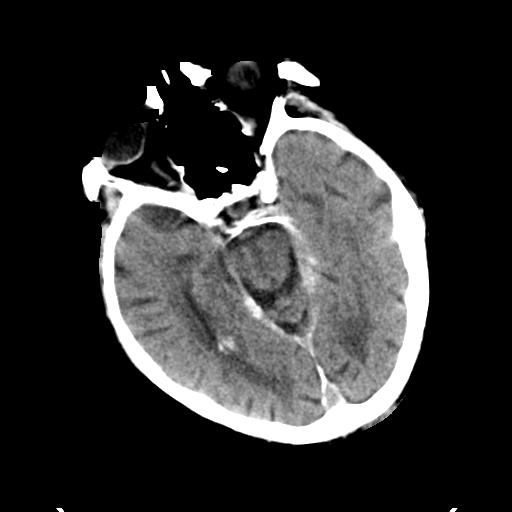
[im 20/34  brain]
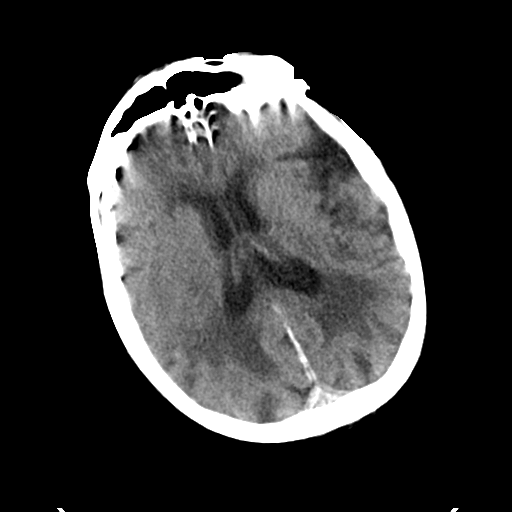

[Series 4: head without · axial · non-contrast · 0.47mm/px · z∈[+862,+972]mm · 5 of 34 slices shown, 7 images (2 of 2)]
[im 6/34  brain]
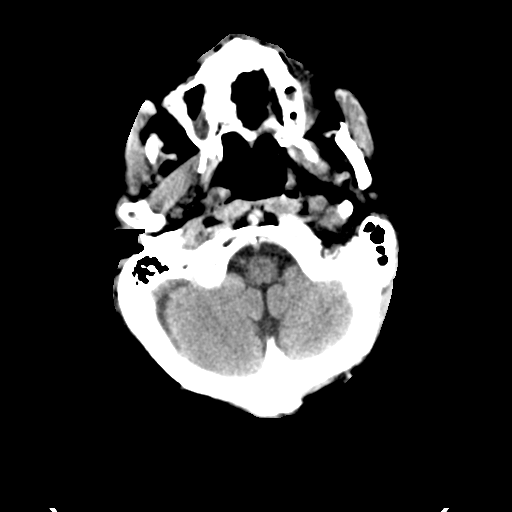
[im 6/34  bone]
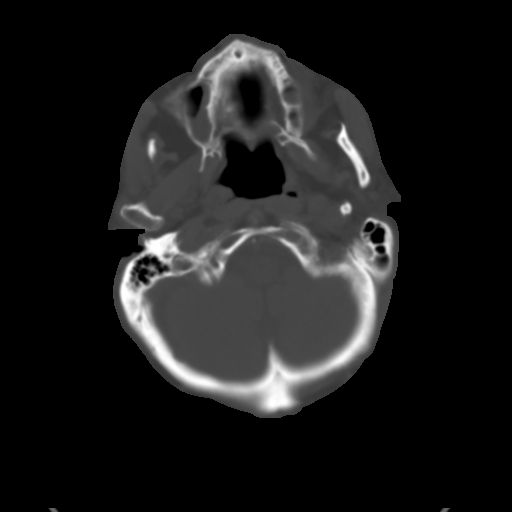
[im 12/34  brain]
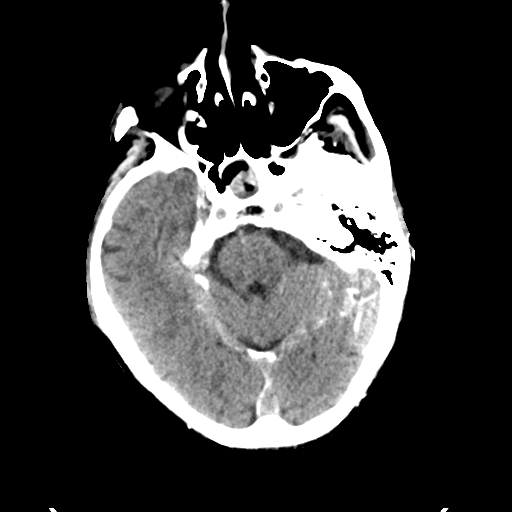
[im 17/34  brain]
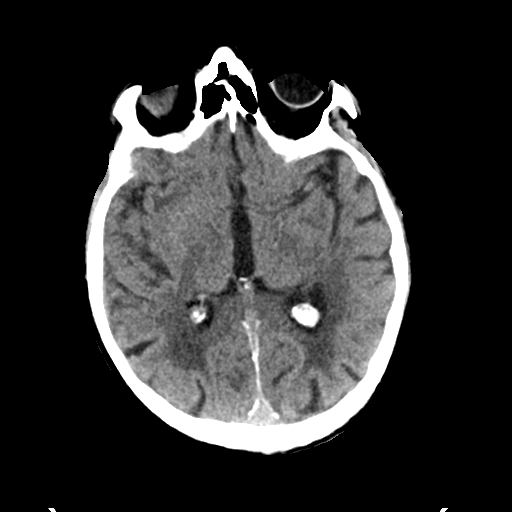
[im 23/34  brain]
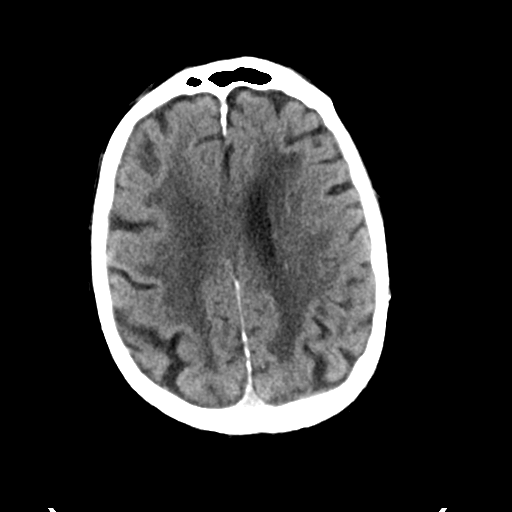
[im 28/34  brain]
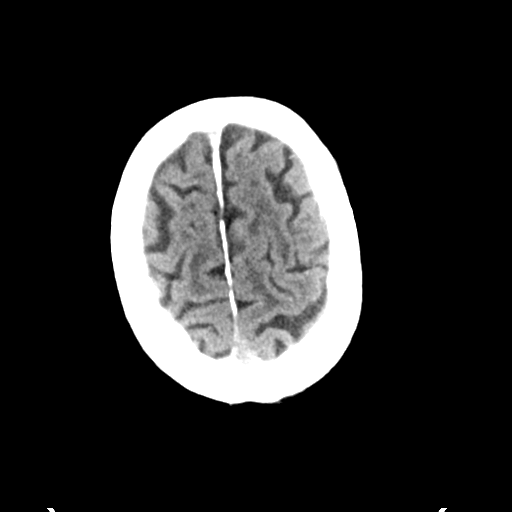
[im 28/34  bone]
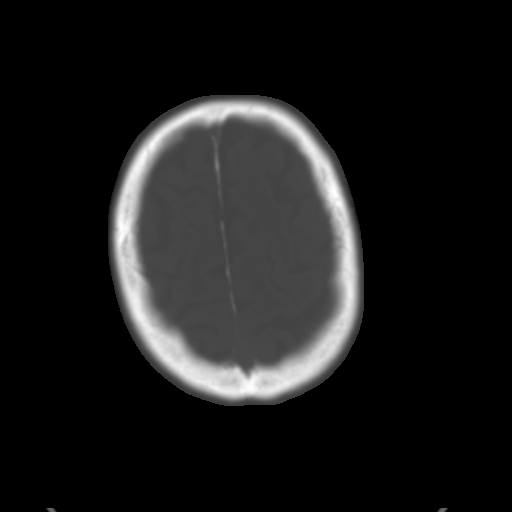

[Series 6: head without cor · coronal · non-contrast · 0.33mm/px · 3 of 77 slices shown]
[im 26/77  brain]
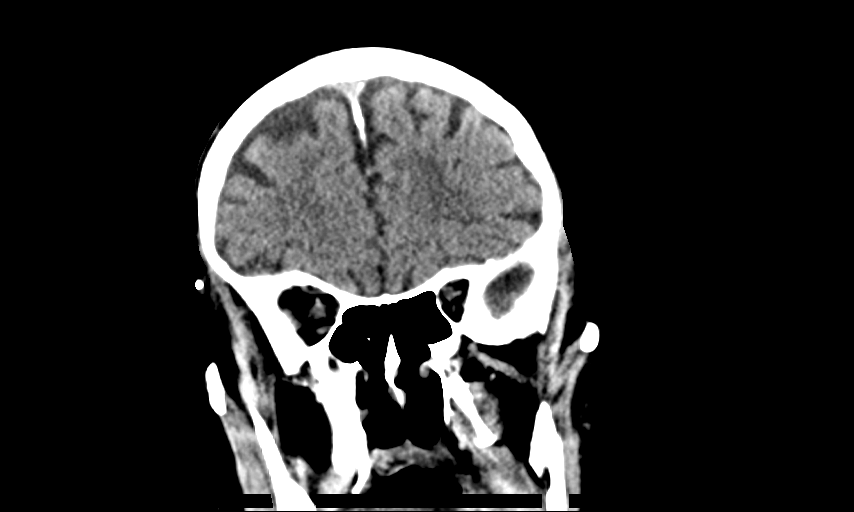
[im 34/77  brain]
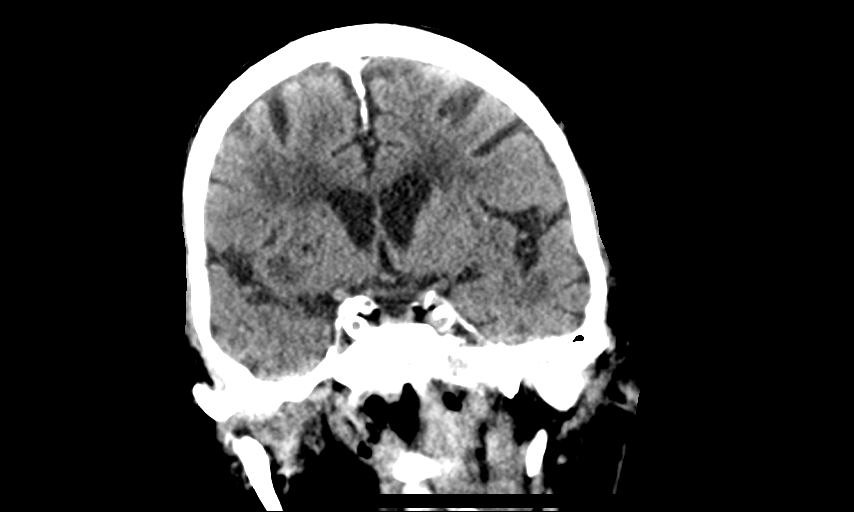
[im 43/77  brain]
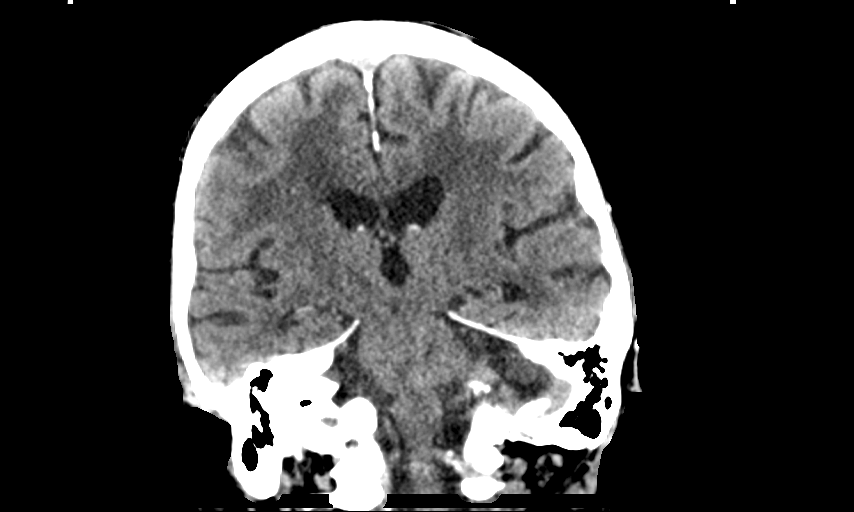

[Series 7: head without sag · sagittal · non-contrast · 0.44mm/px · 3 of 65 slices shown]
[im 22/65  brain]
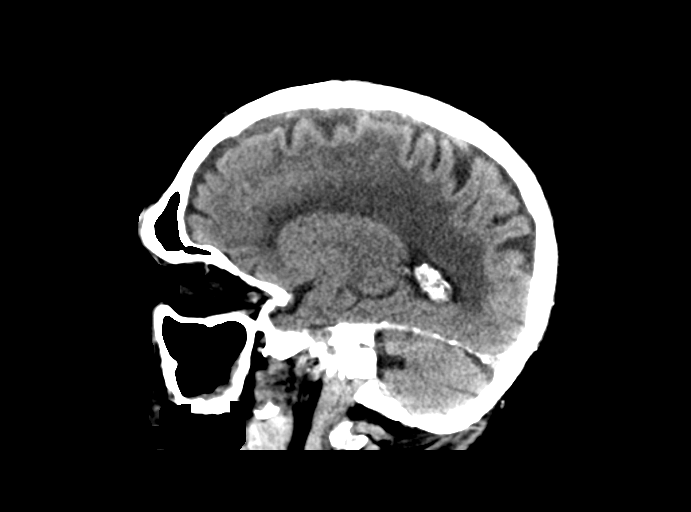
[im 33/65  brain]
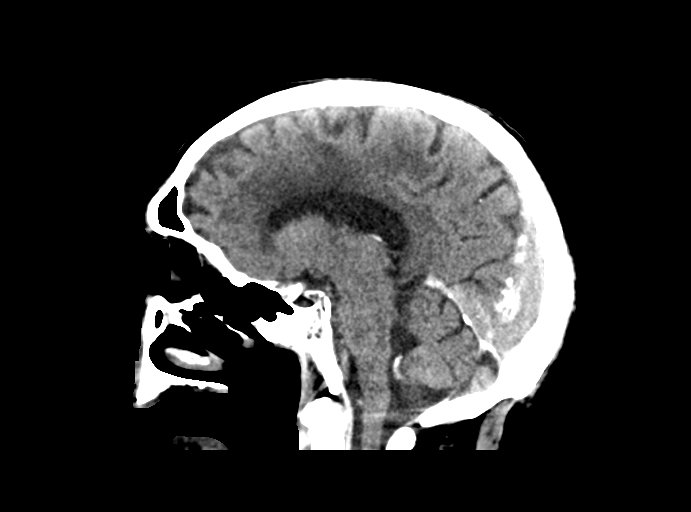
[im 43/65  brain]
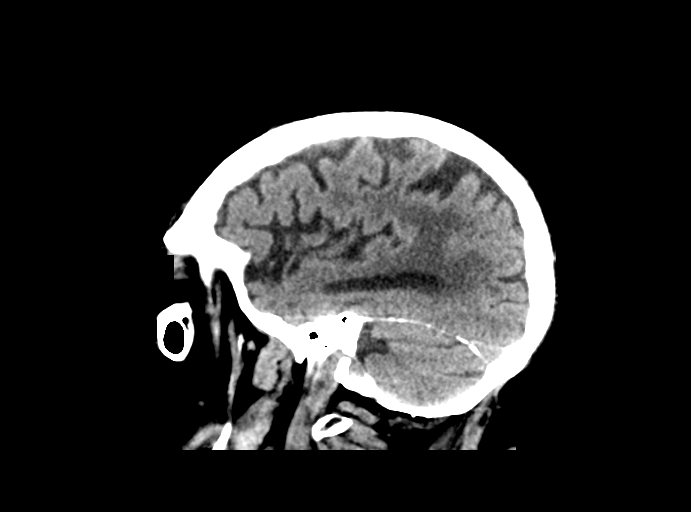

[14 of 47 positions shown; findings below may reference images not displayed]

FINDINGS: Brain: There is little change in ventricular dilatation and
prominent cortical sulci diffusely consistent with diffuse atrophy.
The septum is midline in position. Moderately severe small vessel
ischemic change throughout the periventricular white matter is
unchanged. No hemorrhage, mass lesion, or acute infarction is seen.

Vascular: No vascular abnormality is noted on this unenhanced study.

Skull: On bone window images, no calvarial abnormality is noted.

Sinuses/Orbits: There is evidence of right maxillary sinus disease
with diffuse mucosal thickening throughout the right maxillary sinus
and minimal mucosal thickening within the left maxillary sinus. No
air-fluid level is seen. Some mucosal thickening is also present
within the right partition of the sphenoid sinus. This may indicate
chronic sphenoid sinusitis in view of the prior CT.

Other: None.
IMPRESSION: 1. Diffuse atrophy and moderately severe small vessel ischemic
change throughout the periventricular white matter. No acute
intracranial abnormality.
2. Probable chronic sphenoid sinus disease with maxillary sinus
disease as well right greater than left. No air-fluid level is seen.
